# Patient Record
Sex: Female | Born: 1975
Health system: Southern US, Community
[De-identification: ages and names within clinical notes are randomized; demographics above are authoritative.]

## PROBLEM LIST (undated history)

## (undated) DIAGNOSIS — D649 Anemia, unspecified: Secondary | ICD-10-CM

## (undated) DIAGNOSIS — K219 Gastro-esophageal reflux disease without esophagitis: Secondary | ICD-10-CM

## (undated) DIAGNOSIS — O24419 Gestational diabetes mellitus in pregnancy, unspecified control: Secondary | ICD-10-CM

## (undated) DIAGNOSIS — J45909 Unspecified asthma, uncomplicated: Secondary | ICD-10-CM

## (undated) DIAGNOSIS — Z9109 Other allergy status, other than to drugs and biological substances: Secondary | ICD-10-CM

## (undated) DIAGNOSIS — E119 Type 2 diabetes mellitus without complications: Secondary | ICD-10-CM

## (undated) HISTORY — PX: THUMB FUSION: SUR636

## (undated) HISTORY — DX: Unspecified asthma, uncomplicated: J45.909

---

## 2001-08-13 HISTORY — PX: BREAST SURGERY: SHX581

## 2005-12-18 ENCOUNTER — Ambulatory Visit (HOSPITAL_COMMUNITY): Admission: RE | Admit: 2005-12-18 | Discharge: 2005-12-18 | Payer: Self-pay | Admitting: Obstetrics

## 2006-05-24 ENCOUNTER — Inpatient Hospital Stay (HOSPITAL_COMMUNITY): Admission: AD | Admit: 2006-05-24 | Discharge: 2006-05-27 | Payer: Self-pay | Admitting: Obstetrics

## 2006-05-24 ENCOUNTER — Inpatient Hospital Stay (HOSPITAL_COMMUNITY): Admission: AD | Admit: 2006-05-24 | Discharge: 2006-05-24 | Payer: Self-pay | Admitting: Obstetrics

## 2007-10-06 ENCOUNTER — Ambulatory Visit (HOSPITAL_COMMUNITY): Admission: RE | Admit: 2007-10-06 | Discharge: 2007-10-06 | Payer: Self-pay | Admitting: Obstetrics

## 2008-01-23 ENCOUNTER — Inpatient Hospital Stay (HOSPITAL_COMMUNITY): Admission: AD | Admit: 2008-01-23 | Discharge: 2008-01-25 | Payer: Self-pay | Admitting: Obstetrics

## 2011-05-10 LAB — CBC
HCT: 31.8 — ABNORMAL LOW
HCT: 37.9
Hemoglobin: 10.6 — ABNORMAL LOW
Hemoglobin: 12.7
MCHC: 33.2
MCV: 69.6 — ABNORMAL LOW
Platelets: 176
WBC: 6.1

## 2012-11-08 ENCOUNTER — Emergency Department (HOSPITAL_BASED_OUTPATIENT_CLINIC_OR_DEPARTMENT_OTHER): Payer: Worker's Compensation

## 2012-11-08 ENCOUNTER — Encounter (HOSPITAL_BASED_OUTPATIENT_CLINIC_OR_DEPARTMENT_OTHER): Payer: Self-pay | Admitting: *Deleted

## 2012-11-08 ENCOUNTER — Emergency Department (HOSPITAL_BASED_OUTPATIENT_CLINIC_OR_DEPARTMENT_OTHER)
Admission: EM | Admit: 2012-11-08 | Discharge: 2012-11-08 | Disposition: A | Discharge status: Home / Self Care | Payer: Worker's Compensation | Attending: Emergency Medicine | Admitting: Emergency Medicine

## 2012-11-08 DIAGNOSIS — S0093XA Contusion of unspecified part of head, initial encounter: Secondary | ICD-10-CM

## 2012-11-08 DIAGNOSIS — Y9389 Activity, other specified: Secondary | ICD-10-CM | POA: Insufficient documentation

## 2012-11-08 DIAGNOSIS — S5000XA Contusion of unspecified elbow, initial encounter: Secondary | ICD-10-CM | POA: Insufficient documentation

## 2012-11-08 DIAGNOSIS — S5002XA Contusion of left elbow, initial encounter: Secondary | ICD-10-CM

## 2012-11-08 DIAGNOSIS — Y9289 Other specified places as the place of occurrence of the external cause: Secondary | ICD-10-CM | POA: Insufficient documentation

## 2012-11-08 DIAGNOSIS — Y990 Civilian activity done for income or pay: Secondary | ICD-10-CM | POA: Insufficient documentation

## 2012-11-08 DIAGNOSIS — S0003XA Contusion of scalp, initial encounter: Secondary | ICD-10-CM | POA: Insufficient documentation

## 2012-11-08 DIAGNOSIS — W08XXXA Fall from other furniture, initial encounter: Secondary | ICD-10-CM | POA: Insufficient documentation

## 2012-11-08 MED ORDER — IBUPROFEN 800 MG PO TABS
ORAL_TABLET | ORAL | Status: AC
  Administered 2012-11-08: 800 mg
  Filled 2012-11-08: qty 1

## 2012-11-08 MED ORDER — IBUPROFEN 800 MG PO TABS: 800.0000 mg | ORAL_TABLET | Freq: Three times a day (TID) | ORAL | Status: DC

## 2012-11-08 NOTE — ED Provider Notes (Signed)
History     CSN: [id number]  Arrival date & time 11/08/12  1752   First MD Initiated Contact with Patient 11/08/12 1827      Chief Complaint  Patient presents with  . Fall    (Consider location/radiation/quality/duration/timing/severity/associated sxs/prior treatment) Patient is a 37 y.o. female presenting with fall. The history is provided by the patient. No language interpreter was used.  Fall The accident occurred less than 1 hour ago. The fall occurred from a stool. She fell from a height of 1 to 2 ft. She landed on a hard floor. There was no blood loss. The point of impact was the head and left elbow. The pain is present in the head and left elbow. The pain is at a severity of 4/10. The pain is mild. She was not ambulatory at the scene. There was no entrapment after the fall. Pertinent negatives include no visual change, no numbness, no nausea, no headaches, no hearing loss and no loss of consciousness. The symptoms are aggravated by activity. She has tried nothing for the symptoms.  Pt reports she fell off of a stool and hit her head.  No loc.    History reviewed. No pertinent past medical history.  History reviewed. No pertinent past surgical history.  History reviewed. No pertinent family history.  History  Substance Use Topics  . Smoking status: Never Smoker   . Smokeless tobacco: Not on file  . Alcohol Use: No    OB History   Grav Para Term Preterm Abortions TAB SAB Ect Mult Living                  Review of Systems  Gastrointestinal: Negative for nausea.  Neurological: Negative for loss of consciousness, numbness and headaches.  All other systems reviewed and are negative.    Allergies  Review of patient's allergies indicates not on file.  Home Medications  No current outpatient prescriptions on file.  BP 129/68  Pulse 60  Temp(Src) 98.3 F (36.8 C) (Oral)  Resp 20  Ht 5\' 7"  (1.702 m)  Wt 261 lb (118.389 kg)  BMI 40.87 kg/m2  SpO2 100%  LMP  10/18/2012  Physical Exam  Nursing note and vitals reviewed. Constitutional: She appears well-developed and well-nourished.  HENT:  Head: Normocephalic.  Right Ear: External ear normal.  Left Ear: External ear normal.  Eyes: Conjunctivae and EOM are normal. Pupils are equal, round, and reactive to light.  Neck: Normal range of motion. Neck supple.  Cardiovascular: Normal rate, regular rhythm and normal heart sounds.   Pulmonary/Chest: Effort normal and breath sounds normal.  Abdominal: Soft.  Musculoskeletal: She exhibits tenderness.  Tender right elbow.   From,  nv and ns intact  Neurological: She is alert.  Skin: Skin is warm.    ED Course  Procedures (including critical care time)  Labs Reviewed - No data to display Dg Elbow Complete Left  11/08/2012  *RADIOLOGY REPORT*  Clinical Data: Left elbow injury post fall, posterior pain  LEFT ELBOW - COMPLETE 3+ VIEW  Comparison: None.  Findings: Osseous mineralization normal. Joint spaces preserved. No acute fracture, dislocation or bone destruction. No elbow joint effusion. Regional soft tissues unremarkable.  IMPRESSION: No acute osseous abnormalities.   Original Report Authenticated By: Ulyses Southward, M.D.      No diagnosis found.    MDM  Pt advised of xray results.   Pt advised to see Dr. Pearletha Forge for recheck in 1 week.   Ice to areas of pain.  Pt given rx for ibuprofen         Elson Areas, [place] 11/08/12 2113

## 2012-11-08 NOTE — ED Notes (Signed)
Pt states she fell off a stool earlier today while at work and hit her head and back as well as her left elbow and right shoulder. No LOC. PERL

## 2012-11-09 NOTE — ED Provider Notes (Signed)
Medical screening examination/treatment/procedure(s) were performed by non-physician practitioner and as supervising physician I was immediately available for consultation/collaboration.   Carleene Cooper III, MD 11/09/12 [phone]

## 2015-11-15 ENCOUNTER — Encounter: Payer: Self-pay | Admitting: Obstetrics

## 2015-11-15 ENCOUNTER — Ambulatory Visit (HOSPITAL_COMMUNITY)
Admission: RE | Admit: 2015-11-15 | Discharge: 2015-11-15 | Disposition: A | Discharge status: Home / Self Care | Payer: 59 | Source: Ambulatory Visit | Attending: Obstetrics | Admitting: Obstetrics

## 2015-11-15 ENCOUNTER — Ambulatory Visit (INDEPENDENT_AMBULATORY_CARE_PROVIDER_SITE_OTHER): Payer: 59 | Admitting: Obstetrics

## 2015-11-15 VITALS — BP 111/80 | HR 84 | Temp 98.5°F | Wt 295.0 lb

## 2015-11-15 DIAGNOSIS — Z36 Encounter for antenatal screening of mother: Secondary | ICD-10-CM | POA: Diagnosis not present

## 2015-11-15 DIAGNOSIS — O3680X1 Pregnancy with inconclusive fetal viability, fetus 1: Secondary | ICD-10-CM

## 2015-11-15 DIAGNOSIS — Z3A01 Less than 8 weeks gestation of pregnancy: Secondary | ICD-10-CM | POA: Diagnosis not present

## 2015-11-15 DIAGNOSIS — O208 Other hemorrhage in early pregnancy: Secondary | ICD-10-CM | POA: Insufficient documentation

## 2015-11-15 NOTE — Progress Notes (Signed)
Patient is unsure of her LMP

## 2015-11-16 ENCOUNTER — Telehealth: Payer: Self-pay | Admitting: *Deleted

## 2015-11-16 LAB — BETA HCG QUANT (REF LAB): hCG Quant: 18875 m[IU]/mL

## 2015-11-16 LAB — SPECIMEN STATUS REPORT

## 2015-11-16 NOTE — Telephone Encounter (Signed)
Patient called and is requesting FMLA paperwork filled out for when she feels sick due to her pregnancy. She is complaining of dizziness associated with the pregnancy. LM on VM- will discuss with her provider- but should be able to cover some pregnancy illness during the month.

## 2015-11-17 ENCOUNTER — Other Ambulatory Visit: Payer: 59

## 2015-11-17 DIAGNOSIS — O3680X1 Pregnancy with inconclusive fetal viability, fetus 1: Secondary | ICD-10-CM

## 2015-11-17 LAB — PRENATAL PROFILE I(LABCORP)
ANTIBODY SCREEN: NEGATIVE
BASOS: 0 %
Basophils Absolute: 0 10*3/uL (ref 0.0–0.2)
EOS (ABSOLUTE): 0.1 10*3/uL (ref 0.0–0.4)
Eos: 1 %
HEMATOCRIT: 43.8 % (ref 34.0–46.6)
HEMOGLOBIN: 14.2 g/dL (ref 11.1–15.9)
Hepatitis B Surface Ag: NEGATIVE
Immature Grans (Abs): 0 10*3/uL (ref 0.0–0.1)
Immature Granulocytes: 0 %
Lymphocytes Absolute: 2.1 10*3/uL (ref 0.7–3.1)
Lymphs: 29 %
MCH: 22.6 pg — AB (ref 26.6–33.0)
MCHC: 32.4 g/dL (ref 31.5–35.7)
MCV: 70 fL — AB (ref 79–97)
MONOS ABS: 0.5 10*3/uL (ref 0.1–0.9)
Monocytes: 7 %
NEUTROS ABS: 4.4 10*3/uL (ref 1.4–7.0)
Neutrophils: 63 %
Platelets: 322 10*3/uL (ref 150–379)
RBC: 6.28 x10E6/uL — ABNORMAL HIGH (ref 3.77–5.28)
RDW: 14.7 % (ref 12.3–15.4)
RPR: NONREACTIVE
RUBELLA: 6.65 {index} (ref >0.99)
Rh Factor: POSITIVE
WBC: 7.1 10*3/uL (ref 3.4–10.8)

## 2015-11-17 LAB — HIV ANTIBODY (ROUTINE TESTING W REFLEX): HIV SCREEN 4TH GENERATION: NONREACTIVE

## 2015-11-17 LAB — VITAMIN D 25 HYDROXY (VIT D DEFICIENCY, FRACTURES): Vit D, 25-Hydroxy: 19.6 ng/mL — ABNORMAL LOW (ref 30.0–100.0)

## 2015-11-17 LAB — HEMOGLOBINOPATHY EVALUATION
HEMOGLOBIN A2 QUANTITATION: 4.6 % — AB (ref 0.7–3.1)
HGB A: 68.2 % — AB (ref 94.0–98.0)
HGB C: 0 %
HGB S: 27.2 % — ABNORMAL HIGH
Hemoglobin F Quantitation: 0 % (ref 0.0–2.0)

## 2015-11-17 LAB — VARICELLA ZOSTER ANTIBODY, IGG: VARICELLA: 847 {index} (ref >165)

## 2015-11-17 LAB — CULTURE, OB URINE

## 2015-11-17 LAB — URINE CULTURE, OB REFLEX

## 2015-11-18 ENCOUNTER — Other Ambulatory Visit: Payer: Self-pay | Admitting: Obstetrics

## 2015-11-18 LAB — BETA HCG QUANT (REF LAB): hCG Quant: 14680 m[IU]/mL

## 2015-11-21 ENCOUNTER — Encounter: Payer: Self-pay | Admitting: Obstetrics

## 2015-11-21 NOTE — Progress Notes (Signed)
Subjective:    Cheryl Acosta is being seen today for her first obstetrical visit.  This is a planned pregnancy. She is at [redacted]w[redacted]d gestation. Her obstetrical history is significant for advanced maternal age. Relationship with FOB: significant other, not living together. Patient does intend to breast feed. Pregnancy history fully reviewed.  The information documented in the HPI was reviewed and verified.  Menstrual History: OB History    Gravida Para Term Preterm AB TAB SAB Ectopic Multiple Living   3 2 2       2       Patient's last menstrual period was 08/23/2015.    History reviewed. No pertinent past medical history.  History reviewed. No pertinent past surgical history.   (Not in a hospital admission) No Known Allergies  Social History  Substance Use Topics  . Smoking status: Never Smoker   . Smokeless tobacco: Not on file  . Alcohol Use: No    History reviewed. No pertinent family history.   Review of Systems Constitutional: negative for weight loss Gastrointestinal: negative for vomiting Genitourinary:negative for genital lesions and vaginal discharge and dysuria Musculoskeletal:negative for back pain Behavioral/Psych: negative for abusive relationship, depression, illegal drug usage and tobacco use    Objective:    BP 111/80 mmHg  Pulse 84  Temp(Src) 98.5 F (36.9 C)  Wt 295 lb (133.811 kg)  LMP 08/23/2015 General Appearance:    Alert, cooperative, no distress, appears stated age  Head:    Normocephalic, without obvious abnormality, atraumatic  Eyes:    PERRL, conjunctiva/corneas clear, EOM's intact, fundi    benign, both eyes  Ears:    Normal TM's and external ear canals, both ears  Nose:   Nares normal, septum midline, mucosa normal, no drainage    or sinus tenderness  Throat:   Lips, mucosa, and tongue normal; teeth and gums normal  Neck:   Supple, symmetrical, trachea midline, no adenopathy;    thyroid:  no enlargement/tenderness/nodules; no carotid   bruit  or JVD  Back:     Symmetric, no curvature, ROM normal, no CVA tenderness  Lungs:     Clear to auscultation bilaterally, respirations unlabored  Chest Wall:    No tenderness or deformity   Heart:    Regular rate and rhythm, S1 and S2 normal, no murmur, rub   or gallop  Breast Exam:    No tenderness, masses, or nipple abnormality  Abdomen:     Soft, non-tender, bowel sounds active all four quadrants,    no masses, no organomegaly  Genitalia:    Normal female without lesion, discharge or tenderness  Extremities:   Extremities normal, atraumatic, no cyanosis or edema  Pulses:   2+ and symmetric all extremities  Skin:   Skin color, texture, turgor normal, no rashes or lesions  Lymph nodes:   Cervical, supraclavicular, and axillary nodes normal  Neurologic:   CNII-XII intact, normal strength, sensation and reflexes    throughout      Lab Review Urine pregnancy test Labs reviewed yes Radiologic studies reviewed yes Assessment:    Pregnancy at [redacted]w[redacted]d weeks    Plan:      Prenatal vitamins.  Counseling provided regarding continued use of seat belts, cessation of alcohol consumption, smoking or use of illicit drugs; infection precautions i.e., influenza/TDAP immunizations, toxoplasmosis,CMV, parvovirus, listeria and varicella; workplace safety, exercise during pregnancy; routine dental care, safe medications, sexual activity, hot tubs, saunas, pools, travel, caffeine use, fish and methlymercury, potential toxins, hair treatments, varicose veins Weight gain recommendations  per IOM guidelines reviewed: underweight/BMI< 18.5--> gain 28 - 40 lbs; normal weight/BMI 18.5 - 24.9--> gain 25 - 35 lbs; overweight/BMI 25 - 29.9--> gain 15 - 25 lbs; obese/BMI >30->gain  11 - 20 lbs Problem list reviewed and updated. FIRST/CF mutation testing/NIPT/QUAD SCREEN/fragile X/Ashkenazi Jewish population testing/Spinal muscular atrophy discussed: requested. Role of ultrasound in pregnancy discussed; fetal survey:  requested. Amniocentesis discussed: declined. VBAC calculator score: VBAC consent form provided No orders of the defined types were placed in this encounter.   Orders Placed This Encounter  Procedures  . Culture, OB Urine  . Result  . US OB Comp Less 14 Wks    Standing Status: Future     Number of Occurrences: 1     Standing Expiration Date: 01/14/2017    Order Specific Question:  Reason for Exam (SYMPTOM  OR DIAGNOSIS REQUIRED)    Answer:  Viability    Order Specific Question:  Preferred imaging location?    Answer:  [facility]  . US OB Transvaginal    Standing Status: Future     Number of Occurrences: 1     Standing Expiration Date: 01/14/2017    Order Specific Question:  Reason for Exam (SYMPTOM  OR DIAGNOSIS REQUIRED)    Answer:  viability    Order Specific Question:  Preferred imaging location?    Answer:  [facility] evaluation  . HIV antibody  . Varicella zoster antibody, IgG  . VITAMIN D 25 Hydroxy (Vit-D Deficiency, Fractures)  . Prenatal Profile I  . B-HCG Quant  . B-HCG Quant    Standing Status: Future     Number of Occurrences: 1     Standing Expiration Date: 11/14/2016  . Beta HCG, Quant  . Specimen status report    Follow up in 4 weeks.

## 2015-11-29 ENCOUNTER — Encounter: Payer: 59 | Admitting: Obstetrics

## 2015-11-30 ENCOUNTER — Ambulatory Visit (INDEPENDENT_AMBULATORY_CARE_PROVIDER_SITE_OTHER): Payer: 59 | Admitting: Obstetrics

## 2015-11-30 ENCOUNTER — Encounter: Payer: Self-pay | Admitting: Obstetrics

## 2015-11-30 VITALS — BP 113/80 | HR 70 | Temp 98.5°F | Wt 296.0 lb

## 2015-11-30 DIAGNOSIS — Z331 Pregnant state, incidental: Secondary | ICD-10-CM | POA: Diagnosis not present

## 2015-11-30 DIAGNOSIS — Z3481 Encounter for supervision of other normal pregnancy, first trimester: Secondary | ICD-10-CM | POA: Diagnosis not present

## 2015-11-30 DIAGNOSIS — O3680X1 Pregnancy with inconclusive fetal viability, fetus 1: Secondary | ICD-10-CM

## 2015-11-30 DIAGNOSIS — Z1389 Encounter for screening for other disorder: Secondary | ICD-10-CM

## 2015-11-30 LAB — POCT URINALYSIS DIPSTICK
BILIRUBIN UA: NEGATIVE
Glucose, UA: NEGATIVE
KETONES UA: NEGATIVE
Nitrite, UA: NEGATIVE
PROTEIN UA: NEGATIVE
SPEC GRAV UA: 1.02
Urobilinogen, UA: NEGATIVE
pH, UA: 5

## 2015-11-30 NOTE — Progress Notes (Signed)
Pt states that her LMP of 09-23-15, not 08-23-15 as previously noted.

## 2015-11-30 NOTE — Progress Notes (Signed)
  Subjective:    Cheryl Acosta is a 40 y.o. female being seen today for her obstetrical visit. She is at [redacted]w[redacted]d gestation. Patient reports: no complaints.  Problem List Items Addressed This Visit    None    Visit Diagnoses    Encounter for supervision of other normal pregnancy in first trimester    -  Primary    Relevant Orders    POCT urinalysis dipstick (Completed)    Encounter to determine fetal viability of pregnancy, fetus 1        Relevant Orders    US OB Transvaginal    US OB Follow Up      There are no active problems to display for this patient.   Objective:     BP 113/80 mmHg  Pulse 70  Temp(Src) 98.5 F (36.9 C)  Wt 296 lb (134.265 kg)  LMP 09/23/2015 Uterine Size: Below umbilicus     Assessment:    Pregnancy @ [redacted]w[redacted]d  weeks   Plan:    Problem list reviewed and updated. Labs reviewed. Ultrasound ordered  Follow up in 1 weeks.  Role of ultrasound in pregnancy discussed; fetal survey: Requested

## 2015-12-01 ENCOUNTER — Ambulatory Visit (HOSPITAL_COMMUNITY)
Admission: RE | Admit: 2015-12-01 | Discharge: 2015-12-01 | Disposition: A | Discharge status: Home / Self Care | Payer: 59 | Source: Ambulatory Visit | Attending: Obstetrics | Admitting: Obstetrics

## 2015-12-01 ENCOUNTER — Ambulatory Visit (INDEPENDENT_AMBULATORY_CARE_PROVIDER_SITE_OTHER): Payer: 59 | Admitting: Obstetrics

## 2015-12-01 VITALS — Wt 296.0 lb

## 2015-12-01 DIAGNOSIS — Z36 Encounter for antenatal screening of mother: Secondary | ICD-10-CM | POA: Diagnosis present

## 2015-12-01 DIAGNOSIS — Z3169 Encounter for other general counseling and advice on procreation: Secondary | ICD-10-CM

## 2015-12-01 DIAGNOSIS — O021 Missed abortion: Secondary | ICD-10-CM | POA: Diagnosis not present

## 2015-12-01 DIAGNOSIS — O3680X1 Pregnancy with inconclusive fetal viability, fetus 1: Secondary | ICD-10-CM

## 2015-12-01 DIAGNOSIS — Z3A01 Less than 8 weeks gestation of pregnancy: Secondary | ICD-10-CM | POA: Diagnosis not present

## 2015-12-01 MED ORDER — VITAFOL-NANO 18-0.6-0.4 MG PO TABS: 1.0000 | ORAL_TABLET | Freq: Every day | ORAL | Status: DC

## 2015-12-01 NOTE — Progress Notes (Signed)
Patient had viability U/S. Here to review with Dr. Jodi Mourning

## 2015-12-02 ENCOUNTER — Encounter: Payer: Self-pay | Admitting: Obstetrics

## 2015-12-02 NOTE — Progress Notes (Signed)
S:  Patient returns for results of ultrasound O:  Ultrasound reveals ~ 6 week IUP with absent cardiac activity and growth 15 days apart  A:  Failed IUP. P:  Discussed options, including D&C, Cytotec and expectant management and patient desires expectant management.      F/U in 2 weeks  Charles A. Jodi Mourning MD

## 2015-12-05 ENCOUNTER — Other Ambulatory Visit: Payer: Self-pay | Admitting: *Deleted

## 2015-12-05 ENCOUNTER — Telehealth: Payer: Self-pay

## 2015-12-05 ENCOUNTER — Encounter (HOSPITAL_COMMUNITY): Payer: Self-pay | Admitting: *Deleted

## 2015-12-05 NOTE — Telephone Encounter (Signed)
LET PATIENT KNOW THAT HER FMLA PAPERWORK IS READY AND THERE IS A 15.00 CHARGE - SHE CAN PAY OVER PHONE OR PAY WHEN SHE PICKS UP

## 2015-12-07 ENCOUNTER — Telehealth: Payer: Self-pay

## 2015-12-07 DIAGNOSIS — Z029 Encounter for administrative examinations, unspecified: Secondary | ICD-10-CM

## 2015-12-07 NOTE — Telephone Encounter (Signed)
PATIENT PAID FOR FMLA PAPERWORK OVER PHONE - FAXED TO HER JOB  TODAY

## 2015-12-12 ENCOUNTER — Encounter (HOSPITAL_COMMUNITY): Payer: Self-pay | Admitting: *Deleted

## 2015-12-12 ENCOUNTER — Ambulatory Visit (HOSPITAL_COMMUNITY): Payer: 59 | Admitting: Anesthesiology

## 2015-12-12 ENCOUNTER — Encounter (HOSPITAL_COMMUNITY)
Admission: AD | Disposition: A | Discharge status: Home / Self Care | Payer: Self-pay | Source: Ambulatory Visit | Attending: Obstetrics

## 2015-12-12 ENCOUNTER — Ambulatory Visit (HOSPITAL_COMMUNITY)
Admission: AD | Admit: 2015-12-12 | Discharge: 2015-12-12 | Disposition: A | Discharge status: Home / Self Care | Payer: 59 | Source: Ambulatory Visit | Attending: Obstetrics | Admitting: Obstetrics

## 2015-12-12 DIAGNOSIS — Z6841 Body Mass Index (BMI) 40.0 and over, adult: Secondary | ICD-10-CM | POA: Diagnosis not present

## 2015-12-12 DIAGNOSIS — O021 Missed abortion: Secondary | ICD-10-CM | POA: Insufficient documentation

## 2015-12-12 HISTORY — PX: DILATION AND EVACUATION: SHX1459

## 2015-12-12 SURGERY — DILATION AND EVACUATION, UTERUS
Anesthesia: Monitor Anesthesia Care | Site: Vagina

## 2015-12-12 MED ORDER — DEXAMETHASONE SODIUM PHOSPHATE 10 MG/ML IJ SOLN
INTRAMUSCULAR | Status: AC
  Filled 2015-12-12: qty 1

## 2015-12-12 MED ORDER — MIDAZOLAM HCL 2 MG/2ML IJ SOLN
INTRAMUSCULAR | Status: DC | PRN
  Administered 2015-12-12: 2 mg via INTRAVENOUS

## 2015-12-12 MED ORDER — FENTANYL CITRATE (PF) 100 MCG/2ML IJ SOLN
INTRAMUSCULAR | Status: AC
  Filled 2015-12-12: qty 2

## 2015-12-12 MED ORDER — ONDANSETRON HCL 4 MG/2ML IJ SOLN
INTRAMUSCULAR | Status: DC | PRN
  Administered 2015-12-12: 4 mg via INTRAVENOUS

## 2015-12-12 MED ORDER — GLYCOPYRROLATE 0.2 MG/ML IJ SOLN
INTRAMUSCULAR | Status: AC
  Filled 2015-12-12: qty 1

## 2015-12-12 MED ORDER — PROPOFOL 10 MG/ML IV BOLUS
INTRAVENOUS | Status: AC
  Filled 2015-12-12: qty 20

## 2015-12-12 MED ORDER — KETOROLAC TROMETHAMINE 30 MG/ML IJ SOLN
INTRAMUSCULAR | Status: DC | PRN
  Administered 2015-12-12: 30 mg via INTRAVENOUS

## 2015-12-12 MED ORDER — IBUPROFEN 800 MG PO TABS: 800.0000 mg | ORAL_TABLET | Freq: Three times a day (TID) | ORAL | Status: DC | PRN

## 2015-12-12 MED ORDER — PROPOFOL 500 MG/50ML IV EMUL: INTRAVENOUS | Status: DC | PRN

## 2015-12-12 MED ORDER — LIDOCAINE HCL (CARDIAC) 20 MG/ML IV SOLN
INTRAVENOUS | Status: AC
  Filled 2015-12-12: qty 5

## 2015-12-12 MED ORDER — OXYTOCIN 40 UNITS IN LACTATED RINGERS INFUSION - SIMPLE MED
INTRAVENOUS | Status: DC | PRN
  Administered 2015-12-12: 40 mL via INTRAVENOUS

## 2015-12-12 MED ORDER — FENTANYL CITRATE (PF) 100 MCG/2ML IJ SOLN
INTRAMUSCULAR | Status: DC | PRN
  Administered 2015-12-12: 100 ug via INTRAVENOUS

## 2015-12-12 MED ORDER — ONDANSETRON HCL 4 MG/2ML IJ SOLN: 4.0000 mg | Freq: Once | INTRAMUSCULAR | Status: DC

## 2015-12-12 MED ORDER — PROPOFOL 500 MG/50ML IV EMUL
INTRAVENOUS | Status: DC | PRN
  Administered 2015-12-12: 100 mg via INTRAVENOUS

## 2015-12-12 MED ORDER — FENTANYL CITRATE (PF) 100 MCG/2ML IJ SOLN
25.0000 ug | INTRAMUSCULAR | Status: DC | PRN
  Administered 2015-12-12 (×2): 25 ug via INTRAVENOUS
  Administered 2015-12-12: 50 ug via INTRAVENOUS

## 2015-12-12 MED ORDER — GLYCOPYRROLATE 0.2 MG/ML IJ SOLN
INTRAMUSCULAR | Status: DC | PRN
  Administered 2015-12-12: 0.2 mg via INTRAVENOUS

## 2015-12-12 MED ORDER — OXYTOCIN 10 UNIT/ML IJ SOLN
INTRAMUSCULAR | Status: AC
  Filled 2015-12-12: qty 4

## 2015-12-12 MED ORDER — LIDOCAINE HCL (CARDIAC) 20 MG/ML IV SOLN
INTRAVENOUS | Status: DC | PRN
  Administered 2015-12-12: 50 mg via INTRAVENOUS

## 2015-12-12 MED ORDER — SCOPOLAMINE 1 MG/3DAYS TD PT72: 1.0000 | MEDICATED_PATCH | Freq: Once | TRANSDERMAL | Status: DC

## 2015-12-12 MED ORDER — MIDAZOLAM HCL 2 MG/2ML IJ SOLN
INTRAMUSCULAR | Status: AC
  Filled 2015-12-12: qty 2

## 2015-12-12 MED ORDER — OXYTOCIN 10 UNIT/ML IJ SOLN: INTRAMUSCULAR | Status: DC | PRN

## 2015-12-12 MED ORDER — LACTATED RINGERS IV SOLN
INTRAVENOUS | Status: DC
  Administered 2015-12-12 (×2): via INTRAVENOUS

## 2015-12-12 MED ORDER — SILVER NITRATE-POT NITRATE 75-25 % EX MISC
CUTANEOUS | Status: DC | PRN
  Administered 2015-12-12: 2

## 2015-12-12 MED ORDER — PROPOFOL 500 MG/50ML IV EMUL
INTRAVENOUS | Status: DC | PRN
  Administered 2015-12-12: 100 ug/kg/min via INTRAVENOUS

## 2015-12-12 MED ORDER — LIDOCAINE HCL 1 % IJ SOLN
INTRAMUSCULAR | Status: AC
  Filled 2015-12-12: qty 20

## 2015-12-12 MED ORDER — ONDANSETRON HCL 4 MG/2ML IJ SOLN
INTRAMUSCULAR | Status: AC
  Filled 2015-12-12: qty 2

## 2015-12-12 MED ORDER — LIDOCAINE HCL 1 % IJ SOLN
INTRAMUSCULAR | Status: DC | PRN
  Administered 2015-12-12: 20 mL

## 2015-12-12 SURGICAL SUPPLY — 20 items
CATH ROBINSON RED A/P 16FR (CATHETERS) ×3 IMPLANT
CLOTH BEACON ORANGE TIMEOUT ST (SAFETY) ×3 IMPLANT
DECANTER SPIKE VIAL GLASS SM (MISCELLANEOUS) ×3 IMPLANT
GLOVE BIO SURGEON STRL SZ8 (GLOVE) ×6 IMPLANT
GLOVE BIOGEL PI IND STRL 7.0 (GLOVE) ×1 IMPLANT
GLOVE BIOGEL PI INDICATOR 7.0 (GLOVE) ×2
GOWN STRL REUS W/TWL LRG LVL3 (GOWN DISPOSABLE) ×6 IMPLANT
KIT BERKELEY 1ST TRIMESTER 3/8 (MISCELLANEOUS) ×3 IMPLANT
NEEDLE HYPO 22GX1.5 SAFETY (NEEDLE) ×3 IMPLANT
NS IRRIG 1000ML POUR BTL (IV SOLUTION) ×3 IMPLANT
PACK VAGINAL MINOR WOMEN LF (CUSTOM PROCEDURE TRAY) ×3 IMPLANT
PAD OB MATERNITY 4.3X12.25 (PERSONAL CARE ITEMS) ×3 IMPLANT
PAD PREP 24X48 CUFFED NSTRL (MISCELLANEOUS) ×3 IMPLANT
SET BERKELEY SUCTION TUBING (SUCTIONS) ×3 IMPLANT
TOWEL OR 17X24 6PK STRL BLUE (TOWEL DISPOSABLE) ×6 IMPLANT
VACURETTE 10 RIGID CVD (CANNULA) IMPLANT
VACURETTE 12 RIGID CVD (CANNULA) ×3 IMPLANT
VACURETTE 7MM CVD STRL WRAP (CANNULA) IMPLANT
VACURETTE 8 RIGID CVD (CANNULA) ×2 IMPLANT
VACURETTE 9 RIGID CVD (CANNULA) IMPLANT

## 2015-12-12 NOTE — Op Note (Signed)
Preoperative diagnosis:Missed Abortion, 1st Trimester  Postoperative diagnosis: Same  Procedure: Suction dilatation and curretage Surgeon: Lindzie Boxx A  Anesthesia:  MAC with Paracervical Block of 1% Xylocaine   Estimated blood loss: 174ml  Urine output: 46ml ( clear )  IV Fluids: 123XX123  Complications: None  Specimen: PATHOLOGY - POC  Operative Findings: POC  Suction D&C  Description of procedure:   The patient was taken to the operating room and placed on the operating table in the semi-lithotomy position in [place].  Examination under anesthesia was performed.  The patient was prepped and draped in the usual manner.  After a time-out had been completed, a speculum was placed in the vagina.  The anterior lip of the cervix was grasped with a single-toothed tenaculum.  A paracervical block was performed using 10 ml of 1% lidocaine.  The block was performed at 4 and 8 o'clock at the cervical vaginal junction. The cervix was dilated to 69mm with [facility] dilators.  A 8 -mm suction curet was inserted in the uterine cavity.  The device was activated and the curet rotated to evacuate the products of conception.   All the instruments were removed from the vagina.  Final instrument counts were correct.  The patient was taken to the PACU in stable condition.

## 2015-12-12 NOTE — Anesthesia Preprocedure Evaluation (Signed)
Anesthesia Evaluation  Patient identified by MRN, date of birth, ID band Patient awake    Reviewed: Allergy & Precautions, NPO status , Patient's Chart, lab work & pertinent test results  Airway Mallampati: III  TM Distance: >3 FB Neck ROM: Full    Dental no notable dental hx. (+) Teeth Intact   Pulmonary  Reactive airways   Pulmonary exam normal breath sounds clear to auscultation       Cardiovascular negative cardio ROS Normal cardiovascular exam Rhythm:Regular Rate:Normal     Neuro/Psych negative neurological ROS  negative psych ROS   GI/Hepatic negative GI ROS, Neg liver ROS,   Endo/Other  Morbid obesity  Renal/GU negative Renal ROS  negative genitourinary   Musculoskeletal negative musculoskeletal ROS (+)   Abdominal (+) + obese,   Peds  Hematology   Anesthesia Other Findings   Reproductive/Obstetrics (+) Pregnancy Missed Ab                             Anesthesia Physical Anesthesia Plan  ASA: III  Anesthesia Plan: MAC   Post-op Pain Management:    Induction:   Airway Management Planned: Natural Airway and Nasal Cannula  Additional Equipment:   Intra-op Plan:   Post-operative Plan:   Informed Consent: I have reviewed the patients History and Physical, chart, labs and discussed the procedure including the risks, benefits and alternatives for the proposed anesthesia with the patient or authorized representative who has indicated his/her understanding and acceptance.   Dental advisory given  Plan Discussed with: CRNA, Anesthesiologist and Surgeon  Anesthesia Plan Comments:         Anesthesia Quick Evaluation

## 2015-12-12 NOTE — H&P (Signed)
Cheryl Acosta is an 40 y.o. female. Ultrasound findings c/w Missed Abortion.  Patient presents for D&E.  Pertinent Gynecological History: Menses: flow is moderate Bleeding: normal Contraception: none DES exposure: denies Blood transfusions: none Sexually transmitted diseases: no past history Previous GYN Procedures: none  Last mammogram: none Date: none Last pap: normal Date: 2016 OB History: G3, P2   Menstrual History: Menarche age: 84  Patient's last menstrual period was 09/23/2015.    History reviewed. No pertinent past medical history.  Past Surgical History  Procedure Laterality Date  . Thumb fusion    . Breast surgery  2003    BILATERAL LUMPECTOMY     History reviewed. No pertinent family history.  Social History:  reports that she has never smoked. She does not have any smokeless tobacco history on file. She reports that she does not drink alcohol or use illicit drugs.  Allergies: No Known Allergies  Prescriptions prior to admission  Medication Sig Dispense Refill Last Dose  . acetaminophen (TYLENOL) 500 MG tablet Take 1,000 mg by mouth every 6 (six) hours as needed for mild pain or headache.     . albuterol (PROVENTIL HFA;VENTOLIN HFA) 108 (90 Base) MCG/ACT inhaler Inhale 2 puffs into the lungs every 6 (six) hours as needed for wheezing or shortness of breath.   rescue  . Chlorpheniramine Maleate (ALLERGY PO) Take 1 tablet by mouth daily as needed (allergies).       Review of Systems  All other systems reviewed and are negative.   Blood pressure 139/79, pulse 78, temperature 98.1 F (36.7 C), temperature source Oral, resp. rate 20, height 5\' 8"  (1.727 m), weight 296 lb (134.265 kg), last menstrual period 09/23/2015, SpO2 100 %. Physical Exam  Nursing note and vitals reviewed. Constitutional: She is oriented to person, place, and time. She appears well-developed and well-nourished.  HENT:  Head: Normocephalic and atraumatic.  Eyes: Conjunctivae are normal.  Pupils are equal, round, and reactive to light.  Neck: Normal range of motion. Neck supple.  Cardiovascular: Normal rate and regular rhythm.   Respiratory: Effort normal and breath sounds normal.  GI: Soft.  Genitourinary: Vagina normal and uterus normal.  Musculoskeletal: Normal range of motion.  Neurological: She is alert and oriented to person, place, and time.  Skin: Skin is warm and dry.  Psychiatric: She has a normal mood and affect. Her behavior is normal. Judgment and thought content normal.    No results found for this or any previous visit (from the past 24 hour(s)).  No results found.  Assessment/Plan: Missed Abortion.  Patient offered options of Cytotec, D&C or expectant management.  Patient elects to have D&C.  HARPER,CHARLES A 12/12/2015, 7:47 AM

## 2015-12-12 NOTE — Transfer of Care (Signed)
Immediate Anesthesia Transfer of Care Note  Patient: Cheryl Acosta  Procedure(s) Performed: Procedure(s): DILATATION AND EVACUATION (N/A)  Patient Location: PACU  Anesthesia Type:MAC  Level of Consciousness: awake, alert  and oriented  Airway & Oxygen Therapy: Patient Spontanous Breathing and Patient connected to nasal cannula oxygen  Post-op Assessment: Report given to RN, Post -op Vital signs reviewed and stable and Patient moving all extremities X 4  Post vital signs: Reviewed and stable  Last Vitals:  Filed Vitals:   12/12/15 0724 12/12/15 0940  BP: 139/79   Pulse:    Temp:  36.7 C  Resp:      Last Pain: There were no vitals filed for this visit.    Patients Stated Pain Goal: 3 ([id number])  Complications: No apparent anesthesia complications

## 2015-12-12 NOTE — Discharge Instructions (Signed)
D&C or Vacuum Curettage Care After Read the instructions below. Refer to this sheet in the next few weeks. These instructions provide you with general information on caring for yourself after you leave the hospital. Your caregiver may also give you specific instructions.  D&C or vacuum curettage is a minor operation. A D&C involves the stretching (dilatation) of the cervix and scraping (curettage) of the inside lining of the uterus. A vacuum curettage gently sucks out the lining and tissue in the uterus with a tube. You may have light cramping and bleeding for a couple of days to two weeks after the procedure. This procedure may be done in a hospital, outpatient clinic, or doctor's office. You may be given a drug to make you sleep (general anesthetic) or a drug that numbs the area (local anesthetic) in and around the cervix. HOME CARE INSTRUCTIONS  Do not drive for 24 hours.   Wait one week before returning to strenuous activities.   Take your temperature two times a day for 4 days and write it down. Provide these temperatures to your caregiver if they are abnormal (above 98.6 F or 37.0 C).   Avoid long periods of standing, and do no heavy lifting (more than 10 pounds), pushing or pulling.   Limit stair climbing to once or twice a day.   Take rest periods often.   You may resume your usual diet.   Drink plenty of fluids (6-8 glasses a day).   You should return to your usual bowel function. If constipation should occur, you may:   Take a mild laxative with permission from your caregiver.   Add fruit and bran to your diet.   Drink more fluids. This helps with constipation.   Take showers instead of baths until your caregiver gives you permission to take baths.   Do not go swimming or use a hot tub until your caregiver gives you permission.   Try to have someone with you or available for you the first 24 to 48 hours, especially if you had a general anesthetic.   Do not douche, use  tampons, or have intercourse until after your follow-up appointment, or when your caregiver approves.   Only take over-the-counter or prescription medicines for pain, discomfort, or fever as directed by your caregiver. Do not take aspirin. It can cause bleeding.   If a prescription was given, follow your caregiver's directions. You may be given a medicine that kills germs (antibiotic) to prevent an infection.   Keep all your follow-up appointments recommended by your caregiver.  SEEK MEDICAL CARE IF:  You have increasing cramps or pain not relieved with medication.   You develop belly (abdominal) pain which does not seem to be related to the same area of earlier cramping and pain.   You feel dizzy or feel like fainting.   You have bad smelling vaginal discharge.   You develop a rash.   You develop a reaction or allergy to your medication.  SEEK IMMEDIATE MEDICAL CARE IF:  Bleeding is heavier than a normal menstrual period.   You have an oral temperature above 100.6, not controlled by medicine.   You develop chest pain.   You develop shortness of breath.   You pass out.   You develop pain in your shoulder strap area.   You develop heavy vaginal bleeding with or without blood clots.  MAKE SURE YOU:   Understand these instructions.   Will watch your condition.   Will get help right away if  you are not doing well or get worse.  UPDATED HEALTH PRACTICES  A PAP smear is done to screen for cervical cancer.   The first PAP smear should be done at age 11.   Between ages 39 and 57, PAP smears are repeated every 2 years.   Beginning at age 20, you are advised to have a PAP smear every 3 years as long as your past 3 PAP smears have been normal.   Some women have medical problems that increase the chance of getting cervical cancer. Talk to your caregiver about these problems. It is especially important to talk to your caregiver if a new problem develops soon after your last PAP  smear. In these cases, your caregiver may recommend more frequent screening and Pap smears.   The above recommendations are the same for women who have or have not gotten the vaccine for HPV (Human Papillomavirus).   If you had a hysterectomy for a problem that was not a cancer or a condition that could lead to cancer, then you no longer need Pap smears.   If you are between ages 29 and 25, and you have had normal Pap smears going back 10 years, you no longer need Pap smears.   If you have had past treatment for cervical cancer or a condition that could lead to cancer, you need Pap smears and screening for cancer for at least 20 years after your treatment.   Continue monthly self-breast examinations. Your caregiver can provide information and instructions for self-breast examination.  Document Released: 07/27/2000 Document Re-Released: 01/17/2010 [facility] Patient Information 2011 [place].  May take ibuprofen starting 3:30pm 12/12/15.  Recovery unit [phone]

## 2015-12-13 ENCOUNTER — Encounter (HOSPITAL_COMMUNITY): Payer: Self-pay | Admitting: Obstetrics

## 2015-12-13 NOTE — Anesthesia Postprocedure Evaluation (Signed)
Anesthesia Post Note  Patient: Cheryl Acosta  Procedure(s) Performed: Procedure(s) (LRB): DILATATION AND EVACUATION (N/A)  Patient location during evaluation: PACU Anesthesia Type: General Level of consciousness: awake and alert and oriented Pain management: pain level controlled Vital Signs Assessment: post-procedure vital signs reviewed and stable Respiratory status: spontaneous breathing, nonlabored ventilation and respiratory function stable Cardiovascular status: blood pressure returned to baseline and stable Postop Assessment: no signs of nausea or vomiting Anesthetic complications: no    Last Vitals:  Filed Vitals:   12/12/15 1100 12/12/15 1145  BP: 125/76 114/72  Pulse: 69 70  Temp: 36.6 C 36.7 C  Resp: 18 20    Last Pain:  Filed Vitals:   12/12/15 1206  PainSc: 0-No pain                 Kailee Essman A.

## 2015-12-14 ENCOUNTER — Encounter: Payer: Self-pay | Admitting: *Deleted

## 2015-12-15 ENCOUNTER — Encounter: Payer: 59 | Admitting: Obstetrics

## 2015-12-26 ENCOUNTER — Encounter: Payer: Self-pay | Admitting: Obstetrics

## 2015-12-26 ENCOUNTER — Ambulatory Visit (INDEPENDENT_AMBULATORY_CARE_PROVIDER_SITE_OTHER): Payer: 59 | Admitting: Obstetrics

## 2015-12-26 ENCOUNTER — Encounter: Payer: Self-pay | Admitting: *Deleted

## 2015-12-26 VITALS — BP 146/89 | HR 73 | Temp 98.7°F | Wt 301.0 lb

## 2015-12-26 DIAGNOSIS — M545 Low back pain, unspecified: Secondary | ICD-10-CM

## 2015-12-26 DIAGNOSIS — O021 Missed abortion: Secondary | ICD-10-CM

## 2015-12-26 DIAGNOSIS — R300 Dysuria: Secondary | ICD-10-CM | POA: Diagnosis not present

## 2015-12-26 DIAGNOSIS — Z3169 Encounter for other general counseling and advice on procreation: Secondary | ICD-10-CM

## 2015-12-26 LAB — POCT URINALYSIS DIPSTICK
Bilirubin, UA: NEGATIVE
Blood, UA: NEGATIVE
Glucose, UA: NEGATIVE
NITRITE UA: NEGATIVE
Spec Grav, UA: <=1.005
UROBILINOGEN UA: NEGATIVE
pH, UA: 8

## 2015-12-26 MED ORDER — IBUPROFEN 800 MG PO TABS: 800.0000 mg | ORAL_TABLET | Freq: Three times a day (TID) | ORAL | Status: DC | PRN

## 2015-12-26 MED ORDER — PRENATAL PLUS IRON 29-1 MG PO TABS: 1.0000 | ORAL_TABLET | Freq: Every day | ORAL | Status: DC

## 2015-12-26 MED ORDER — CYCLOBENZAPRINE HCL 10 MG PO TABS: 10.0000 mg | ORAL_TABLET | Freq: Three times a day (TID) | ORAL | Status: DC | PRN

## 2015-12-26 NOTE — Progress Notes (Signed)
Patient ID: Cheryl Acosta, female   DOB: 25-Dec-1975, 40 y.o.   MRN: [id number]  Chief Complaint  Patient presents with  . Follow-up    Missed AB   . Back Pain    Dull Aching lower back pain -   . Dizziness    W/ back pain     HPI Cheryl Acosta is a 40 y.o. female.  C/O lower back pain.  HPI  No past medical history on file.  Past Surgical History  Procedure Laterality Date  . Thumb fusion    . Breast surgery  2003    BILATERAL LUMPECTOMY   . Dilation and evacuation N/A 12/12/2015    Procedure: DILATATION AND EVACUATION;  Surgeon: Shelly Bombard, MD;  Location: [facility];  Service: Gynecology;  Laterality: N/A;    No family history on file.  Social History Social History  Substance Use Topics  . Smoking status: Never Smoker   . Smokeless tobacco: None  . Alcohol Use: No    No Known Allergies  Current Outpatient Prescriptions  Medication Sig Dispense Refill  . acetaminophen (TYLENOL) 500 MG tablet Take 1,000 mg by mouth every 6 (six) hours as needed for mild pain or headache. Reported on 12/26/2015    . albuterol (PROVENTIL HFA;VENTOLIN HFA) 108 (90 Base) MCG/ACT inhaler Inhale 2 puffs into the lungs every 6 (six) hours as needed for wheezing or shortness of breath.    . Chlorpheniramine Maleate (ALLERGY PO) Take 1 tablet by mouth daily as needed (allergies).    [name] ibuprofen (ADVIL,MOTRIN) 800 MG tablet Take 1 tablet (800 mg total) by mouth every 8 (eight) hours as needed. 30 tablet 5  . Prenatal Vit-Iron Carbonyl-FA (PRENATAL PLUS IRON) 29-1 MG TABS Take 1 tablet by mouth daily before breakfast. 30 tablet 11   No current facility-administered medications for this visit.    Review of Systems Review of Systems Constitutional: negative for fatigue and weight loss Respiratory: negative for cough and wheezing Cardiovascular: negative for chest pain, fatigue and palpitations Gastrointestinal: negative for abdominal pain and change in bowel  habits Genitourinary:negative Integument/breast: negative for nipple discharge Musculoskeletal:positive for myalgias Neurological: negative for gait problems and tremors Behavioral/Psych: negative for abusive relationship, depression Endocrine: negative for temperature intolerance     Blood pressure 146/89, pulse 73, temperature 98.7 F (37.1 C), temperature source Oral, weight 301 lb (136.533 kg), last menstrual period 09/23/2015, unknown if currently breastfeeding.  Physical Exam Physical Exam            General:  Alert and no distress Abdomen:  normal findings: no organomegaly, soft, non-tender and no hernia  Pelvis:  External genitalia: normal general appearance Urinary system: urethral meatus normal and bladder without fullness, nontender Vaginal: normal without tenderness, induration or masses Cervix: normal appearance Adnexa: normal bimanual exam Uterus: anteverted and non-tender, normal size      Data Reviewed Pathology  Assessment     Missed Abortion.  S/P D&E.  Doing well.  Preconception counseling and advice. Backache    Plan    PNV's Rx  Flexeril Rx F/U 6 months.   Orders Placed This Encounter  Procedures  . POCT Urinalysis Dipstick   Meds ordered this encounter  Medications  . Prenatal Vit-Iron Carbonyl-FA (PRENATAL PLUS IRON) 29-1 MG TABS    Sig: Take 1 tablet by mouth daily before breakfast.    Dispense:  30 tablet    Refill:  11

## 2015-12-27 ENCOUNTER — Encounter: Payer: Self-pay | Admitting: Obstetrics

## 2015-12-28 LAB — URINE CULTURE

## 2016-01-02 ENCOUNTER — Ambulatory Visit: Payer: 59 | Admitting: Obstetrics

## 2016-01-03 ENCOUNTER — Institutional Professional Consult (permissible substitution): Payer: 59

## 2016-06-25 ENCOUNTER — Ambulatory Visit: Payer: Self-pay | Admitting: Obstetrics

## 2016-08-13 NOTE — L&D Delivery Note (Signed)
Patient is 41 y.o. [id number] [redacted]w[redacted]d admitted IOL GDMA1 and LGA ([redacted]w[redacted]d: 3757g, EFW>90% AC>97%, HC:AC 0.8). S/p IOL with foley bulb, cytotec, followed by Pitocin. SROM @2240  then AROM forebag @0013 .  Prenatal course also complicated by single elevated BP intrapartum.  Delivery Note At 12:41 AM a viable female was delivered via Vaginal, Spontaneous Delivery (Presentation: LOA with compound presentation of posterior arm).  APGAR: 7, 9; weight pending.   Placenta status: intact with 3 vessels.  Cord: tight nuchal x1 - delivered baby through cord.  Cord pH: N/A  Anesthesia:  Epidural Episiotomy: None Lacerations: 2nd degree;Perineal Suture Repair: 2.0 vicryl rapide Est. Blood Loss (mL): 100  Mom to postpartum.  Baby to Couplet care / Skin to Skin.  Upon arrival patient was complete and pushing. She pushed with good maternal effort to deliver a viable female infant in cephalic, LOA position with compound presentation of posterior arm. Tight nuchal cord x1 present, though baby delivered with easy through cord over shoulder. Anterior shoulder delivered with ease. Baby was noted to have good tone and placed on maternal abdomen for oral suctioning, drying and stimulation. Delayed cord clamping performed. Placenta delivered spontaneously with gentle cord traction. Fundus firm with massage and Pitocin. Perineum inspected and found to have 2nd degree perineal laceration, which was repaired with 2-0 Vicryl Rapide with good hemostasis achieved. Counts of sharps, instruments, and lap pads were all correct.  Julianne Handler, CNM, supervised delivery and repair.   Donnita Falls, MD Family Medicine Resident, PGY-3 04/30/2017 1:18 AM  Midwife attestation: I was gloved and present for delivery in its entirety and I agree with the above resident's note.  Julianne Handler, CNM 6:04 AM

## 2016-09-04 ENCOUNTER — Telehealth: Payer: Self-pay | Admitting: *Deleted

## 2016-09-04 NOTE — Telephone Encounter (Signed)
Pt called to office wanting to know if she needs early appt for NOB as she thinks she is high risk.  Return call to pt. Pt made aware that at this time she is only at increase risk due to age.  Pt made aware that she may still be scheduled at 10 weeks for NOB as she has negative medical history.  Pt advised that she may have some mild cramping. Pt advised if cramping becomes severe or she has bleeding she may be seen at [facility] or call office if needed.  Pt concerned since she had SAB in 2017. Pt advised of symptoms that would require urgent attention.  Pt advised to call office if any other questions or concerns. Pt states understanding. Pt has been scheduled for NOB appt.

## 2016-10-08 ENCOUNTER — Ambulatory Visit (INDEPENDENT_AMBULATORY_CARE_PROVIDER_SITE_OTHER): Payer: 59 | Admitting: Obstetrics

## 2016-10-08 ENCOUNTER — Encounter: Payer: Self-pay | Admitting: Obstetrics

## 2016-10-08 VITALS — BP 119/68 | HR 65 | Wt 301.2 lb

## 2016-10-08 DIAGNOSIS — O09899 Supervision of other high risk pregnancies, unspecified trimester: Secondary | ICD-10-CM

## 2016-10-08 DIAGNOSIS — O09521 Supervision of elderly multigravida, first trimester: Secondary | ICD-10-CM | POA: Diagnosis not present

## 2016-10-08 DIAGNOSIS — Z1151 Encounter for screening for human papillomavirus (HPV): Secondary | ICD-10-CM

## 2016-10-08 DIAGNOSIS — Z3A10 10 weeks gestation of pregnancy: Secondary | ICD-10-CM

## 2016-10-08 DIAGNOSIS — O09529 Supervision of elderly multigravida, unspecified trimester: Secondary | ICD-10-CM

## 2016-10-08 DIAGNOSIS — Z124 Encounter for screening for malignant neoplasm of cervix: Secondary | ICD-10-CM

## 2016-10-08 DIAGNOSIS — Z113 Encounter for screening for infections with a predominantly sexual mode of transmission: Secondary | ICD-10-CM

## 2016-10-08 NOTE — Progress Notes (Signed)
Subjective:  Cheryl Acosta is a 41 y.o. [id number] at [redacted]w[redacted]d being seen today for ongoing prenatal care.  She is currently monitored for the following issues for this high-risk pregnancy:  AMA.   Patient reports no bleeding and no cramping.  Contractions: Not present. Vag. Bleeding: None.   . Denies leaking of fluid.   The following portions of the patient's history were reviewed and updated as appropriate: allergies, current medications, past family history, past medical history, past social history, past surgical history and problem list. Problem list updated.  Objective:   Vitals:   10/08/16 1605  BP: 119/68  Pulse: 65  Weight: (!) 301 lb 3.2 oz (136.6 kg)    Fetal Status: Fetal Heart Rate (bpm): 160         General:  Alert, oriented and cooperative. Patient is in no acute distress.  Skin: Skin is warm and dry. No rash noted.   Cardiovascular: Normal heart rate noted  Respiratory: Normal respiratory effort, no problems with respiration noted  Abdomen: Soft, gravid, appropriate for gestational age. Pain/Pressure: Absent     Pelvic:   Cervix long / closed  Extremities: Normal range of motion.  Edema: None  Mental Status: Normal mood and affect. Normal behavior. Normal judgment and thought content.   Urinalysis:      Assessment and Plan:  Pregnancy: G4P2012 at [redacted]w[redacted]d  1. [redacted] weeks gestation of pregnancy Rx: - Cytology - PAP - Cervicovaginal ancillary only - HIV antibody - Hemoglobinopathy evaluation - Varicella zoster antibody, IgG - VITAMIN D 25 Hydroxy (Vit-D Deficiency, Fractures) - Culture, OB Urine - Prenatal Profile I - AMB MFM GENETICS REFERRAL - [place] MFM Fetal Nuchal Translucency; Future  2. AMA (advanced maternal age) multigravida 27+, first trimester Referred to MFM and Genetic Counselor  3. Supervision of other high risk pregnancy, antepartum   Preterm labor symptoms and general obstetric precautions including but not limited to vaginal bleeding, contractions,  leaking of fluid and fetal movement were reviewed in detail with the patient. Please refer to After Visit Summary for other counseling recommendations.  F/U 4 weeks   Shelly Bombard, MDPatient ID: Cheryl Acosta, female   DOB: 03/25/1976, 41 y.o.   MRN: [id number]

## 2016-10-08 NOTE — Progress Notes (Signed)
Patient is concerned about her pregnancy.

## 2016-10-09 LAB — CERVICOVAGINAL ANCILLARY ONLY
BACTERIAL VAGINITIS: POSITIVE — AB
CHLAMYDIA, DNA PROBE: NEGATIVE
Candida vaginitis: NEGATIVE
Neisseria Gonorrhea: NEGATIVE
Trichomonas: NEGATIVE

## 2016-10-10 ENCOUNTER — Other Ambulatory Visit: Payer: Self-pay | Admitting: Obstetrics

## 2016-10-10 DIAGNOSIS — B9689 Other specified bacterial agents as the cause of diseases classified elsewhere: Secondary | ICD-10-CM

## 2016-10-10 DIAGNOSIS — N760 Acute vaginitis: Principal | ICD-10-CM

## 2016-10-10 LAB — PRENATAL PROFILE I(LABCORP)
Antibody Screen: NEGATIVE
BASOS ABS: 0 10*3/uL (ref 0.0–0.2)
Basos: 0 %
EOS (ABSOLUTE): 0.1 10*3/uL (ref 0.0–0.4)
EOS: 1 %
Hematocrit: 44.9 % (ref 34.0–46.6)
Hemoglobin: 14.2 g/dL (ref 11.1–15.9)
Hepatitis B Surface Ag: NEGATIVE
Immature Grans (Abs): 0 10*3/uL (ref 0.0–0.1)
Immature Granulocytes: 0 %
LYMPHS ABS: 2.1 10*3/uL (ref 0.7–3.1)
Lymphs: 30 %
MCH: 22.3 pg — ABNORMAL LOW (ref 26.6–33.0)
MCHC: 31.6 g/dL (ref 31.5–35.7)
MCV: 70 fL — AB (ref 79–97)
Monocytes Absolute: 0.5 10*3/uL (ref 0.1–0.9)
Monocytes: 7 %
NEUTROS ABS: 4.3 10*3/uL (ref 1.4–7.0)
Neutrophils: 62 %
PLATELETS: 263 10*3/uL (ref 150–379)
RBC: 6.38 x10E6/uL — AB (ref 3.77–5.28)
RDW: 14.5 % (ref 12.3–15.4)
RPR Ser Ql: NONREACTIVE
Rh Factor: POSITIVE
Rubella Antibodies, IGG: 9.65 index (ref >0.99)
WBC: 6.9 10*3/uL (ref 3.4–10.8)

## 2016-10-10 LAB — CYTOLOGY - PAP
Diagnosis: NEGATIVE
HPV: NOT DETECTED

## 2016-10-10 LAB — VARICELLA ZOSTER ANTIBODY, IGG: Varicella zoster IgG: 845 index (ref >165)

## 2016-10-10 LAB — HEMOGLOBINOPATHY EVALUATION
HEMOGLOBIN F QUANTITATION: 0 % (ref 0.0–2.0)
HGB A: 68.1 % — AB (ref 96.4–98.8)
HGB C: 0 %
HGB S: 27.3 % — AB
HGB VARIANT: 0 %
Hemoglobin A2 Quantitation: 4.6 % — ABNORMAL HIGH (ref 1.8–3.2)

## 2016-10-10 LAB — VITAMIN D 25 HYDROXY (VIT D DEFICIENCY, FRACTURES): Vit D, 25-Hydroxy: 22 ng/mL — ABNORMAL LOW (ref 30.0–100.0)

## 2016-10-10 LAB — HIV ANTIBODY (ROUTINE TESTING W REFLEX): HIV Screen 4th Generation wRfx: NONREACTIVE

## 2016-10-10 MED ORDER — CLINDAMYCIN HCL 300 MG PO CAPS: 300.0000 mg | ORAL_CAPSULE | Freq: Three times a day (TID) | ORAL | 0 refills | Status: DC

## 2016-10-11 ENCOUNTER — Other Ambulatory Visit: Payer: Self-pay | Admitting: Obstetrics

## 2016-10-11 DIAGNOSIS — O09899 Supervision of other high risk pregnancies, unspecified trimester: Secondary | ICD-10-CM

## 2016-10-11 DIAGNOSIS — Z87440 Personal history of urinary (tract) infections: Secondary | ICD-10-CM

## 2016-10-11 DIAGNOSIS — A491 Streptococcal infection, unspecified site: Secondary | ICD-10-CM

## 2016-10-11 LAB — URINE CULTURE, OB REFLEX

## 2016-10-11 LAB — CULTURE, OB URINE

## 2016-10-11 MED ORDER — AMOXICILLIN-POT CLAVULANATE 875-125 MG PO TABS: 1.0000 | ORAL_TABLET | Freq: Two times a day (BID) | ORAL | 0 refills | Status: DC

## 2016-10-12 ENCOUNTER — Telehealth: Payer: Self-pay

## 2016-10-12 NOTE — Telephone Encounter (Signed)
Returned call, patient had question about medications prescribed.

## 2016-10-19 ENCOUNTER — Encounter (HOSPITAL_COMMUNITY): Payer: Self-pay | Admitting: Obstetrics

## 2016-10-25 ENCOUNTER — Ambulatory Visit (HOSPITAL_COMMUNITY)
Admission: RE | Admit: 2016-10-25 | Discharge: 2016-10-25 | Disposition: A | Discharge status: Home / Self Care | Payer: 59 | Source: Ambulatory Visit | Attending: Obstetrics | Admitting: Obstetrics

## 2016-10-25 ENCOUNTER — Encounter (HOSPITAL_COMMUNITY): Payer: Self-pay

## 2016-10-25 ENCOUNTER — Other Ambulatory Visit: Payer: Self-pay

## 2016-10-25 DIAGNOSIS — O283 Abnormal ultrasonic finding on antenatal screening of mother: Secondary | ICD-10-CM

## 2016-10-25 DIAGNOSIS — Z3A12 12 weeks gestation of pregnancy: Secondary | ICD-10-CM | POA: Diagnosis not present

## 2016-10-25 DIAGNOSIS — Z3682 Encounter for antenatal screening for nuchal translucency: Secondary | ICD-10-CM | POA: Diagnosis not present

## 2016-10-25 DIAGNOSIS — O09521 Supervision of elderly multigravida, first trimester: Secondary | ICD-10-CM | POA: Insufficient documentation

## 2016-10-25 DIAGNOSIS — Z3A10 10 weeks gestation of pregnancy: Secondary | ICD-10-CM

## 2016-10-25 HISTORY — DX: Anemia, unspecified: D64.9

## 2016-10-26 ENCOUNTER — Other Ambulatory Visit (HOSPITAL_COMMUNITY): Payer: Self-pay | Admitting: *Deleted

## 2016-10-26 DIAGNOSIS — O283 Abnormal ultrasonic finding on antenatal screening of mother: Secondary | ICD-10-CM

## 2016-10-29 DIAGNOSIS — O283 Abnormal ultrasonic finding on antenatal screening of mother: Secondary | ICD-10-CM | POA: Insufficient documentation

## 2016-10-29 DIAGNOSIS — Z3A13 13 weeks gestation of pregnancy: Secondary | ICD-10-CM | POA: Insufficient documentation

## 2016-10-29 NOTE — Progress Notes (Signed)
Genetic Counseling  High-Risk Gestation Note  Appointment Date:  10/25/2016 Referred By: Shelly Bombard, MD Date of Birth:  Jan 07, 1976 Partner:  Tillie Fantasia   Pregnancy History: [id number] Estimated Date of Delivery: 05/06/17 Estimated Gestational Age: [redacted]w[redacted]d Attending: Benjaman Lobe, MD   Ms. Cheryl Acosta was seen for genetic counseling because of a maternal age of 41 y.o. and increased nuchal translucency space on today's ultrasound.     In summary:  Discussed AMA and associated risk for fetal aneuploidy  Ultrasound performed today- NT increased (3.5 mm)  Discussed associations with normal variation, chromosome conditions, single gene conditions, structural defects  Discussed options for screening  First screen- declined maternal serum screen  Quad screen-declined  NIPS- elected to pursue today (Panorama)  Ultrasound- detailed scheduled 12/04/16  Fetal Echocardiogram  Discussed diagnostic testing options  [facility]- declined  Amniocentesis- declined  Reviewed family history concerns- Patient has sickle cell trait  Discussed autosomal recessive inheritance of sickle cell disease  Father of pregnancy not yet screened to patient's knowledge; discussed general population carrier frequency of approximately 1 in 6  Patient not interested in screening for father of pregnancy at this time  Reviewed prenatal diagnosis available as well as newborn screening for hemoglobinopathies  Discussed carrier screening options  CF-declined  SMA-declined  She was counseled regarding maternal age and the association with risk for chromosome conditions due to nondisjunction with aging of the ova.   We reviewed chromosomes, nondisjunction, and the associated 1 in 63 risk for fetal aneuploidy related to a maternal age of 41 y.o. at [redacted]w[redacted]d gestation.  She was counseled that the risk for aneuploidy decreases as gestational age increases, accounting for those pregnancies which spontaneously  abort.  We specifically discussed Down syndrome (trisomy 65), trisomies 35 and 51, and sex chromosome aneuploidies (47,XXX and 47,XXY) including the common features and prognoses of each.   The patient had a nuchal translucency (NT) ultrasound today, which revealed an NT measurement of 3.5 mm. We discussed that the fetal NT refers to a fluid filled space between the skin and soft tissues behind the cervical spine. This space is traditionally measurable between 11 and 13.[redacted] weeks gestation and is considered enlarged when the measurement is equal to or greater than the 95th percentile for the gestational age. Ms. Cheryl Acosta was counseled regarding the various common etiologies for an enlarged NT including: aneuploidy, single gene conditions, cardiac or great vessel abnormalities, lymphatic system failure, decreased fetal movement, and fetal anemia.   We reviewed chromosomes, nondisjunction, and the common features and prognoses of Down syndrome and other aneuploidies. Considering CherylAcosta's age, an NT of 3.5 mm and a CRL of 73 mm, the risk for fetal aneuploidy is estimated to be approximately 14%.Cheryl Acosta She was then counseled regarding the availability of aneuploidy screening options. We reviewed available screening options including First Screen, Quad screen, noninvasive prenatal screening (NIPS)/cell free DNA (cfDNA) screening, and detailed ultrasound.  She was counseled that screening tests are used to modify a patient's a priori risk for aneuploidy, typically based on age. This estimate provides a pregnancy specific risk assessment. We reviewed the benefits and limitations of each option. Specifically, we discussed the conditions for which each test screens, the detection rates, and false positive rates of each.   She was also counseled regarding diagnostic testing via [facility] and amniocentesis. We reviewed the approximate 1 in 106-269 risk for complications from amniocentesis, including spontaneous pregnancy loss.  We discussed the possible results that the tests might provide including: positive, negative,  unanticipated, and no result. Finally, they were counseled regarding the cost of each option and potential out of pocket expenses. After consideration of all the options, she elected to proceed with NIPS (Panorama through [facility] laboratory). Those results will be available in approximately 7-10 days. She declined maternal serum screening and declined diagnostic, invasive testing (amniocentesis). Detailed anatomy ultrasound is scheduled for 12/04/16. She indicated that she is not overly concerned about the chance for an underlying condition to be in the pregnancy.   In addition, we discussed that an NT of 3.5 mm is associated with an approximate 3% risk for a fetal cardiac anomaly. We discussed the options of a fetal echocardiogram and detailed anatomy ultrasound as methods to evaluate the fetal heart. A fetal echocardiogram was ordered today.   We also discussed single gene conditions. They were counseled that an increased NT is associated with an increased chance for specific single gene conditions including Noonan spectrum disorders, skeletal dysplasias, SLOS, CdLS, and many others. We discussed that these conditions are not routinely tested for prenatally unless ultrasound findings or family history significantly increase the suspicion of a specific single gene disorder; however, there is new noninvasive prenatal screening (NIPS) technology which assesses for specific alterations in 30 genes, including the genes associated with Noonan syndrome, some skeletal dysplasias, and CdLS. We discussed that this testing, marketed as [employer]res, is available. We reviewed the benefits, limitations, and cost of this technology. Cheryl Acosta declined single gene NIPS at this time. She understands that many of the features of these conditions can be detected by detailed ultrasound during the second trimester; however, ultrasound cannot  definitively diagnose or rule out these conditions. We briefly reviewed common inheritance patterns (dominant, recessive, and X-linked) as well as the associated risks of recurrence.   We then discussed that an increased NT value can be a normal variant, which can resolve during the pregnancy. She was counseled that the fetal prognosis depends on the underlying etiology of the enlarged NT and further anticipatory guidance can be provided if a diagnosis is discovered. She understands that screening tests cannot rule out all birth defects or genetic syndromes. The patient was advised of this limitation.   We also reviewed Ms. Bolger's sickle cell anemia screening results. 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 The father of the pregnancy has no known family history of sickle cell anemia or sickle cell trait, and Ms. Goettl reported that he has not had screening to her knowledge. He has [place] African ancestry, and consanguinity was denied for the couple.   We discussed that sickle cell anemia (SCA) affects the shape and function of the red blood cell by producing abnormal hemoglobin. She was counseled that hemoglobin is a protein in the RBCs that carries oxygen to the body's organs. Individuals who have SCA have a changes within the genes that codes for hemoglobin. This couple was counseled that SCA is inherited in an autosomal recessive manner, and occurs when both copies of the hemoglobin gene are changed and produce an abnormal hemoglobin S. Typically, one abnormal gene for the production of hemoglobin S is inherited from each parent. A carrier of SCA has one altered copy of the gene for hemoglobin and one typical working copy. Carriers of recessive conditions typically do not have symptoms related to the condition because they still have one functioning copy of the gene, and thus some  production  of the typical protein coded for by that gene.  Given the recessive inheritance, we discussed the importance of understanding the carrier status of the father of the pregnancy in order to accurately predict the risk of a hemoglobinopathy in the fetus.  We discussed that other hemoglobin variants (hemoglobin C), when combined with hemoglobin S, can cause a medically significant hemoglobinopathy. The father of the pregnancy would an approximate 1 in 6 chance to have sickle cell trait, given the reported family history and using the general population frequency. Thus, risk for sickle cell anemia in the pregnancy is approximately 1 in 24, prior to carrier screening for the father of the pregnancy. Screening is available to him via hemoglobin electrophoresis to determine whether he has any hemoglobin variant, including SCT. Prenatal diagnosis would be available via amniocentesis, if desired, if both parents were identified to be carriers. Ms. Otoole stated that she is not interested in screening for him at this time and would not pursue prenatal diagnosis for sickle cell anemia.. They understand that an accurate risk assessment cannot be performed without further information. We also reviewed the availability of newborn screening in [place] for hemoglobinopathies.   Ms. Sojourner Behringer was provided with written information regarding cystic fibrosis (CF), spinal muscular atrophy (SMA) and hemoglobinopathies including the carrier frequency, availability of carrier screening and prenatal diagnosis if indicated.  In addition, we discussed that CF and hemoglobinopathies are routinely screened for as part of the [place] newborn screening panel.  After further discussion, she declined screening for CF and SMA.  Both family histories were reviewed and found to be otherwise noncontributory for birth defects, intellectual disability, and known genetic conditions. Without further information regarding the provided  family history, an accurate genetic risk cannot be calculated. Further genetic counseling is warranted if more information is obtained.  Ms. Armelia Penton denied exposure to environmental toxins or chemical agents. She denied the use of alcohol, tobacco or street drugs. She denied significant viral illnesses during the course of her pregnancy. Her medical and surgical histories were noncontributory.   I counseled Ms. Toniya Kimmons regarding the above risks and available options.  The approximate face-to-face time with the genetic counselor was 45 minutes.  Chipper Oman, MS,  Certified Genetic Counselor 10/29/2016

## 2016-11-05 ENCOUNTER — Telehealth (HOSPITAL_COMMUNITY): Payer: Self-pay | Admitting: MS"

## 2016-11-05 ENCOUNTER — Ambulatory Visit (INDEPENDENT_AMBULATORY_CARE_PROVIDER_SITE_OTHER): Payer: 59 | Admitting: Obstetrics and Gynecology

## 2016-11-05 VITALS — BP 143/81 | HR 67 | Wt 305.0 lb

## 2016-11-05 DIAGNOSIS — O09529 Supervision of elderly multigravida, unspecified trimester: Secondary | ICD-10-CM

## 2016-11-05 DIAGNOSIS — O09899 Supervision of other high risk pregnancies, unspecified trimester: Secondary | ICD-10-CM

## 2016-11-05 DIAGNOSIS — O09522 Supervision of elderly multigravida, second trimester: Secondary | ICD-10-CM

## 2016-11-05 NOTE — Telephone Encounter (Signed)
Called Cheryl Acosta to discuss her prenatal cell free DNA test results.  Mrs. [name] had Panorama testing through [facility].  Testing was offered because of maternal age and increased nuchal translucency on ultrasound.   The patient was identified by name and DOB.  We reviewed that these are within normal limits, showing a less than 1 in 10,000 risk for trisomies 21, 18 and 13, and monosomy X (Turner syndrome).  In addition, the risk for triploidy and sex chromosome trisomies (47,XXX and 47,XXY) was also low risk.  We reviewed that this testing identifies > 99% of pregnancies with trisomy 77, trisomy 73, sex chromosome trisomies (47,XXX and 47,XXY), and triploidy. The detection rate for trisomy 18 is 96%.  The detection rate for monosomy X is ~92%.  The false positive rate is <0.1% for all conditions. Testing was also consistent with female fetal sex.  The patient did wish to know fetal sex.  She understands that this testing does not identify all genetic conditions.  All questions were answered to her satisfaction, she was encouraged to call with additional questions or concerns.  Chipper Oman, MS Certified Genetic Counselor 11/05/2016 10:20 AM

## 2016-11-05 NOTE — Progress Notes (Signed)
   PRENATAL VISIT NOTE  Subjective:  Cheryl Acosta is a 41 y.o. [id number] at [redacted]w[redacted]d being seen today for ongoing prenatal care.  She is currently monitored for the following issues for this high-risk pregnancy and has AMA (advanced maternal age) multigravida 81+, unspecified trimester; Increased nuchal translucency space on fetal ultrasound; [redacted] weeks gestation of pregnancy; and Supervision of other high risk pregnancy, antepartum on her problem list.  Patient reports no complaints.  Contractions: Not present. Vag. Bleeding: None.   . Denies leaking of fluid.   The following portions of the patient's history were reviewed and updated as appropriate: allergies, current medications, past family history, past medical history, past social history, past surgical history and problem list. Problem list updated.  Objective:   Vitals:   11/05/16 1546  BP: (!) 143/81  Pulse: 67  Weight: (!) 305 lb (138.3 kg)    Fetal Status: Fetal Heart Rate (bpm): + on sono         General:  Alert, oriented and cooperative. Patient is in no acute distress.  Skin: Skin is warm and dry. No rash noted.   Cardiovascular: Normal heart rate noted  Respiratory: Normal respiratory effort, no problems with respiration noted  Abdomen: Soft, gravid, appropriate for gestational age. Pain/Pressure: Present     Pelvic:  Cervical exam deferred        Extremities: Normal range of motion.     Mental Status: Normal mood and affect. Normal behavior. Normal judgment and thought content.   Assessment and Plan:  Pregnancy: [id number] at [redacted]w[redacted]d  1. AMA (advanced maternal age) multigravida 52+, unspecified trimester Follow up NIPS results  2. Supervision of other high risk pregnancy, antepartum Patient is doing well without complaints Follow up anatomy ultrasound on 4/24 Patient denies a history of HTN. Elevated BP today. Will monitor closely  General obstetric precautions including but not limited to vaginal bleeding, contractions,  leaking of fluid and fetal movement were reviewed in detail with the patient. Please refer to After Visit Summary for other counseling recommendations.  Return in about 4 weeks (around 12/03/2016) for ROB.   Mora Bellman, MD

## 2016-12-04 ENCOUNTER — Ambulatory Visit (HOSPITAL_COMMUNITY)
Admission: RE | Admit: 2016-12-04 | Discharge: 2016-12-04 | Disposition: A | Discharge status: Home / Self Care | Payer: 59 | Source: Ambulatory Visit | Attending: Obstetrics | Admitting: Obstetrics

## 2016-12-04 ENCOUNTER — Other Ambulatory Visit (HOSPITAL_COMMUNITY): Payer: Self-pay | Admitting: Maternal and Fetal Medicine

## 2016-12-04 ENCOUNTER — Encounter (HOSPITAL_COMMUNITY): Payer: Self-pay

## 2016-12-04 ENCOUNTER — Ambulatory Visit (INDEPENDENT_AMBULATORY_CARE_PROVIDER_SITE_OTHER): Payer: 59 | Admitting: Obstetrics and Gynecology

## 2016-12-04 VITALS — BP 110/66 | HR 92 | Wt 309.0 lb

## 2016-12-04 DIAGNOSIS — O09899 Supervision of other high risk pregnancies, unspecified trimester: Secondary | ICD-10-CM

## 2016-12-04 DIAGNOSIS — J45909 Unspecified asthma, uncomplicated: Secondary | ICD-10-CM | POA: Insufficient documentation

## 2016-12-04 DIAGNOSIS — O358XX0 Maternal care for other (suspected) fetal abnormality and damage, not applicable or unspecified: Secondary | ICD-10-CM | POA: Diagnosis not present

## 2016-12-04 DIAGNOSIS — O09522 Supervision of elderly multigravida, second trimester: Secondary | ICD-10-CM | POA: Diagnosis not present

## 2016-12-04 DIAGNOSIS — O283 Abnormal ultrasonic finding on antenatal screening of mother: Secondary | ICD-10-CM

## 2016-12-04 DIAGNOSIS — O99212 Obesity complicating pregnancy, second trimester: Secondary | ICD-10-CM | POA: Insufficient documentation

## 2016-12-04 DIAGNOSIS — Z3A18 18 weeks gestation of pregnancy: Secondary | ICD-10-CM | POA: Insufficient documentation

## 2016-12-04 DIAGNOSIS — O09529 Supervision of elderly multigravida, unspecified trimester: Secondary | ICD-10-CM

## 2016-12-04 DIAGNOSIS — O99512 Diseases of the respiratory system complicating pregnancy, second trimester: Secondary | ICD-10-CM | POA: Diagnosis not present

## 2016-12-04 DIAGNOSIS — Z3689 Encounter for other specified antenatal screening: Secondary | ICD-10-CM | POA: Insufficient documentation

## 2016-12-04 NOTE — Progress Notes (Signed)
   PRENATAL VISIT NOTE  Subjective:  Cheryl Acosta is a 41 y.o. [id number] at [redacted]w[redacted]d being seen today for ongoing prenatal care.  She is currently monitored for the following issues for this high-risk pregnancy and has AMA (advanced maternal age) multigravida 50+, unspecified trimester; Increased nuchal translucency space on fetal ultrasound; and Supervision of other high risk pregnancy, antepartum on her problem list.  Patient reports no complaints.  Contractions: Not present.  .  Movement: Present. Denies leaking of fluid.   The following portions of the patient's history were reviewed and updated as appropriate: allergies, current medications, past family history, past medical history, past social history, past surgical history and problem list. Problem list updated.  Objective:   Vitals:   12/04/16 1438  BP: 110/66  Pulse: 92  Weight: (!) 309 lb (140.2 kg)    Fetal Status: Fetal Heart Rate (bpm): 132   Movement: Present     General:  Alert, oriented and cooperative. Patient is in no acute distress.  Skin: Skin is warm and dry. No rash noted.   Cardiovascular: Normal heart rate noted  Respiratory: Normal respiratory effort, no problems with respiration noted  Abdomen: Soft, gravid, appropriate for gestational age. Pain/Pressure: Absent     Pelvic:  Cervical exam deferred        Extremities: Normal range of motion.  Edema: Trace  Mental Status: Normal mood and affect. Normal behavior. Normal judgment and thought content.   Assessment and Plan:  Pregnancy: [id number] at [redacted]w[redacted]d  1. AMA (advanced maternal age) multigravida 35+, unspecified trimester Low risk NIPS  2. Supervision of other high risk pregnancy, antepartum Patient is doing well without complaints Anatomy ultrasound at 3:30 today Fetal echo scheduled on 5/18 by MFM  General obstetric precautions including but not limited to vaginal bleeding, contractions, leaking of fluid and fetal movement were reviewed in detail with the  patient. Please refer to After Visit Summary for other counseling recommendations.  Return in about 4 weeks (around 01/01/2017) for ROB.   Mora Bellman, MD

## 2016-12-05 ENCOUNTER — Other Ambulatory Visit (HOSPITAL_COMMUNITY): Payer: Self-pay | Admitting: *Deleted

## 2016-12-05 DIAGNOSIS — O35EXX0 Maternal care for other (suspected) fetal abnormality and damage, fetal genitourinary anomalies, not applicable or unspecified: Secondary | ICD-10-CM

## 2016-12-05 DIAGNOSIS — O358XX0 Maternal care for other (suspected) fetal abnormality and damage, not applicable or unspecified: Secondary | ICD-10-CM

## 2016-12-27 ENCOUNTER — Telehealth: Payer: Self-pay | Admitting: *Deleted

## 2016-12-27 NOTE — Telephone Encounter (Signed)
Pt called back to office. Pt was made aware of OTC meds that are safe in pregnancy. Pt advised she may want to take Claritin daily for allergies, Robitussin for cough/congestion.  Pt made aware she may also try Tylenol Allergy and Mucinex. Pt advised to increase PO fluid intake. Pt advised to call office early next week if symptoms are no better or worsen.  Pt states understanding.

## 2016-12-27 NOTE — Telephone Encounter (Signed)
Pt called to office stating she is having cough and congestion from allergies. Pt would like to know what to take.   Attempt to return call. LM on VM to call office.

## 2017-01-02 ENCOUNTER — Telehealth: Payer: Self-pay

## 2017-01-02 NOTE — Telephone Encounter (Signed)
Pt called stating that she has been diagnosed with bacterial bronchitis, and was prescribed albuterol and azithromycin. Pt wants to know if this is safe for her to take during pregnancy. Left detailed message informing pt on vm that these medications are safe to take during pregnancy per drugs in pregnancy and lactation and normal prescribing practice

## 2017-01-04 ENCOUNTER — Encounter: Payer: Self-pay | Admitting: Obstetrics

## 2017-01-04 ENCOUNTER — Ambulatory Visit (INDEPENDENT_AMBULATORY_CARE_PROVIDER_SITE_OTHER): Payer: 59 | Admitting: Obstetrics

## 2017-01-04 VITALS — BP 127/86 | HR 83 | Wt 309.8 lb

## 2017-01-04 DIAGNOSIS — O09522 Supervision of elderly multigravida, second trimester: Secondary | ICD-10-CM

## 2017-01-04 DIAGNOSIS — O09529 Supervision of elderly multigravida, unspecified trimester: Secondary | ICD-10-CM

## 2017-01-04 NOTE — Progress Notes (Signed)
Pt presents for ROB at 22 wks 4 days. Pt is currently taking antibiotics for bronchitis, and would like to discuss taking medications.

## 2017-01-04 NOTE — Progress Notes (Signed)
Subjective:  Cheryl Acosta is a 41 y.o. [id number] at [redacted]w[redacted]d being seen today for ongoing prenatal care.  She is currently monitored for the following issues for this high risk pregnancy and has AMA (advanced maternal age) multigravida 35+, unspecified trimester; Increased nuchal translucency space on fetal ultrasound; and Supervision of other high risk pregnancy, antepartum on her problem list.  Patient reports URI.  Contractions: Not present. Vag. Bleeding: None.  Movement: Present. Denies leaking of fluid.   The following portions of the patient's history were reviewed and updated as appropriate: allergies, current medications, past family history, past medical history, past social history, past surgical history and problem list. Problem list updated.  Objective:   Vitals:   01/04/17 0831  BP: 127/86  Pulse: 83  Weight: (!) 309 lb 12.8 oz (140.5 kg)    Fetal Status: Fetal Heart Rate (bpm): 140   Movement: Present     General:  Alert, oriented and cooperative. Patient is in no acute distress.  Skin: Skin is warm and dry. No rash noted.   Cardiovascular: Normal heart rate noted  Respiratory: Normal respiratory effort, no problems with respiration noted  Abdomen: Soft, gravid, appropriate for gestational age. Pain/Pressure: Absent     Pelvic:  Cervical exam deferred        Extremities: Normal range of motion.  Edema: None  Mental Status: Normal mood and affect. Normal behavior. Normal judgment and thought content.   Urinalysis:      Assessment and Plan:  Pregnancy: [id number] at [redacted]w[redacted]d  There are no diagnoses linked to this encounter. Preterm labor symptoms and general obstetric precautions including but not limited to vaginal bleeding, contractions, leaking of fluid and fetal movement were reviewed in detail with the patient. Please refer to After Visit Summary for other counseling recommendations.  Return in 4 weeks (on 02/01/2017) for ROB.   Shelly Bombard, MDPatient ID: Cheryl Acosta, female   DOB: [birth year], 41 y.o.   MRN: [id number]

## 2017-01-08 ENCOUNTER — Other Ambulatory Visit: Payer: Self-pay | Admitting: Obstetrics

## 2017-01-08 ENCOUNTER — Ambulatory Visit (HOSPITAL_COMMUNITY)
Admission: RE | Admit: 2017-01-08 | Discharge: 2017-01-08 | Disposition: A | Discharge status: Home / Self Care | Payer: 59 | Source: Ambulatory Visit | Attending: Obstetrics | Admitting: Obstetrics

## 2017-01-08 ENCOUNTER — Encounter (HOSPITAL_COMMUNITY): Payer: Self-pay

## 2017-01-08 ENCOUNTER — Other Ambulatory Visit (HOSPITAL_COMMUNITY): Payer: Self-pay | Admitting: *Deleted

## 2017-01-08 DIAGNOSIS — Z3A23 23 weeks gestation of pregnancy: Secondary | ICD-10-CM | POA: Diagnosis not present

## 2017-01-08 DIAGNOSIS — O09522 Supervision of elderly multigravida, second trimester: Secondary | ICD-10-CM

## 2017-01-08 DIAGNOSIS — J45909 Unspecified asthma, uncomplicated: Secondary | ICD-10-CM | POA: Diagnosis not present

## 2017-01-08 DIAGNOSIS — O99512 Diseases of the respiratory system complicating pregnancy, second trimester: Secondary | ICD-10-CM | POA: Diagnosis not present

## 2017-01-08 DIAGNOSIS — O358XX0 Maternal care for other (suspected) fetal abnormality and damage, not applicable or unspecified: Secondary | ICD-10-CM | POA: Insufficient documentation

## 2017-01-08 DIAGNOSIS — Z3169 Encounter for other general counseling and advice on procreation: Secondary | ICD-10-CM

## 2017-01-08 DIAGNOSIS — O09899 Supervision of other high risk pregnancies, unspecified trimester: Secondary | ICD-10-CM

## 2017-01-08 DIAGNOSIS — O35EXX0 Maternal care for other (suspected) fetal abnormality and damage, fetal genitourinary anomalies, not applicable or unspecified: Secondary | ICD-10-CM

## 2017-01-08 DIAGNOSIS — O283 Abnormal ultrasonic finding on antenatal screening of mother: Secondary | ICD-10-CM

## 2017-01-15 ENCOUNTER — Ambulatory Visit (HOSPITAL_COMMUNITY): Payer: 59

## 2017-01-31 ENCOUNTER — Ambulatory Visit (INDEPENDENT_AMBULATORY_CARE_PROVIDER_SITE_OTHER): Payer: 59 | Admitting: Obstetrics

## 2017-01-31 ENCOUNTER — Encounter: Payer: Self-pay | Admitting: Obstetrics

## 2017-01-31 VITALS — BP 127/82 | HR 68 | Wt 315.7 lb

## 2017-01-31 DIAGNOSIS — O09522 Supervision of elderly multigravida, second trimester: Secondary | ICD-10-CM

## 2017-01-31 DIAGNOSIS — O09892 Supervision of other high risk pregnancies, second trimester: Secondary | ICD-10-CM

## 2017-01-31 DIAGNOSIS — O09529 Supervision of elderly multigravida, unspecified trimester: Secondary | ICD-10-CM

## 2017-01-31 DIAGNOSIS — O09899 Supervision of other high risk pregnancies, unspecified trimester: Secondary | ICD-10-CM

## 2017-01-31 NOTE — Progress Notes (Signed)
Subjective:  Cheryl Acosta is a 41 y.o. [id number] at [redacted]w[redacted]d being seen today for ongoing prenatal care.  She is currently monitored for the following issues for this high-risk pregnancy and has AMA (advanced maternal age) multigravida 81+, unspecified trimester; Increased nuchal translucency space on fetal ultrasound; and Supervision of other high risk pregnancy, antepartum on her problem list.  Patient reports no complaints.  Contractions: Not present. Vag. Bleeding: None.  Movement: Present. Denies leaking of fluid.   The following portions of the patient's history were reviewed and updated as appropriate: allergies, current medications, past family history, past medical history, past social history, past surgical history and problem list. Problem list updated.  Objective:   Vitals:   01/31/17 1614  BP: 127/82  Pulse: 68  Weight: (!) 315 lb 11.2 oz (143.2 kg)    Fetal Status: Fetal Heart Rate (bpm): 140   Movement: Present     General:  Alert, oriented and cooperative. Patient is in no acute distress.  Skin: Skin is warm and dry. No rash noted.   Cardiovascular: Normal heart rate noted  Respiratory: Normal respiratory effort, no problems with respiration noted  Abdomen: Soft, gravid, appropriate for gestational age. Pain/Pressure: Present     Pelvic:  Cervical exam deferred        Extremities: Normal range of motion.  Edema: None  Mental Status: Normal mood and affect. Normal behavior. Normal judgment and thought content.   Urinalysis:      Assessment and Plan:  Pregnancy: [id number] at [redacted]w[redacted]d  1. Supervision of other high risk pregnancy, antepartum - Doing well  2. Elderly multigravida with antepartum condition or complication   Preterm labor symptoms and general obstetric precautions including but not limited to vaginal bleeding, contractions, leaking of fluid and fetal movement were reviewed in detail with the patient. Please refer to After Visit Summary for other counseling  recommendations.  Return in about 2 weeks (around 02/14/2017) for ROB.   Shelly Bombard, MD

## 2017-01-31 NOTE — Progress Notes (Signed)
Patient reports no problems today.  

## 2017-02-08 ENCOUNTER — Emergency Department (HOSPITAL_BASED_OUTPATIENT_CLINIC_OR_DEPARTMENT_OTHER)
Admission: EM | Admit: 2017-02-08 | Discharge: 2017-02-08 | Disposition: A | Discharge status: Home / Self Care | Payer: 59 | Attending: Emergency Medicine | Admitting: Emergency Medicine

## 2017-02-08 ENCOUNTER — Emergency Department (HOSPITAL_BASED_OUTPATIENT_CLINIC_OR_DEPARTMENT_OTHER): Payer: 59

## 2017-02-08 ENCOUNTER — Encounter (HOSPITAL_BASED_OUTPATIENT_CLINIC_OR_DEPARTMENT_OTHER): Payer: Self-pay | Admitting: *Deleted

## 2017-02-08 ENCOUNTER — Ambulatory Visit (HOSPITAL_COMMUNITY)
Admission: RE | Admit: 2017-02-08 | Discharge: 2017-02-08 | Disposition: A | Discharge status: Home / Self Care | Payer: 59 | Source: Ambulatory Visit | Attending: Obstetrics | Admitting: Obstetrics

## 2017-02-08 DIAGNOSIS — Z3A28 28 weeks gestation of pregnancy: Secondary | ICD-10-CM | POA: Insufficient documentation

## 2017-02-08 DIAGNOSIS — O99512 Diseases of the respiratory system complicating pregnancy, second trimester: Secondary | ICD-10-CM | POA: Diagnosis present

## 2017-02-08 DIAGNOSIS — J45901 Unspecified asthma with (acute) exacerbation: Secondary | ICD-10-CM | POA: Diagnosis not present

## 2017-02-08 DIAGNOSIS — Z79899 Other long term (current) drug therapy: Secondary | ICD-10-CM | POA: Insufficient documentation

## 2017-02-08 DIAGNOSIS — J209 Acute bronchitis, unspecified: Secondary | ICD-10-CM | POA: Diagnosis not present

## 2017-02-08 DIAGNOSIS — J40 Bronchitis, not specified as acute or chronic: Secondary | ICD-10-CM

## 2017-02-08 MED ORDER — IPRATROPIUM-ALBUTEROL 0.5-2.5 (3) MG/3ML IN SOLN
RESPIRATORY_TRACT | Status: AC
  Administered 2017-02-08: 3 mL via RESPIRATORY_TRACT
  Filled 2017-02-08: qty 3

## 2017-02-08 MED ORDER — IPRATROPIUM-ALBUTEROL 0.5-2.5 (3) MG/3ML IN SOLN
3.0000 mL | Freq: Once | RESPIRATORY_TRACT | Status: AC
  Administered 2017-02-08: 3 mL via RESPIRATORY_TRACT

## 2017-02-08 MED ORDER — PREDNISONE 20 MG PO TABS
40.0000 mg | ORAL_TABLET | Freq: Every day | ORAL | Status: DC
  Administered 2017-02-08: 40 mg via ORAL
  Filled 2017-02-08: qty 2

## 2017-02-08 MED ORDER — ALBUTEROL SULFATE (2.5 MG/3ML) 0.083% IN NEBU
2.5000 mg | INHALATION_SOLUTION | Freq: Once | RESPIRATORY_TRACT | Status: AC
  Administered 2017-02-08: 2.5 mg via RESPIRATORY_TRACT

## 2017-02-08 MED ORDER — PREDNISONE 20 MG PO TABS
60.0000 mg | ORAL_TABLET | Freq: Every day | ORAL | 0 refills | Status: DC
Start: 2017-02-08 — End: 2017-02-11

## 2017-02-08 MED ORDER — ALBUTEROL SULFATE (2.5 MG/3ML) 0.083% IN NEBU
INHALATION_SOLUTION | RESPIRATORY_TRACT | Status: AC
  Administered 2017-02-08: 5 mg
  Filled 2017-02-08: qty 6

## 2017-02-08 MED ORDER — IPRATROPIUM BROMIDE 0.02 % IN SOLN
0.5000 mg | Freq: Once | RESPIRATORY_TRACT | Status: AC
  Administered 2017-02-08: 0.5 mg via RESPIRATORY_TRACT
  Filled 2017-02-08: qty 2.5

## 2017-02-08 MED ORDER — IPRATROPIUM BROMIDE 0.02 % IN SOLN: 0.5000 mg | Freq: Once | RESPIRATORY_TRACT | Status: DC

## 2017-02-08 MED ORDER — ALBUTEROL SULFATE (2.5 MG/3ML) 0.083% IN NEBU
INHALATION_SOLUTION | RESPIRATORY_TRACT | Status: AC
  Administered 2017-02-08: 2.5 mg via RESPIRATORY_TRACT
  Filled 2017-02-08: qty 3

## 2017-02-08 MED ORDER — ALBUTEROL (5 MG/ML) CONTINUOUS INHALATION SOLN: 5.0000 mg/h | INHALATION_SOLUTION | Freq: Once | RESPIRATORY_TRACT | Status: DC

## 2017-02-08 NOTE — ED Triage Notes (Signed)
Difficulty breathing since yesterday. She saw her MD yesterday and was diagnosed with bronchitis and bronchospasms. She was started on Zpak and Prednisone.

## 2017-02-08 NOTE — Discharge Instructions (Signed)
As we discussed, you should have someone stay with you tonight.  Take your albuterol inhaler every 2 hours tonight. You can go back to taking it every 4 hours tomorrow.   Take the prednisone as directed.   Follow-up with your primary care doctor in 24-48 hours for further evaluation.  Return to the Emergency Department for any worsening shortness of breath, cough, chest pain, vomiting, fever or any other concerns.

## 2017-02-08 NOTE — ED Provider Notes (Signed)
Care assumed at shift change from [facility], PA-C, pending albuterol treatment.  Plan is to discharge after end of treatment. Pt ambulated in ED maintaining O2 sat above 95%.  On eval, pt not in distress, well-appearing. Pt w improvement in SOB. Will discharge as planned with PCP follow up in 2 days. Patient prescribed prednisone, and is to continue using albuterol inhaler at an increased frequency.   Patient discussed with and seen by Dr. Ellender Hose, who agrees with care plan.  Discussed results, findings, treatment and follow up. Patient advised of return precautions. Patient verbalized understanding and agreed with plan.     Russo, [place] N, PA-C 02/08/17 Felicita Gage    Duffy Bruce, MD 02/09/17 [phone]

## 2017-02-08 NOTE — ED Notes (Signed)
Chest tightness started last night.

## 2017-02-08 NOTE — ED Provider Notes (Signed)
[facility] Provider Note   CSN: [id number] Arrival date & time: 02/08/17  1136     History   Chief Complaint No chief complaint on file.   HPI Cheryl Acosta is a 41 y.o. female who is currently [redacted] weeks pregnant with PMH/o asthma who presents with 3 days of worsening wheezing, cough, and SOB. Patient reports that she was seen by her PCP yesterday for evaluation of symptoms and diagnosed with bronchitis and asthma exacerbation. Patient was prescribed 20 mg penicillin daily and instructed to take her albuterol every 4 hours. She was also given a Z-Pak for symptoms. Patient reports that she took one dose of prednisone this morning. She has been using her computer all with minimal relief. Patient states that she came into the emergency department today because she felt like cough and wheezing were getting worse. Patient states that when she walks she starts having a coughing fit which causes her to get secondary shortness of breath. Patient also states that this makes her very nervous which makes the symptoms worse. Patient states she is having some generalized chest tightness that is consistent with her past episodes of asthma exacerbation. Patient denies any chest pain, fever, abdominal pain, nausea/vomiting, leg swelling. Patient denies any recent immobilization, surgery, history of DVTs or PEs.  The history is provided by the patient.    Past Medical History:  Diagnosis Date  . Anemia   . Asthma     Patient Active Problem List   Diagnosis Date Noted  . Supervision of other high risk pregnancy, antepartum 11/05/2016  . Increased nuchal translucency space on fetal ultrasound 10/29/2016  . AMA (advanced maternal age) multigravida 46+, unspecified trimester 10/08/2016    Past Surgical History:  Procedure Laterality Date  . BREAST SURGERY  2003   BILATERAL LUMPECTOMY   . DILATION AND EVACUATION N/A 12/12/2015   Procedure: DILATATION AND EVACUATION;  Surgeon: Shelly Bombard, MD;  Location: [facility];  Service: Gynecology;  Laterality: N/A;  . THUMB FUSION      OB History    Gravida Para Term Preterm AB Living   4 2 2   1 2    SAB TAB Ectopic Multiple Live Births     1     2       Home Medications    Prior to Admission medications   Medication Sig Start Date End Date Taking? Authorizing Provider  acetaminophen (TYLENOL) 500 MG tablet Take 1,000 mg by mouth every 6 (six) hours as needed for mild pain or headache. Reported on 12/26/2015    [provider]  albuterol (PROVENTIL HFA;VENTOLIN HFA) 108 (90 Base) MCG/ACT inhaler Inhale 2 puffs into the lungs every 6 (six) hours as needed for wheezing or shortness of breath.    [provider]  azithromycin (ZITHROMAX) 250 MG tablet Take 250 mg by mouth once. 01/02/17   [provider]  guaiFENesin (ROBITUSSIN) 100 MG/5ML liquid Take 200 mg by mouth 3 (three) times daily as needed for cough.    [provider]  loratadine (CLARITIN) 10 MG tablet Take 10 mg by mouth daily.    [provider]  Prenatal Vit-Iron Carbonyl-FA (VOL-TAB RX) 29-1 MG TABS TAKE ONE TABLET BY MOUTH ONCE DAILY 01/08/17   Shelly Bombard, MD    Family History Family History  Problem Relation Age of Onset  . Diabetes Mother     Social History Social History  Substance Use Topics  . Smoking status: Never Smoker  .  Smokeless tobacco: Never Used  . Alcohol use No     Allergies   Patient has no known allergies.   Review of Systems Review of Systems   Physical Exam Updated Vital Signs BP (!) 120/53 (BP Location: Right Arm)   Pulse 94   Temp 98.5 F (36.9 C) (Oral)   Resp 18   Ht 5\' 8"  (1.727 m)   Wt (!) 142.9 kg (315 lb)   SpO2 99%   BMI 47.90 kg/m   Physical Exam  Constitutional: She is oriented to person, place, and time. She appears well-developed and well-nourished.  Appears uncomfortable but no acute distress  HENT:  Head: Normocephalic and atraumatic.    Mouth/Throat: Oropharynx is clear and moist and mucous membranes are normal.  Eyes: Conjunctivae, EOM and lids are normal. Pupils are equal, round, and reactive to light.  Neck: Full passive range of motion without pain.  Cardiovascular: Normal rate, regular rhythm, normal heart sounds and normal pulses.  Exam reveals no gallop and no friction rub.   No murmur heard. Pulmonary/Chest: Effort normal. No accessory muscle usage. No respiratory distress. She has wheezes.  No evidence of respiratory distress. Able to speak in full sentences without difficulty. Diffuse expiratory wheezes throughout.  Abdominal: Soft. Normal appearance. There is no tenderness. There is no rigidity and no guarding.  Gravid abdomen  Musculoskeletal: Normal range of motion.  Tenderness palpation. No evidence of bilateral lower extremity erythema or edema.  Neurological: She is alert and oriented to person, place, and time.  Skin: Skin is warm and dry. Capillary refill takes less than 2 seconds.  Psychiatric: She has a normal mood and affect. Her speech is normal.  Nursing note and vitals reviewed.    ED Treatments / Results  Labs (all labs ordered are listed, but only abnormal results are displayed) Labs Reviewed - No data to display  EKG  EKG Interpretation None       Radiology Dg Chest 2 View  Result Date: 02/08/2017 CLINICAL DATA:  Cough and congestion for 1 week. EXAM: CHEST  2 VIEW COMPARISON:  04/19/2015 FINDINGS: The cardiac silhouette, mediastinal and hilar contours are within normal limits and stable. There is peribronchial thickening and increased interstitial markings suggesting bronchitis or interstitial pneumonitis. No focal airspace consolidation or pleural effusion. The bony thorax is intact. IMPRESSION: Findings suggest bronchitis or interstitial pneumonitis. No focal infiltrate or effusion. Electronically Signed   By: Marijo Sanes M.D.   On: 02/08/2017 13:43    Procedures Procedures  (including critical care time)  Medications Ordered in ED Medications  ipratropium (ATROVENT) nebulizer solution 0.5 mg (0.5 mg Nebulization Not Given 02/08/17 1347)  albuterol (PROVENTIL,VENTOLIN) solution continuous neb (not administered)  predniSONE (DELTASONE) tablet 40 mg (not administered)  ipratropium-albuterol (DUONEB) 0.5-2.5 (3) MG/3ML nebulizer solution 3 mL (3 mLs Nebulization Given 02/08/17 1152)  albuterol (PROVENTIL) (2.5 MG/3ML) 0.083% nebulizer solution 2.5 mg (2.5 mg Nebulization Given 02/08/17 1152)  ipratropium-albuterol (DUONEB) 0.5-2.5 (3) MG/3ML nebulizer solution 3 mL (3 mLs Nebulization Given 02/08/17 1347)  ipratropium (ATROVENT) nebulizer solution 0.5 mg (0.5 mg Nebulization Given 02/08/17 1652)     Initial Impression / Assessment and Plan / ED Course  I have reviewed the triage vital signs and the nursing notes.  Pertinent labs & imaging results that were available during my care of the patient were reviewed by me and considered in my medical decision making (see chart for details).     41 year old female with past medical history of asthma who presents  with 3 days of cough, wheezing, shortness of breath. Associated with some chest tightness. Recently diagnosed with bronchitis. Started on albuterol every 4 hours prednisone 20 mg daily and Z-Pak. Patient is afebrile, non-toxic appearing, sitting comfortably on examination table. Vital signs reviewed. Initial respiratory rate is 26. Patient's O2 sats are <95% on room air. Lungs show diffuse expiratory wheezing throughout. Consider asthma exacerbation versus bronchitis. Patient received 1 times nebulized treatment initially to arrival. Patient repeat to help with improved wheezing. A chest x-ray for evaluation of any acute infectious etiology.  X-ray reviewed. Shows some evidence of bronchitis. Discussed results with patient. Re-evaluation after nebulizer treatment. Patient has some mild improvement in wheezing. Will plan  to repeat nebulizer treatment.  Reevaluation after nebulizer treatment. Patient's wheezing has improved though still present. She has good air  Movement. Patient is intermittently coughing. Patient states that when she gets up and moves she starts having coughing which causes her to start her shortness of breath. Will plan to ambulate patient in department.  Patient was ambulated in department with O2 sats that remained greater than 95% on room air. Reevaluation. Patient still having some mild wheezing. Will plan to give additional neb treatment. Will plan to give 40 mg of prednisone while here in the department. Discussed with patient regarding treatment. Will have patient increase her albuterol usage every 2 hours until tomorrow to help with symptoms. We'll plan to increase patient's prednisone dosage.  Patient signed out to [place] Russo, [place] with nebulizer  treatment pending.   Final Clinical Impressions(s) / ED Diagnoses   Final diagnoses:  Bronchitis  Exacerbation of asthma, unspecified asthma severity, unspecified whether persistent    New Prescriptions New Prescriptions   No medications on file     Volanda Napoleon, PA-C 02/10/17 [id number]    Volanda Napoleon, PA-C 02/10/17 8119    Dorie Rank, MD 02/11/17 1325

## 2017-02-08 NOTE — ED Notes (Signed)
Patient transported to X-ray 

## 2017-02-09 ENCOUNTER — Encounter (HOSPITAL_BASED_OUTPATIENT_CLINIC_OR_DEPARTMENT_OTHER): Payer: Self-pay | Admitting: Emergency Medicine

## 2017-02-09 ENCOUNTER — Observation Stay (HOSPITAL_BASED_OUTPATIENT_CLINIC_OR_DEPARTMENT_OTHER)
Admission: EM | Admit: 2017-02-09 | Discharge: 2017-02-11 | Disposition: A | Discharge status: Home / Self Care | Payer: 59 | Attending: Internal Medicine | Admitting: Internal Medicine

## 2017-02-09 DIAGNOSIS — R739 Hyperglycemia, unspecified: Secondary | ICD-10-CM | POA: Diagnosis not present

## 2017-02-09 DIAGNOSIS — O09529 Supervision of elderly multigravida, unspecified trimester: Secondary | ICD-10-CM

## 2017-02-09 DIAGNOSIS — O9989 Other specified diseases and conditions complicating pregnancy, childbirth and the puerperium: Secondary | ICD-10-CM | POA: Insufficient documentation

## 2017-02-09 DIAGNOSIS — J45901 Unspecified asthma with (acute) exacerbation: Secondary | ICD-10-CM | POA: Diagnosis not present

## 2017-02-09 DIAGNOSIS — Z79899 Other long term (current) drug therapy: Secondary | ICD-10-CM | POA: Insufficient documentation

## 2017-02-09 DIAGNOSIS — Z3A28 28 weeks gestation of pregnancy: Secondary | ICD-10-CM | POA: Diagnosis not present

## 2017-02-09 DIAGNOSIS — O09523 Supervision of elderly multigravida, third trimester: Secondary | ICD-10-CM | POA: Insufficient documentation

## 2017-02-09 DIAGNOSIS — O99513 Diseases of the respiratory system complicating pregnancy, third trimester: Principal | ICD-10-CM | POA: Insufficient documentation

## 2017-02-09 DIAGNOSIS — J4521 Mild intermittent asthma with (acute) exacerbation: Secondary | ICD-10-CM | POA: Diagnosis not present

## 2017-02-09 LAB — CBC WITH DIFFERENTIAL/PLATELET
Basophils Absolute: 0 10*3/uL (ref 0.0–0.1)
Basophils Relative: 0 %
Eosinophils Absolute: 0 10*3/uL (ref 0.0–0.7)
Eosinophils Relative: 0 %
HCT: 37.4 % (ref 36.0–46.0)
HEMOGLOBIN: 12.8 g/dL (ref 12.0–15.0)
Lymphocytes Relative: 7 %
Lymphs Abs: 0.9 10*3/uL (ref 0.7–4.0)
MCH: 22.7 pg — ABNORMAL LOW (ref 26.0–34.0)
MCHC: 34.2 g/dL (ref 30.0–36.0)
MCV: 66.4 fL — ABNORMAL LOW (ref 78.0–100.0)
Monocytes Absolute: 0.5 10*3/uL (ref 0.1–1.0)
Monocytes Relative: 4 %
NEUTROS ABS: 11.4 10*3/uL — AB (ref 1.7–7.7)
NEUTROS PCT: 88 %
Platelets: 227 10*3/uL (ref 150–400)
RBC: 5.63 MIL/uL — AB (ref 3.87–5.11)
RDW: 15.3 % (ref 11.5–15.5)
WBC: 12.9 10*3/uL — AB (ref 4.0–10.5)

## 2017-02-09 LAB — BASIC METABOLIC PANEL
Anion gap: 9 (ref 5–15)
BUN: 10 mg/dL (ref 6–20)
CALCIUM: 9.3 mg/dL (ref 8.9–10.3)
CHLORIDE: 105 mmol/L (ref 101–111)
CO2: 21 mmol/L — AB (ref 22–32)
Creatinine, Ser: 0.53 mg/dL (ref 0.44–1.00)
GFR calc non Af Amer: >60 mL/min (ref >60)
Glucose, Bld: 163 mg/dL — ABNORMAL HIGH (ref 65–99)
Potassium: 4.4 mmol/L (ref 3.5–5.1)
SODIUM: 135 mmol/L (ref 135–145)

## 2017-02-09 LAB — CBC
HCT: 35.8 % — ABNORMAL LOW (ref 36.0–46.0)
HEMOGLOBIN: 12.3 g/dL (ref 12.0–15.0)
MCH: 22.8 pg — AB (ref 26.0–34.0)
MCHC: 34.4 g/dL (ref 30.0–36.0)
MCV: 66.4 fL — ABNORMAL LOW (ref 78.0–100.0)
PLATELETS: 204 10*3/uL (ref 150–400)
RBC: 5.39 MIL/uL — ABNORMAL HIGH (ref 3.87–5.11)
RDW: 15 % (ref 11.5–15.5)
WBC: 14 10*3/uL — ABNORMAL HIGH (ref 4.0–10.5)

## 2017-02-09 LAB — GLUCOSE, CAPILLARY
GLUCOSE-CAPILLARY: 137 mg/dL — AB (ref 65–99)
GLUCOSE-CAPILLARY: 138 mg/dL — AB (ref 65–99)

## 2017-02-09 LAB — MRSA PCR SCREENING: MRSA by PCR: NEGATIVE

## 2017-02-09 LAB — PHOSPHORUS: PHOSPHORUS: 2.3 mg/dL — AB (ref 2.5–4.6)

## 2017-02-09 LAB — MAGNESIUM: Magnesium: 2 mg/dL (ref 1.7–2.4)

## 2017-02-09 MED ORDER — ALBUTEROL (5 MG/ML) CONTINUOUS INHALATION SOLN
15.0000 mg/h | INHALATION_SOLUTION | RESPIRATORY_TRACT | Status: AC
  Administered 2017-02-09: 15 mg/h via RESPIRATORY_TRACT
  Filled 2017-02-09: qty 20

## 2017-02-09 MED ORDER — PRENATAL MULTIVITAMIN CH
1.0000 | ORAL_TABLET | Freq: Every day | ORAL | Status: DC
  Administered 2017-02-09 – 2017-02-11 (×3): 1 via ORAL
  Filled 2017-02-09 (×3): qty 1

## 2017-02-09 MED ORDER — AZITHROMYCIN 250 MG PO TABS
250.0000 mg | ORAL_TABLET | Freq: Every day | ORAL | Status: DC
  Administered 2017-02-09 – 2017-02-11 (×3): 250 mg via ORAL
  Filled 2017-02-09 (×3): qty 1

## 2017-02-09 MED ORDER — METHYLPREDNISOLONE SODIUM SUCC 125 MG IJ SOLR
125.0000 mg | Freq: Once | INTRAMUSCULAR | Status: AC
  Administered 2017-02-09: 125 mg via INTRAVENOUS
  Filled 2017-02-09: qty 2

## 2017-02-09 MED ORDER — SODIUM CHLORIDE 0.9 % IV BOLUS (SEPSIS)
1000.0000 mL | Freq: Once | INTRAVENOUS | Status: AC
  Administered 2017-02-09: 1000 mL via INTRAVENOUS

## 2017-02-09 MED ORDER — PREDNISONE 20 MG PO TABS
60.0000 mg | ORAL_TABLET | Freq: Every day | ORAL | Status: DC
  Administered 2017-02-09 – 2017-02-11 (×3): 60 mg via ORAL
  Filled 2017-02-09 (×3): qty 3

## 2017-02-09 MED ORDER — SODIUM CHLORIDE 0.9 % IV SOLN
INTRAVENOUS | Status: DC
  Administered 2017-02-09: 15:00:00 via INTRAVENOUS

## 2017-02-09 MED ORDER — ENOXAPARIN SODIUM 40 MG/0.4ML ~~LOC~~ SOLN
40.0000 mg | SUBCUTANEOUS | Status: DC
  Administered 2017-02-09 – 2017-02-10 (×2): 40 mg via SUBCUTANEOUS
  Filled 2017-02-09 (×2): qty 0.4

## 2017-02-09 MED ORDER — ALBUTEROL SULFATE (2.5 MG/3ML) 0.083% IN NEBU: 2.5000 mg | INHALATION_SOLUTION | Freq: Four times a day (QID) | RESPIRATORY_TRACT | Status: DC | PRN

## 2017-02-09 MED ORDER — AZITHROMYCIN 250 MG PO TABS: 250.0000 mg | ORAL_TABLET | Freq: Once | ORAL | Status: DC

## 2017-02-09 MED ORDER — IPRATROPIUM BROMIDE 0.02 % IN SOLN
1.0000 mg | Freq: Once | RESPIRATORY_TRACT | Status: AC
  Administered 2017-02-09: 1 mg via RESPIRATORY_TRACT
  Filled 2017-02-09: qty 5

## 2017-02-09 MED ORDER — IPRATROPIUM-ALBUTEROL 0.5-2.5 (3) MG/3ML IN SOLN
3.0000 mL | RESPIRATORY_TRACT | Status: DC | PRN
  Administered 2017-02-09 – 2017-02-10 (×3): 3 mL via RESPIRATORY_TRACT
  Filled 2017-02-09 (×3): qty 3

## 2017-02-09 MED ORDER — INSULIN ASPART 100 UNIT/ML ~~LOC~~ SOLN
0.0000 [IU] | Freq: Three times a day (TID) | SUBCUTANEOUS | Status: DC
  Administered 2017-02-09 – 2017-02-10 (×2): 1 [IU] via SUBCUTANEOUS

## 2017-02-09 MED ORDER — IPRATROPIUM-ALBUTEROL 0.5-2.5 (3) MG/3ML IN SOLN
3.0000 mL | Freq: Once | RESPIRATORY_TRACT | Status: AC
  Administered 2017-02-09: 3 mL via RESPIRATORY_TRACT
  Filled 2017-02-09: qty 3

## 2017-02-09 MED ORDER — INSULIN ASPART 100 UNIT/ML ~~LOC~~ SOLN: 0.0000 [IU] | Freq: Every day | SUBCUTANEOUS | Status: DC

## 2017-02-09 NOTE — ED Notes (Signed)
Attempted to call report to [facility] -- nurse unavailable at this time.

## 2017-02-09 NOTE — ED Notes (Signed)
Pt able to stand and pivot unassisted to [facility]. In NAD.

## 2017-02-09 NOTE — H&P (Signed)
History and Physical    Cheryl Acosta ZHY:[id number] DOB: 16-Dec-1975 DOA: 02/09/2017  Referring MD/NP/PA:  PCP: Kristopher Glee., MD  Outpatient Specialists: Gynecology, Dr Jodi Mourning Patient coming from: Home  Chief Complaint: shortness of breadth  HPI: Cheryl Acosta is a 41 y.o. female, currently [redacted] weeks pregnant, Gravida 4, Para 2, with medical history significant for but not limited to Bronchial Ashma, never intubated, not steroid dependent, presenting with 2 day history of shortness with wheezing, cough productive of yellowish sputum, and low-grade fever and chills.  She was seen by her PCP and treated with Z-Pak and prednisone for presumptive bronchitis without relief for which reason she came to the ED at [facility].    ED Course: At the ED patient was tachycardic and tachypneic with borderline hypoxia.CXR negative for acute infiltrate.  Twelve-lead EKG was negative for acute ST-T wave changes.  She was treated with bronchodilator nebulizations with IV fluids and  Antibiotics as well steroids with improvement overall but she continued to be tachycardic and tachypneic and was admitted for further observation.  Review of Systems: As per HPI otherwise 10 point review of systems negative.    Past Medical History:  Diagnosis Date  . Anemia   . Asthma     Past Surgical History:  Procedure Laterality Date  . BREAST SURGERY  2003   BILATERAL LUMPECTOMY   . DILATION AND EVACUATION N/A 12/12/2015   Procedure: DILATATION AND EVACUATION;  Surgeon: Shelly Bombard, MD;  Location: [facility];  Service: Gynecology;  Laterality: N/A;  . THUMB FUSION       reports that she has never smoked. She has never used smokeless tobacco. She reports that she does not drink alcohol or use drugs.  No Known Allergies  Family History  Problem Relation Age of Onset  . Diabetes Mother      Prior to Admission medications   Medication Sig Start Date End Date Taking? Authorizing Provider  albuterol  (PROVENTIL HFA;VENTOLIN HFA) 108 (90 Base) MCG/ACT inhaler Inhale 2 puffs into the lungs every 6 (six) hours as needed for wheezing or shortness of breath.   Yes [provider]  azithromycin (ZITHROMAX) 250 MG tablet Take 250 mg by mouth once. 01/02/17  Yes [provider]  predniSONE (DELTASONE) 20 MG tablet Take 3 tablets (60 mg total) by mouth daily. 02/08/17  Yes Volanda Napoleon, PA-C  Prenatal Vit-Iron Carbonyl-FA (VOL-TAB RX) 29-1 MG TABS TAKE ONE TABLET BY MOUTH ONCE DAILY 01/08/17  Yes Shelly Bombard, MD    Physical Exam: Vitals:   02/09/17 1051 02/09/17 1056 02/09/17 1056 02/09/17 1157  BP:  (!) 111/46 (!) 111/46 (!) 105/31  Pulse:  (!) 114 (!) 114   Resp:  (!) 26 20 (!) 24  Temp:    98.1 F (36.7 C)  TempSrc:    Tympanic  SpO2: 100% 99% 99% 96%  Weight:    (!) 140.3 kg (309 lb 4.9 oz)  Height:    5\' 8"  (1.727 m)      Constitutional: NAD, calm, comfortable Vitals:   02/09/17 1051 02/09/17 1056 02/09/17 1056 02/09/17 1157  BP:  (!) 111/46 (!) 111/46 (!) 105/31  Pulse:  (!) 114 (!) 114   Resp:  (!) 26 20 (!) 24  Temp:    98.1 F (36.7 C)  TempSrc:    Tympanic  SpO2: 100% 99% 99% 96%  Weight:    (!) 140.3 kg (309 lb 4.9 oz)  Height:    5\' 8"  (  1.727 m)   Eyes: PERRL, lids and conjunctivae normal ENMT: Mucous membranes are moist. Posterior pharynx clear of any exudate or lesions.Normal dentition.  Neck: normal, supple, no masses, no thyromegaly Respiratory: few occasional bilateral wheezing, otherwise clear to auscultation bilaterally, no wheezing, no crackles. Normal respiratory effort. No accessory muscle use.  Cardiovascular:Tachycardic, regular rhythm, no murmurs / rubs / gallops. No extremity edema. 2+ pedal pulses. No carotid bruits.  Abdomen: Soft, no tenderness, Gravid uterus. No hepatosplenomegaly. Bowel sounds positive.  Musculoskeletal: no clubbing / cyanosis. No joint deformity upper and lower extremities. Good ROM, no contractures. Normal  muscle tone.  Skin: no rashes, lesions, ulcers. No induration Neurologic: CN 2-12 grossly intact. Sensation intact, DTR normal. Strength 5/5 in all 4.  Psychiatric: Normal judgment and insight. Alert and oriented x 3. Normal mood.   Labs on Admission: I have personally reviewed following labs and imaging studies  CBC:  Recent Labs Lab 02/09/17 0305  WBC 12.9*  NEUTROABS 11.4*  HGB 12.8  HCT 37.4  MCV 66.4*  PLT 355   Basic Metabolic Panel:  Recent Labs Lab 02/09/17 0305  NA 135  K 4.4  CL 105  CO2 21*  GLUCOSE 163*  BUN 10  CREATININE 0.53  CALCIUM 9.3   GFR: Estimated Creatinine Clearance: 140.8 mL/min (by C-G formula based on SCr of 0.53 mg/dL). Liver Function Tests: No results for input(s): AST, ALT, ALKPHOS, BILITOT, PROT, ALBUMIN in the last 168 hours. No results for input(s): LIPASE, AMYLASE in the last 168 hours. No results for input(s): AMMONIA in the last 168 hours. Coagulation Profile: No results for input(s): INR, PROTIME in the last 168 hours. Cardiac Enzymes: No results for input(s): CKTOTAL, CKMB, CKMBINDEX, TROPONINI in the last 168 hours. BNP (last 3 results) No results for input(s): PROBNP in the last 8760 hours. HbA1C: No results for input(s): HGBA1C in the last 72 hours. CBG: No results for input(s): GLUCAP in the last 168 hours. Lipid Profile: No results for input(s): CHOL, HDL, LDLCALC, TRIG, CHOLHDL, LDLDIRECT in the last 72 hours. Thyroid Function Tests: No results for input(s): TSH, T4TOTAL, FREET4, T3FREE, THYROIDAB in the last 72 hours. Anemia Panel: No results for input(s): VITAMINB12, FOLATE, FERRITIN, TIBC, IRON, RETICCTPCT in the last 72 hours. Urine analysis:    Component Value Date/Time   BILIRUBINUR neg 12/26/2015 1554   PROTEINUR trace 12/26/2015 1554   UROBILINOGEN negative 12/26/2015 1554   NITRITE neg 12/26/2015 1554   LEUKOCYTESUR large (3+) (A) 12/26/2015 1554   Sepsis  Labs: @LABRCNTIP (procalcitonin:4,lacticidven:4) )No results found for this or any previous visit (from the past 240 hour(s)).   Radiological Exams on Admission: Dg Chest 2 View  Result Date: 02/08/2017 CLINICAL DATA:  Cough and congestion for 1 week. EXAM: CHEST  2 VIEW COMPARISON:  04/19/2015 FINDINGS: The cardiac silhouette, mediastinal and hilar contours are within normal limits and stable. There is peribronchial thickening and increased interstitial markings suggesting bronchitis or interstitial pneumonitis. No focal airspace consolidation or pleural effusion. The bony thorax is intact. IMPRESSION: Findings suggest bronchitis or interstitial pneumonitis. No focal infiltrate or effusion. Electronically Signed   By: Marijo Sanes M.D.   On: 02/08/2017 13:43    EKG: Independently reviewed.   Assessment/Plan Active Problems:   Asthma exacerbation  #1 Acute asthma exacerbation secondary to bronchitis: Antibiotics Steroids Pulmonary toilet measures Supportive care Patient is [redacted] weeks pregnant  #2 Hyperglycemia: No previous hry of diabetes mellitus Check A1c Sliding scale insulin as needed  #3 Cyesis: 28 weeks G4P2 High  risk Pregnancy Routine antenatal care for Obstetrician.   DVT prophylaxis: lovenox  Code Status: (Full) Family Communication: Husband at bedside   Disposition Plan:Home Consults called:  Admission status: ( obs / SDU)   OSEI-BONSU,Janifer Gieselman MD [facility] Pager [phone]  If 7PM-7AM, please contact night-coverage [email or web address] Password [email or web address]  02/09/2017, 12:50 PM

## 2017-02-09 NOTE — Progress Notes (Signed)
Tct: Dr. Kennon Rounds, provider reviewed strip. Will monitor for additional 30 minutes,

## 2017-02-09 NOTE — ED Notes (Signed)
Pt back on cardiac monitor and auto VS. Given ice water per RN aproval

## 2017-02-09 NOTE — Progress Notes (Signed)
Pt was awake and alert when I arrived. Her brothers were bedside. She had expressed interest in AD upon admission. I explained the difference between living will and advance directives and gave her the information. She wanted to read and discuss before making a decision. Pt was very pleasant and appreciative of visit and prayer.  Please page if additional assistance is needed. [place], [place]   02/09/17 1900  Clinical Encounter Type  Visited With Patient and family together

## 2017-02-09 NOTE — ED Provider Notes (Signed)
TIME SEEN: 2:44 AM  CHIEF COMPLAINT: Shortness of breath  HPI: Pt is a 41 y.o. F who is a  [id number] who is currently 27 weeks and 5 days pregnant with history of asthma who presents to the emergency department with complaints of shortness of breath, wheezing that started a week ago. Was seen by her primary care physician 2 days ago and started on prednisone 20 mg daily, azithromycin and albuterol. She came to the emergency department yesterday and had a chest x-ray which showed no infiltrate and had her prednisone increased. She received nebulizer treatments in the emergency department. States overnight her symptoms got worse and she had to return. She states she feels like her chest is tight and she is having a dry cough. No fevers or chills. No history of PE or DVT. No lower extremity swelling or pain. No tobacco use. She denies any abdominal pain, vaginal bleeding or discharge, leaking fluid, dysuria or hematuria. She states she is feeling her baby move today without any problems.  PCP - [facility] - Dr. Jodi Mourning  ROS: See HPI Constitutional: no fever  Eyes: no drainage  ENT: no runny nose   Cardiovascular:   chest pain  Resp:  SOB  GI: no vomiting GU: no dysuria Integumentary: no rash  Allergy: no hives  Musculoskeletal: no leg swelling  Neurological: no slurred speech ROS otherwise negative  PAST MEDICAL HISTORY/PAST SURGICAL HISTORY:  Past Medical History:  Diagnosis Date  . Anemia   . Asthma     MEDICATIONS:  Prior to Admission medications   Medication Sig Start Date End Date Taking? Authorizing Provider  acetaminophen (TYLENOL) 500 MG tablet Take 1,000 mg by mouth every 6 (six) hours as needed for mild pain or headache. Reported on 12/26/2015    [provider]  albuterol (PROVENTIL HFA;VENTOLIN HFA) 108 (90 Base) MCG/ACT inhaler Inhale 2 puffs into the lungs every 6 (six) hours as needed for wheezing or shortness of breath.    [provider]   azithromycin (ZITHROMAX) 250 MG tablet Take 250 mg by mouth once. 01/02/17   [provider]  guaiFENesin (ROBITUSSIN) 100 MG/5ML liquid Take 200 mg by mouth 3 (three) times daily as needed for cough.    [provider]  loratadine (CLARITIN) 10 MG tablet Take 10 mg by mouth daily.    [provider]  predniSONE (DELTASONE) 20 MG tablet Take 3 tablets (60 mg total) by mouth daily. 02/08/17   Volanda Napoleon, PA-C  Prenatal Vit-Iron Carbonyl-FA (VOL-TAB RX) 29-1 MG TABS TAKE ONE TABLET BY MOUTH ONCE DAILY 01/08/17   Shelly Bombard, MD    ALLERGIES:  No Known Allergies  SOCIAL HISTORY:  Social History  Substance Use Topics  . Smoking status: Never Smoker  . Smokeless tobacco: Never Used  . Alcohol use No    FAMILY HISTORY: Family History  Problem Relation Age of Onset  . Diabetes Mother     EXAM: BP 117/72 (BP Location: Right Arm)   Pulse 93   Temp 97.4 F (36.3 C) (Oral)   Ht 5\' 8"  (1.727 m)   Wt (!) 142.9 kg (315 lb)   SpO2 95%   BMI 47.90 kg/m  CONSTITUTIONAL: Alert and oriented and responds appropriately to questions. Well-appearing; well-nourished, afebrile, In mild respiratory distress HEAD: Normocephalic EYES: Conjunctivae clear, pupils appear equal, EOMI ENT: normal nose; moist mucous membranes; No pharyngeal erythema or petechiae, no tonsillar hypertrophy or exudate, no uvular deviation, no unilateral swelling, no trismus or  drooling, no muffled voice, normal phonation, no stridor, no dental caries present, no drainable dental abscess noted, no Ludwig's angina, tongue sits flat in the bottom of the mouth, no angioedema, no facial erythema or warmth, no facial swelling; no pain with movement of the neck. NECK: Supple, no meningismus, no nuchal rigidity, no LAD  CARD: RRR; S1 and S2 appreciated; no murmurs, no clicks, no rubs, no gallops RESP: Normal chest excursion without splinting, patient is tachypneic, diminished aeration at her bases  bilaterally, diffuse expiratory wheezes, speaking short sentences and does appear short of breath with talking, no hypoxia on room air, oxygen 98% on room air at rest, no cyanosis or apnea, no stridor ABD/GI: Normal bowel sounds; gravid uterus; soft, non-tender, no rebound, no guarding, no peritoneal signs, no hepatosplenomegaly BACK:  The back appears normal and is non-tender to palpation, there is no CVA tenderness EXT: Normal ROM in all joints; non-tender to palpation; no edema; normal capillary refill; no cyanosis, no calf tenderness or swelling    SKIN: Normal color for age and race; warm; no rash NEURO: Moves all extremities equally PSYCH: The patient's mood and manner are appropriate. Grooming and personal hygiene are appropriate.  MEDICAL DECISION MAKING: Patient here with asthma exacerbation. She was here less than 24 hours ago and had a normal chest x-ray. Symptoms progressively worsening. She states she's never had an asthma exacerbation this bad. She seems very anxious. She is tachycardic, tachypneic and has diminished aeration but no hypoxia. Speaking short sentences. We'll give continuous albuterol, Atrovent treatment and dose of IV Solu-Medrol. She has early had oral prednisone in the past 24 hours. Will obtain labs. Patient may need admission. This does not look like a CHF exacerbation. I do not think this is a pulmonary embolus. Doubt ACS.  Fetal heart tones are 122.  ED PROGRESS: 5:00 AM  Patient's labs unremarkable other than mild leukocytosis but she is on steroids. On reevaluation, her tachypnea has improved but she still has diffuse expiratory wheezing. She is now able to speak full sentences but still looks very short of breath when she is talking. I have not got her up to walk her. She has better aeration. She states she is feeling better but I do not feel she is ready for discharge. She still has tachypnea, wheezing. I have not witnessed any hypoxia here. I feel she will need  admission and likely to a stepdown bed for closer observation where she can receive continuous treatments. She is comfortable with this plan. Her primary care physician is at [facility]. We'll discuss with hospitalist for admission.  Patient has not received any IV magnesium. Clinically she is moving air I do not feel she needs this medication at this time but also I do not for comfortable giving it to her at this time because she is pregnant. She has received IV steroids.  5:39 AM Discussed patient's case with hospitalist, Dr. Eulas Post.  I have recommended admission and patient (and family if present) agree with this plan. Admitting physician will place admission orders.   I reviewed all nursing notes, vitals, pertinent previous records, EKGs, lab and urine results, imaging (as available).     EKG Interpretation  Date/Time:  Saturday February 09 2017 03:11:32 EDT Ventricular Rate:  84 PR Interval:    QRS Duration: 88 QT Interval:  359 QTC Calculation: 425 R Axis:   54 Text Interpretation:  Sinus rhythm Abnormal R-wave progression, early transition Confirmed by Ward, Cyril Mourning [phone]) on 02/09/2017 3:20:08 AM  CRITICAL CARE Performed by: Nyra Jabs   Total critical care time: 65 minutes  Critical care time was exclusive of separately billable procedures and treating other patients.  Critical care was necessary to treat or prevent imminent or life-threatening deterioration.  Critical care was time spent personally by me on the following activities: development of treatment plan with patient and/or surrogate as well as nursing, discussions with consultants, evaluation of patient's response to treatment, examination of patient, obtaining history from patient or surrogate, ordering and performing treatments and interventions, ordering and review of laboratory studies, ordering and review of radiographic studies, pulse oximetry and re-evaluation of patient's condition.    Ward,  Delice Bison, DO 02/09/17 [phone]

## 2017-02-09 NOTE — ED Notes (Signed)
ED Provider at bedside. 

## 2017-02-09 NOTE — Progress Notes (Signed)
41yo woman who is [redacted] weeks pregnant.  Asthma exacerbation, improved after continuous nebulizer treatment and IV solumedrol in the ED.  She is not hypoxic but remains tachypneic and tachycardic.  Air movement improved but still significant wheezing.  ED attending recommending stepdown unit.  Accepted to stepdown unit, observation status at [facility].

## 2017-02-09 NOTE — Progress Notes (Signed)
Tcf: Dr. Kennon Rounds, reviewed strip. EFM removed.

## 2017-02-09 NOTE — ED Notes (Signed)
Fetal heart rate is 122 strong with no decels

## 2017-02-09 NOTE — ED Triage Notes (Signed)
Seen earlier with bronchitis  States no better  Sob with wheezing and cough

## 2017-02-09 NOTE — Progress Notes (Signed)
Monitors applied for daily NST

## 2017-02-10 DIAGNOSIS — J4551 Severe persistent asthma with (acute) exacerbation: Secondary | ICD-10-CM | POA: Diagnosis not present

## 2017-02-10 LAB — CREATININE, SERUM
Creatinine, Ser: 0.61 mg/dL (ref 0.44–1.00)
GFR calc Af Amer: >60 mL/min (ref >60)
GFR calc non Af Amer: >60 mL/min (ref >60)

## 2017-02-10 LAB — COMPREHENSIVE METABOLIC PANEL
ALBUMIN: 2.9 g/dL — AB (ref 3.5–5.0)
ALK PHOS: 45 U/L (ref 38–126)
ALT: 18 U/L (ref 14–54)
AST: 20 U/L (ref 15–41)
Anion gap: 8 (ref 5–15)
BILIRUBIN TOTAL: 0.5 mg/dL (ref 0.3–1.2)
BUN: 8 mg/dL (ref 6–20)
CALCIUM: 8.6 mg/dL — AB (ref 8.9–10.3)
CO2: 22 mmol/L (ref 22–32)
Chloride: 107 mmol/L (ref 101–111)
Creatinine, Ser: 0.57 mg/dL (ref 0.44–1.00)
GFR calc Af Amer: >60 mL/min (ref >60)
GFR calc non Af Amer: >60 mL/min (ref >60)
GLUCOSE: 105 mg/dL — AB (ref 65–99)
Potassium: 3.8 mmol/L (ref 3.5–5.1)
Sodium: 137 mmol/L (ref 135–145)
TOTAL PROTEIN: 6.2 g/dL — AB (ref 6.5–8.1)

## 2017-02-10 LAB — CBC
HCT: 34 % — ABNORMAL LOW (ref 36.0–46.0)
HEMOGLOBIN: 11.3 g/dL — AB (ref 12.0–15.0)
MCH: 22.3 pg — ABNORMAL LOW (ref 26.0–34.0)
MCHC: 33.2 g/dL (ref 30.0–36.0)
MCV: 67.2 fL — ABNORMAL LOW (ref 78.0–100.0)
Platelets: 186 10*3/uL (ref 150–400)
RBC: 5.06 MIL/uL (ref 3.87–5.11)
RDW: 15 % (ref 11.5–15.5)
WBC: 12.7 10*3/uL — AB (ref 4.0–10.5)

## 2017-02-10 LAB — GLUCOSE, CAPILLARY
GLUCOSE-CAPILLARY: 124 mg/dL — AB (ref 65–99)
GLUCOSE-CAPILLARY: 99 mg/dL (ref 65–99)
Glucose-Capillary: 107 mg/dL — ABNORMAL HIGH (ref 65–99)
Glucose-Capillary: 108 mg/dL — ABNORMAL HIGH (ref 65–99)

## 2017-02-10 LAB — HEMOGLOBIN A1C
Hgb A1c MFr Bld: 5.9 % — ABNORMAL HIGH (ref 4.8–5.6)
Mean Plasma Glucose: 123 mg/dL

## 2017-02-10 MED ORDER — ALBUTEROL SULFATE (2.5 MG/3ML) 0.083% IN NEBU: 2.5000 mg | INHALATION_SOLUTION | RESPIRATORY_TRACT | Status: DC | PRN

## 2017-02-10 NOTE — Progress Notes (Signed)
Received from ICU to 1614. Chriss Redel, [employer]

## 2017-02-10 NOTE — Progress Notes (Signed)
Spoke with Dr. Ihor Dow. Pt is a G3P2 at 54 1/[redacted] weeks gestation here with asthma exerbation. FHR baseline 145, 150 BPM. Moderate variability, 10x10 accels, 15x15 accels, no decels. No vaginal bleeding or leaking of fluid. Pt will be transferring to the 6th floor today.

## 2017-02-10 NOTE — Progress Notes (Signed)
PROGRESS NOTE    Cheryl Acosta  SEG:[id number] DOB: 12-27-1975 DOA: 02/09/2017 PCP: Kristopher Glee., MD     Brief Narrative:  Cheryl Acosta is a 42 y.o. female, currently [redacted] weeks pregnant, Gravida 4, Para 2, with medical history significant for but not limited to bronchial asthma, never intubated, not steroid dependent, presenting with 2 day history of shortness with wheezing, cough productive of yellowish sputum, and low-grade fever and chills. She was seen by her PCP and treated with Z-Pak and prednisone for presumptive bronchitis without relief for which reason she came to the ED at [facility]. In the ED patient was tachycardic and tachypneic with borderline hypoxia. CXR negative for acute infiltrate. She was admitted under observation status for further evaluation and treatment of acute asthma exacerbation and bronchitis.  Assessment & Plan:   Principal Problem:   Asthma exacerbation Active Problems:   AMA (advanced maternal age) multigravida 53+, unspecified trimester   Hyperglycemia   Acute asthma exacerbation, bronchitis -Continues to have expiratory wheezing on examination today, high risk of readmission due to asthma exacerbation and failed outpatient treatment. Continue antibiotics, steroids, nebs. Hopefully we'll be able to discharge home tomorrow with improvement  Hyperglycemia -No previous history of diabetes, A1c pending, as is likely secondary to steroids -Sliding-scale insulin as needed  Pregnancy -G4P2, 28 week pregnancy -External fetal monitoring completed by OB/GYN RN at time of admission   DVT prophylaxis: lovenox  Code Status: Full Family Communication: No family at bedside Disposition Plan: Pending improvement, discharge home when stable   Consultants:   None  Procedures:   None  Antimicrobials:  Anti-infectives    Start     Dose/Rate Route Frequency Ordered Stop   02/09/17 1400  azithromycin (ZITHROMAX) tablet 250 mg     250 mg Oral  Daily 02/09/17 1306 02/13/17 0959   02/09/17 1315  azithromycin (ZITHROMAX) tablet 250 mg  Status:  Discontinued     250 mg Oral  Once 02/09/17 1303 02/09/17 1306        Subjective: She states that her breathing has improved since admission. Previously, she had significant exertional dyspnea as well as diffuse wheezing. She states that she felt like she couldn't get any air in at one point. Currently, she is feeling better. No dyspnea at rest. Continues to have cough and wheezing.  Objective: Vitals:   02/10/17 0700 02/10/17 0800 02/10/17 0900 02/10/17 0919  BP:   (!) 108/51   Pulse: 73 78 79 84  Resp: (!) 21 (!) 24 (!) 24 20  Temp:  97.9 F (36.6 C)    TempSrc:  Oral    SpO2: 98% 99% 99% 100%  Weight:      Height:        Intake/Output Summary (Last 24 hours) at 02/10/17 1049 Last data filed at 02/09/17 1800  Gross per 24 hour  Intake           802.67 ml  Output                1 ml  Net           801.67 ml   Filed Weights   02/09/17 0239 02/09/17 1157  Weight: (!) 142.9 kg (315 lb) (!) 140.3 kg (309 lb 4.9 oz)    Examination:  General exam: Appears calm and comfortable  Respiratory system: +Expiratory wheezes bilaterally Respiratory effort normal. Cardiovascular system: S1 & S2 heard, RRR. No JVD, murmurs, rubs, gallops or clicks. No pedal edema. Gastrointestinal system: Abdomen  is nondistended, soft and nontender. +gravid belly, No organomegaly or masses felt. Normal bowel sounds heard.  Central nervous system: Alert and oriented. No focal neurological deficits. Extremities: Symmetric 5 x 5 power. Skin: No rashes, lesions or ulcers Psychiatry: Judgement and insight appear normal. Mood & affect appropriate.   Data Reviewed: I have personally reviewed following labs and imaging studies  CBC:  Recent Labs Lab 02/09/17 0305 02/09/17 2331 02/10/17 0757  WBC 12.9* 14.0* 12.7*  NEUTROABS 11.4*  --   --   HGB 12.8 12.3 11.3*  HCT 37.4 35.8* 34.0*  MCV 66.4* 66.4*  67.2*  PLT 227 204 315   Basic Metabolic Panel:  Recent Labs Lab 02/09/17 0305 02/09/17 1320 02/09/17 2331 02/10/17 0757  NA 135  --   --  137  K 4.4  --   --  3.8  CL 105  --   --  107  CO2 21*  --   --  22  GLUCOSE 163*  --   --  105*  BUN 10  --   --  8  CREATININE 0.53  --  0.61 0.57  CALCIUM 9.3  --   --  8.6*  MG  --  2.0  --   --   PHOS  --  2.3*  --   --    GFR: Estimated Creatinine Clearance: 140.8 mL/min (by C-G formula based on SCr of 0.57 mg/dL). Liver Function Tests:  Recent Labs Lab 02/10/17 0757  AST 20  ALT 18  ALKPHOS 45  BILITOT 0.5  PROT 6.2*  ALBUMIN 2.9*   No results for input(s): LIPASE, AMYLASE in the last 168 hours. No results for input(s): AMMONIA in the last 168 hours. Coagulation Profile: No results for input(s): INR, PROTIME in the last 168 hours. Cardiac Enzymes: No results for input(s): CKTOTAL, CKMB, CKMBINDEX, TROPONINI in the last 168 hours. BNP (last 3 results) No results for input(s): PROBNP in the last 8760 hours. HbA1C: No results for input(s): HGBA1C in the last 72 hours. CBG:  Recent Labs Lab 02/09/17 1631 02/09/17 2139 02/10/17 0809  GLUCAP 137* 138* 107*   Lipid Profile: No results for input(s): CHOL, HDL, LDLCALC, TRIG, CHOLHDL, LDLDIRECT in the last 72 hours. Thyroid Function Tests: No results for input(s): TSH, T4TOTAL, FREET4, T3FREE, THYROIDAB in the last 72 hours. Anemia Panel: No results for input(s): VITAMINB12, FOLATE, FERRITIN, TIBC, IRON, RETICCTPCT in the last 72 hours. Sepsis Labs: No results for input(s): PROCALCITON, LATICACIDVEN in the last 168 hours.  Recent Results (from the past 240 hour(s))  MRSA PCR Screening     Status: None   Collection Time: 02/09/17 11:49 AM  Result Value Ref Range Status   MRSA by PCR NEGATIVE NEGATIVE Final    Comment:        The GeneXpert MRSA Assay (FDA approved for NASAL specimens only), is one component of a comprehensive MRSA colonization surveillance  program. It is not intended to diagnose MRSA infection nor to guide or monitor treatment for MRSA infections.        Radiology Studies: Dg Chest 2 View  Result Date: 02/08/2017 CLINICAL DATA:  Cough and congestion for 1 week. EXAM: CHEST  2 VIEW COMPARISON:  04/19/2015 FINDINGS: The cardiac silhouette, mediastinal and hilar contours are within normal limits and stable. There is peribronchial thickening and increased interstitial markings suggesting bronchitis or interstitial pneumonitis. No focal airspace consolidation or pleural effusion. The bony thorax is intact. IMPRESSION: Findings suggest bronchitis or interstitial pneumonitis. No focal  infiltrate or effusion. Electronically Signed   By: Marijo Sanes M.D.   On: 02/08/2017 13:43      Scheduled Meds: . azithromycin  250 mg Oral Daily  . enoxaparin (LOVENOX) injection  40 mg Subcutaneous Q24H  . insulin aspart  0-5 Units Subcutaneous QHS  . insulin aspart  0-9 Units Subcutaneous TID WC  . predniSONE  60 mg Oral Daily  . prenatal multivitamin  1 tablet Oral Daily   Continuous Infusions: . sodium chloride 100 mL/hr at 02/09/17 1438     LOS: 0 days    Time spent: 40 minutes   Dessa Phi, DO [facility] [email or web address] Password [email or web address] 02/10/2017, 10:49 AM

## 2017-02-11 DIAGNOSIS — J4551 Severe persistent asthma with (acute) exacerbation: Secondary | ICD-10-CM | POA: Diagnosis not present

## 2017-02-11 LAB — GLUCOSE, CAPILLARY: Glucose-Capillary: 82 mg/dL (ref 65–99)

## 2017-02-11 MED ORDER — PREDNISONE 10 MG PO TABS: ORAL_TABLET | ORAL | 0 refills | Status: DC

## 2017-02-11 MED ORDER — AZITHROMYCIN 250 MG PO TABS: 250.0000 mg | ORAL_TABLET | Freq: Every day | ORAL | 0 refills | Status: AC

## 2017-02-11 NOTE — Discharge Instructions (Signed)
Asthma Attack Prevention, Adult Although you may not be able to control the fact that you have asthma, you can take actions to prevent episodes of asthma (asthma attacks). These actions include:  Creating a written plan for managing and treating your asthma attacks (asthma action plan).  Monitoring your asthma.  Avoiding things that can irritate your airways or make your asthma symptoms worse (asthma triggers).  Taking your medicines as directed.  Acting quickly if you have signs or symptoms of an asthma attack. What are some ways to prevent an asthma attack? Create a plan Work with your health care provider to create an asthma action plan. This plan should include:  A list of your asthma triggers and how to avoid them.  A list of symptoms that you experience during an asthma attack.  Information about when to take medicine and how much medicine to take.  Information to help you understand your peak flow measurements.  Contact information for your health care providers.  Daily actions that you can take to control asthma. Monitor your asthma   To monitor your asthma:  Use your peak flow meter every morning and every evening for 2-3 weeks. Record the results in a journal. A drop in your peak flow numbers on one or more days may mean that you are starting to have an asthma attack, even if you are not having symptoms.  When you have asthma symptoms, write them down in a journal. Avoid asthma triggers   Work with your health care provider to find out what your asthma triggers are. This can be done by:  Being tested for allergies.  Keeping a journal that notes when asthma attacks occur and what may have contributed to them.  Asking your health care provider whether other medical conditions make your asthma worse. Common asthma triggers include:  Dust.  Smoke. This includes campfire smoke and secondhand smoke from tobacco products.  Pet dander.  Trees, grasses or  pollens.  Very cold, dry, or humid air.  Mold.  Foods that contain high amounts of sulfites.  Strong smells.  Engine exhaust and air pollution.  Aerosol sprays and fumes from household cleaners.  Household pests and their droppings, including dust mites and cockroaches.  Certain medicines, including NSAIDs. Once you have determined your asthma triggers, take steps to avoid them. Depending on your triggers, you may be able to reduce the chance of an asthma attack by:  Keeping your home clean. Have someone dust and vacuum your home for you 1 or 2 times a week. If possible, have them use a high-efficiency particulate arrestance (HEPA) vacuum.  Washing your sheets weekly in hot water.  Using allergy-proof mattress covers and casings on your bed.  Keeping pets out of your home.  Taking care of mold and water problems in your home.  Avoiding areas where people smoke.  Avoiding using strong perfumes or odor sprays.  Avoid spending a lot of time outdoors when pollen counts are high and on very windy days.  Talking with your health care provider before stopping or starting any new medicines. Medicines Take over-the-counter and prescription medicines only as told by your health care provider. Many asthma attacks can be prevented by carefully following your medicine schedule. Taking your medicines correctly is especially important when you cannot avoid certain asthma triggers. Even if you are doing well, do not stop taking your medicine and do not take less medicine. Act quickly If an asthma attack happens, acting quickly can decrease how severe   can decrease how severe it is and how long it lasts. Take these actions:  Pay attention to your symptoms. If you are coughing, wheezing, or having difficulty breathing, do not wait to see if your symptoms go away on their own. Follow your asthma action plan.  If you have followed your asthma action plan and your symptoms are not improving, call your health care  provider or seek immediate medical care at the nearest hospital.  It is important to write down how often you need to use your fast-acting rescue inhaler. You can track how often you use an inhaler in your journal. If you are using your rescue inhaler more often, it may mean that your asthma is not under control. Adjusting your asthma treatment plan may help you to prevent future asthma attacks and help you to gain better control of your condition. How can I prevent an asthma attack when I exercise?  Exercise is a common asthma trigger. To prevent asthma attacks during exercise:  Follow advice from your health care provider about whether you should use your fast-acting inhaler before exercising. Many people with asthma experience exercise-induced bronchoconstriction (EIB). This condition often worsens during vigorous exercise in cold, humid, or dry environments. Usually, people with EIB can stay very active by using a fast-acting inhaler before exercising.  Avoid exercising outdoors in very cold or humid weather.  Avoid exercising outdoors when pollen counts are high.  Warm up and cool down when exercising.  Stop exercising right away if asthma symptoms start.  Consider taking part in exercises that are less likely to cause asthma symptoms such as:  Indoor swimming.  Biking.  Walking.  Hiking.  Playing football.  This information is not intended to replace advice given to you by your health care provider. Make sure you discuss any questions you have with your health care provider. Document Released: 07/18/2009 Document Revised: 03/30/2016 Document Reviewed: 01/14/2016 Elsevier Interactive Patient Education  2017 [place].   Asthma, Adult Asthma is a recurring condition in which the airways tighten and narrow. Asthma can make it difficult to breathe. It can cause coughing, wheezing, and shortness of breath. Asthma episodes, also called asthma attacks, range from minor to  life-threatening. Asthma cannot be cured, but medicines and lifestyle changes can help control it. What are the causes? Asthma is believed to be caused by inherited (genetic) and environmental factors, but its exact cause is unknown. Asthma may be triggered by allergens, lung infections, or irritants in the air. Asthma triggers are different for each person. Common triggers include:  Animal dander.  Dust mites.  Cockroaches.  Pollen from trees or grass.  Mold.  Smoke.  Air pollutants such as dust, household cleaners, hair sprays, aerosol sprays, paint fumes, strong chemicals, or strong odors.  Cold air, weather changes, and winds (which increase molds and pollens in the air).  Strong emotional expressions such as crying or laughing hard.  Stress.  Certain medicines (such as aspirin) or types of drugs (such as beta-blockers).  Sulfites in foods and drinks. Foods and drinks that may contain sulfites include dried fruit, potato chips, and sparkling grape juice.  Infections or inflammatory conditions such as the flu, a cold, or an inflammation of the nasal membranes (rhinitis).  Gastroesophageal reflux disease (GERD).  Exercise or strenuous activity.  What are the signs or symptoms? Symptoms may occur immediately after asthma is triggered or many hours later. Symptoms include:  Wheezing.  Excessive nighttime or early morning coughing.  Frequent or severe  coughing with a common cold.  Chest tightness.  Shortness of breath.  How is this diagnosed? The diagnosis of asthma is made by a review of your medical history and a physical exam. Tests may also be performed. These may include:  Lung function studies. These tests show how much air you breathe in and out.  Allergy tests.  Imaging tests such as X-rays.  How is this treated? Asthma cannot be cured, but it can usually be controlled. Treatment involves identifying and avoiding your asthma triggers. It also involves  medicines. There are 2 classes of medicine used for asthma treatment:  Controller medicines. These prevent asthma symptoms from occurring. They are usually taken every day.  Reliever or rescue medicines. These quickly relieve asthma symptoms. They are used as needed and provide short-term relief.  Your health care provider will help you create an asthma action plan. An asthma action plan is a written plan for managing and treating your asthma attacks. It includes a list of your asthma triggers and how they may be avoided. It also includes information on when medicines should be taken and when their dosage should be changed. An action plan may also involve the use of a device called a peak flow meter. A peak flow meter measures how well the lungs are working. It helps you monitor your condition. Follow these instructions at home:  Take medicines only as directed by your health care provider. Speak with your health care provider if you have questions about how or when to take the medicines.  Use a peak flow meter as directed by your health care provider. Record and keep track of readings.  Understand and use the action plan to help minimize or stop an asthma attack without needing to seek medical care.  Control your home environment in the following ways to help prevent asthma attacks: ? Do not smoke. Avoid being exposed to secondhand smoke. ? Change your heating and air conditioning filter regularly. ? Limit your use of fireplaces and wood stoves. ? Get rid of pests (such as roaches and mice) and their droppings. ? Throw away plants if you see mold on them. ? Clean your floors and dust regularly. Use unscented cleaning products. ? Try to have someone else vacuum for you regularly. Stay out of rooms while they are being vacuumed and for a short while afterward. If you vacuum, use a dust mask from a hardware store, a double-layered or microfilter vacuum cleaner bag, or a vacuum cleaner with a HEPA  filter. ? Replace carpet with wood, tile, or vinyl flooring. Carpet can trap dander and dust. ? Use allergy-proof pillows, mattress covers, and box spring covers. ? Wash bed sheets and blankets every week in hot water and dry them in a dryer. ? Use blankets that are made of polyester or cotton. ? Clean bathrooms and kitchens with bleach. If possible, have someone repaint the walls in these rooms with mold-resistant paint. Keep out of the rooms that are being cleaned and painted. ? Wash hands frequently. Contact a health care provider if:  You have wheezing, shortness of breath, or a cough even if taking medicine to prevent attacks.  The colored mucus you cough up (sputum) is thicker than usual.  Your sputum changes from clear or white to yellow, green, gray, or bloody.  You have any problems that may be related to the medicines you are taking (such as a rash, itching, swelling, or trouble breathing).  You are using a reliever medicine more  than 2-3 times per week.  Your peak flow is still at 50-79% of your personal best after following your action plan for 1 hour.  You have a fever. Get help right away if:  You seem to be getting worse and are unresponsive to treatment during an asthma attack.  You are short of breath even at rest.  You get short of breath when doing very little physical activity.  You have difficulty eating, drinking, or talking due to asthma symptoms.  You develop chest pain.  You develop a fast heartbeat.  You have a bluish color to your lips or fingernails.  You are light-headed, dizzy, or faint.  Your peak flow is less than 50% of your personal best. This information is not intended to replace advice given to you by your health care provider. Make sure you discuss any questions you have with your health care provider. Document Released: 07/30/2005 Document Revised: 01/11/2016 Document Reviewed: 02/26/2013 Elsevier Interactive Patient Education  2017  [employer].

## 2017-02-11 NOTE — Discharge Summary (Signed)
Physician Discharge Summary  Ahnya Akre RCV:[id number] DOB: [birth year] DOA: 02/09/2017  PCP: Kristopher Glee., MD  Admit date: 02/09/2017 Discharge date: 02/11/2017  Admitted From: Home Disposition:  Home  Recommendations for Outpatient Follow-up:  1. Follow up with PCP in 1 week 2. Follow up closely on blood sugars and leukocytosis while on steroids 3. Follow-up with your OB as scheduled  Discharge Condition: Stable CODE STATUS: Full  Diet recommendation: Regular   Brief/Interim Summary: Cheryl Acosta a 41 y.o.female, currently [redacted] weeks pregnant, Gravida 4, Para 2, with medical history significant for but not limited to bronchial asthma, never intubated, not steroid dependent, presenting with 2 day history of shortness with wheezing, cough productive of yellowish sputum, and low-grade fever and chills. She was seen by her PCP and treated with Z-Pak and prednisone for presumptive bronchitis without relief for which reason she came to the ED at [facility]. In the ED patient was tachycardic and tachypneic with borderline hypoxia. CXR negative for acute infiltrate. She was admitted under observation status for further evaluation and treatment of acute asthma exacerbation and bronchitis. She was treated with antibiotics, steroids and nebulizers. She continued to improve throughout her hospitalization. She was discharged home with steroid taper as well as to finish course of antibiotics and follow up with her primary care physician  Discharge Diagnoses:  Principal Problem:   Asthma exacerbation Active Problems:   AMA (advanced maternal age) multigravida 8+, unspecified trimester   Hyperglycemia  Acute asthma exacerbation, bronchitis -Improved with nebs, antibiotics, steroids. Continue steroid taper at discharge with oral antibiotics. Continue albuterol prn.   Hyperglycemia -No previous history of diabetes, A1c 5.9 -Sliding-scale insulin as needed for hyperglycemia likely due  to steroids   Pregnancy -G3P2, 28 week pregnancy -External fetal monitoring completed by OB/GYN RN at time of admission   Discharge Instructions  Discharge Instructions    Call MD for:    Complete by:  As directed    Wheezing, worsening shortness of breath   Call MD for:  extreme fatigue    Complete by:  As directed    Call MD for:  persistant dizziness or light-headedness    Complete by:  As directed    Call MD for:  temperature >100.4    Complete by:  As directed    Diet general    Complete by:  As directed    Discharge instructions    Complete by:  As directed    You were cared for by a hospitalist during your hospital stay. If you have any questions about your discharge medications or the care you received while you were in the hospital after you are discharged, you can call the unit and asked to speak with the hospitalist on call if the hospitalist that took care of you is not available. Once you are discharged, your primary care physician will handle any further medical issues. Please note that NO REFILLS for any discharge medications will be authorized once you are discharged, as it is imperative that you return to your primary care physician (or establish a relationship with a primary care physician if you do not have one) for your aftercare needs so that they can reassess your need for medications and monitor your lab values.   Increase activity slowly    Complete by:  As directed      Allergies as of 02/11/2017   No Known Allergies     Medication List    TAKE these medications   albuterol 108 (  90 Base) MCG/ACT inhaler Commonly known as:  PROVENTIL HFA;VENTOLIN HFA Inhale 2 puffs into the lungs every 6 (six) hours as needed for wheezing or shortness of breath.   azithromycin 250 MG tablet Commonly known as:  ZITHROMAX Take 1 tablet (250 mg total) by mouth daily. What changed:  when to take this   predniSONE 10 MG tablet Commonly known as:  DELTASONE Take 4 tabs  for 3 days, then 3 tabs for 3 days, then 2 tabs for 3 days, then 1 tab for 3 days, then 1/2 tab for 4 days. What changed:  medication strength  how much to take  how to take this  when to take this  additional instructions   VOL-TAB RX 29-1 MG Tabs TAKE ONE TABLET BY MOUTH ONCE DAILY      Follow-up Information    Kristopher Glee., MD. Schedule an appointment as soon as possible for a visit in 1 week(s).   Specialty:  Internal Medicine Contact information: [address] [place] [address] [phone]          No Known Allergies  Consultations:  None    Procedures/Studies: Dg Chest 2 View  Result Date: 02/08/2017 CLINICAL DATA:  Cough and congestion for 1 week. EXAM: CHEST  2 VIEW COMPARISON:  04/19/2015 FINDINGS: The cardiac silhouette, mediastinal and hilar contours are within normal limits and stable. There is peribronchial thickening and increased interstitial markings suggesting bronchitis or interstitial pneumonitis. No focal airspace consolidation or pleural effusion. The bony thorax is intact. IMPRESSION: Findings suggest bronchitis or interstitial pneumonitis. No focal infiltrate or effusion. Electronically Signed   By: Marijo Sanes M.D.   On: 02/08/2017 13:43      Discharge Exam: Vitals:   02/10/17 1352 02/11/17 0416  BP: 116/68 (!) 119/54  Pulse: 81 68  Resp: 18 16  Temp: 98.3 F (36.8 C) 98.4 F (36.9 C)   Vitals:   02/10/17 0919 02/10/17 1200 02/10/17 1352 02/11/17 0416  BP:   116/68 (!) 119/54  Pulse: 84  81 68  Resp: 20  18 16   Temp:  97.8 F (36.6 C) 98.3 F (36.8 C) 98.4 F (36.9 C)  TempSrc:  Oral Oral Oral  SpO2: 100%  100% 100%  Weight:      Height:         General: Pt is alert, awake, not in acute distress Cardiovascular: RRR, S1/S2 +, no rubs, no gallops Respiratory: CTA bilaterally, no wheezing, no rhonchi Abdominal: Soft, NT, ND, bowel sounds + Extremities: no edema, no cyanosis    The results of  significant diagnostics from this hospitalization (including imaging, microbiology, ancillary and laboratory) are listed below for reference.     Microbiology: Recent Results (from the past 240 hour(s))  MRSA PCR Screening     Status: None   Collection Time: 02/09/17 11:49 AM  Result Value Ref Range Status   MRSA by PCR NEGATIVE NEGATIVE Final    Comment:        The GeneXpert MRSA Assay (FDA approved for NASAL specimens only), is one component of a comprehensive MRSA colonization surveillance program. It is not intended to diagnose MRSA infection nor to guide or monitor treatment for MRSA infections.      Labs: BNP (last 3 results) No results for input(s): BNP in the last 8760 hours. Basic Metabolic Panel:  Recent Labs Lab 02/09/17 0305 02/09/17 1320 02/09/17 2331 02/10/17 0757  NA 135  --   --  137  K 4.4  --   --  3.8  CL 105  --   --  107  CO2 21*  --   --  22  GLUCOSE 163*  --   --  105*  BUN 10  --   --  8  CREATININE 0.53  --  0.61 0.57  CALCIUM 9.3  --   --  8.6*  MG  --  2.0  --   --   PHOS  --  2.3*  --   --    Liver Function Tests:  Recent Labs Lab 02/10/17 0757  AST 20  ALT 18  ALKPHOS 45  BILITOT 0.5  PROT 6.2*  ALBUMIN 2.9*   No results for input(s): LIPASE, AMYLASE in the last 168 hours. No results for input(s): AMMONIA in the last 168 hours. CBC:  Recent Labs Lab 02/09/17 0305 02/09/17 2331 02/10/17 0757  WBC 12.9* 14.0* 12.7*  NEUTROABS 11.4*  --   --   HGB 12.8 12.3 11.3*  HCT 37.4 35.8* 34.0*  MCV 66.4* 66.4* 67.2*  PLT 227 204 186   Cardiac Enzymes: No results for input(s): CKTOTAL, CKMB, CKMBINDEX, TROPONINI in the last 168 hours. BNP: Invalid input(s): POCBNP CBG:  Recent Labs Lab 02/10/17 0809 02/10/17 1321 02/10/17 1656 02/10/17 2253 02/11/17 0733  GLUCAP 107* 108* 124* 99 82   D-Dimer No results for input(s): DDIMER in the last 72 hours. Hgb A1c  Recent Labs  02/09/17 1320  HGBA1C 5.9*   Lipid  Profile No results for input(s): CHOL, HDL, LDLCALC, TRIG, CHOLHDL, LDLDIRECT in the last 72 hours. Thyroid function studies No results for input(s): TSH, T4TOTAL, T3FREE, THYROIDAB in the last 72 hours.  Invalid input(s): FREET3 Anemia work up No results for input(s): VITAMINB12, FOLATE, FERRITIN, TIBC, IRON, RETICCTPCT in the last 72 hours. Urinalysis    Component Value Date/Time   BILIRUBINUR neg 12/26/2015 1554   PROTEINUR trace 12/26/2015 1554   UROBILINOGEN negative 12/26/2015 1554   NITRITE neg 12/26/2015 1554   LEUKOCYTESUR large (3+) (A) 12/26/2015 1554   Sepsis Labs Invalid input(s): PROCALCITONIN,  WBC,  LACTICIDVEN Microbiology Recent Results (from the past 240 hour(s))  MRSA PCR Screening     Status: None   Collection Time: 02/09/17 11:49 AM  Result Value Ref Range Status   MRSA by PCR NEGATIVE NEGATIVE Final    Comment:        The GeneXpert MRSA Assay (FDA approved for NASAL specimens only), is one component of a comprehensive MRSA colonization surveillance program. It is not intended to diagnose MRSA infection nor to guide or monitor treatment for MRSA infections.      Time coordinating discharge: 40 minutes  SIGNED:  Dessa Phi, DO [facility] Pager [phone]  If 7PM-7AM, please contact night-coverage [email or web address] Password [email or web address] 02/11/2017, 12:12 PM

## 2017-02-18 ENCOUNTER — Other Ambulatory Visit: Payer: 59

## 2017-02-18 ENCOUNTER — Ambulatory Visit (INDEPENDENT_AMBULATORY_CARE_PROVIDER_SITE_OTHER): Payer: 59 | Admitting: Obstetrics

## 2017-02-18 ENCOUNTER — Encounter: Payer: Self-pay | Admitting: Obstetrics

## 2017-02-18 VITALS — BP 125/80 | HR 91 | Wt 309.1 lb

## 2017-02-18 DIAGNOSIS — K219 Gastro-esophageal reflux disease without esophagitis: Secondary | ICD-10-CM

## 2017-02-18 DIAGNOSIS — O09899 Supervision of other high risk pregnancies, unspecified trimester: Secondary | ICD-10-CM

## 2017-02-18 DIAGNOSIS — O99519 Diseases of the respiratory system complicating pregnancy, unspecified trimester: Secondary | ICD-10-CM

## 2017-02-18 DIAGNOSIS — O99210 Obesity complicating pregnancy, unspecified trimester: Secondary | ICD-10-CM

## 2017-02-18 DIAGNOSIS — O99213 Obesity complicating pregnancy, third trimester: Secondary | ICD-10-CM

## 2017-02-18 DIAGNOSIS — O99513 Diseases of the respiratory system complicating pregnancy, third trimester: Secondary | ICD-10-CM

## 2017-02-18 DIAGNOSIS — O09523 Supervision of elderly multigravida, third trimester: Secondary | ICD-10-CM

## 2017-02-18 DIAGNOSIS — O09529 Supervision of elderly multigravida, unspecified trimester: Secondary | ICD-10-CM

## 2017-02-18 DIAGNOSIS — J45909 Unspecified asthma, uncomplicated: Secondary | ICD-10-CM

## 2017-02-18 DIAGNOSIS — O09893 Supervision of other high risk pregnancies, third trimester: Secondary | ICD-10-CM

## 2017-02-18 MED ORDER — OMEPRAZOLE 20 MG PO CPDR: 20.0000 mg | DELAYED_RELEASE_CAPSULE | Freq: Two times a day (BID) | ORAL | 5 refills | Status: DC

## 2017-02-18 NOTE — Progress Notes (Addendum)
Subjective:  Cheryl Acosta is a 41 y.o. [id number] at [redacted]w[redacted]d being seen today for ongoing prenatal care.  She is currently monitored for the following issues for this high-risk pregnancy and has AMA (advanced maternal age) multigravida 81+, unspecified trimester; Increased nuchal translucency space on fetal ultrasound; Supervision of other high risk pregnancy, antepartum; Asthma exacerbation; and Hyperglycemia on her problem list.  Patient reports heartburn and asthma exacerbation over the weekend which required hospital admission, much improved.  Contractions: Not present. Vag. Bleeding: None.  Movement: Present. Denies leaking of fluid.   The following portions of the patient's history were reviewed and updated as appropriate: allergies, current medications, past family history, past medical history, past social history, past surgical history and problem list. Problem list updated.  Objective:   Vitals:   02/18/17 0930  BP: 125/80  Pulse: 91  Weight: (!) 309 lb 1.6 oz (140.2 kg)    Fetal Status: Fetal Heart Rate (bpm): 140   Movement: Present     General:  Alert, oriented and cooperative. Patient is in no acute distress.  Skin: Skin is warm and dry. No rash noted.   Cardiovascular: Normal heart rate noted  Respiratory: Normal respiratory effort, no problems with respiration noted  Abdomen: Soft, gravid, appropriate for gestational age. Pain/Pressure: Present     Pelvic:  Cervical exam deferred        Extremities: Normal range of motion.  Edema: None  Mental Status: Normal mood and affect. Normal behavior. Normal judgment and thought content.   Urinalysis:      Assessment and Plan:  Pregnancy: [id number] at [redacted]w[redacted]d  1. Supervision of other high risk pregnancy, antepartum Rx: - Glucose Tolerance, 2 Hours w/1 Hour - RPR - HIV antibody - CBC  2. GERD without esophagitis Rx: - omeprazole (PRILOSEC) 20 MG capsule; Take 1 capsule (20 mg total) by mouth 2 (two) times daily before a meal.   Dispense: 60 capsule; Refill: 5  3. Elderly multigravida with antepartum condition or complication  4. Asthma exacerbation. - stable after hospital admission  5. Obesity affecting pregnancy, antepartum  Preterm labor symptoms and general obstetric precautions including but not limited to vaginal bleeding, contractions, leaking of fluid and fetal movement were reviewed in detail with the patient. Please refer to After Visit Summary for other counseling recommendations.  Return in about 2 weeks (around 03/04/2017).   Cheryl Acosta, MDPatient ID: Cheryl Acosta, female   DOB: 1976/01/12, 41 y.o.   MRN: [id number]

## 2017-02-18 NOTE — Progress Notes (Signed)
Patient reports good fetal movement, and occasional pressure.

## 2017-02-18 NOTE — Patient Instructions (Addendum)
Gastroesophageal Reflux Disease, Adult Normally, food travels down the esophagus and stays in the stomach to be digested. However, when a person has gastroesophageal reflux disease (GERD), food and stomach acid move back up into the esophagus. When this happens, the esophagus becomes sore and inflamed. Over time, GERD can create small holes (ulcers) in the lining of the esophagus. What are the causes? This condition is caused by a problem with the muscle between the esophagus and the stomach (lower esophageal sphincter, or LES). Normally, the LES muscle closes after food passes through the esophagus to the stomach. When the LES is weakened or abnormal, it does not close properly, and that allows food and stomach acid to go back up into the esophagus. The LES can be weakened by certain dietary substances, medicines, and medical conditions, including:  Tobacco use.  Pregnancy.  Having a hiatal hernia.  Heavy alcohol use.  Certain foods and beverages, such as coffee, chocolate, onions, and peppermint.  What increases the risk? This condition is more likely to develop in:  People who have an increased body weight.  People who have connective tissue disorders.  People who use NSAID medicines.  What are the signs or symptoms? Symptoms of this condition include:  Heartburn.  Difficult or painful swallowing.  The feeling of having a lump in the throat.  Abitter taste in the mouth.  Bad breath.  Having a large amount of saliva.  Having an upset or bloated stomach.  Belching.  Chest pain.  Shortness of breath or wheezing.  Ongoing (chronic) cough or a night-time cough.  Wearing away of tooth enamel.  Weight loss.  Different conditions can cause chest pain. Make sure to see your health care provider if you experience chest pain. How is this diagnosed? Your health care provider will take a medical history and perform a physical exam. To determine if you have mild or severe  GERD, your health care provider may also monitor how you respond to treatment. You may also have other tests, including:  An endoscopy toexamine your stomach and esophagus with a small camera.  A test thatmeasures the acidity level in your esophagus.  A test thatmeasures how much pressure is on your esophagus.  A barium swallow or modified barium swallow to show the shape, size, and functioning of your esophagus.  How is this treated? The goal of treatment is to help relieve your symptoms and to prevent complications. Treatment for this condition may vary depending on how severe your symptoms are. Your health care provider may recommend:  Changes to your diet.  Medicine.  Surgery.  Follow these instructions at home: Diet  Follow a diet as recommended by your health care provider. This may involve avoiding foods and drinks such as: ? Coffee and tea (with or without caffeine). ? Drinks that containalcohol. ? Energy drinks and sports drinks. ? Carbonated drinks or sodas. ? Chocolate and cocoa. ? Peppermint and mint flavorings. ? Garlic and onions. ? Horseradish. ? Spicy and acidic foods, including peppers, chili powder, curry powder, vinegar, hot sauces, and barbecue sauce. ? Citrus fruit juices and citrus fruits, such as oranges, lemons, and limes. ? Tomato-based foods, such as red sauce, chili, salsa, and pizza with red sauce. ? Fried and fatty foods, such as donuts, french fries, potato chips, and high-fat dressings. ? High-fat meats, such as hot dogs and fatty cuts of red and white meats, such as rib eye steak, sausage, ham, and bacon. ? High-fat dairy items, such as whole milk,   butter, and cream cheese.  Eat small, frequent meals instead of large meals.  Avoid drinking large amounts of liquid with your meals.  Avoid eating meals during the 2-3 hours before bedtime.  Avoid lying down right after you eat.  Do not exercise right after you eat. General  instructions  Pay attention to any changes in your symptoms.  Take over-the-counter and prescription medicines only as told by your health care provider. Do not take aspirin, ibuprofen, or other NSAIDs unless your health care provider told you to do so.  Do not use any tobacco products, including cigarettes, chewing tobacco, and e-cigarettes. If you need help quitting, ask your health care provider.  Wear loose-fitting clothing. Do not wear anything tight around your waist that causes pressure on your abdomen.  Raise (elevate) the head of your bed 6 inches (15cm).  Try to reduce your stress, such as with yoga or meditation. If you need help reducing stress, ask your health care provider.  If you are overweight, reduce your weight to an amount that is healthy for you. Ask your health care provider for guidance about a safe weight loss goal.  Keep all follow-up visits as told by your health care provider. This is important. Contact a health care provider if:  You have new symptoms.  You have unexplained weight loss.  You have difficulty swallowing, or it hurts to swallow.  You have wheezing or a persistent cough.  Your symptoms do not improve with treatment.  You have a hoarse voice. Get help right away if:  You have pain in your arms, neck, jaw, teeth, or back.  You feel sweaty, dizzy, or light-headed.  You have chest pain or shortness of breath.  You vomit and your vomit looks like blood or coffee grounds.  You faint.  Your stool is bloody or black.  You cannot swallow, drink, or eat. This information is not intended to replace advice given to you by your health care provider. Make sure you discuss any questions you have with your health care provider. Document Released: 05/09/2005 Document Revised: 12/28/2015 Document Reviewed: 11/24/2014 Elsevier Interactive Patient Education  2017 [place].  Heartburn During Pregnancy Heartburn is a type of pain or discomfort  that can happen in the throat or chest. It is often described as a burning sensation. Heartburn is common during pregnancy because:  A hormone (progesterone) that is released during pregnancy may relax the valve (lower esophageal sphincter, or LES) that separates the esophagus from the stomach. This allows stomach acid to move up into the esophagus, causing heartburn.  The uterus gets larger and pushes up on the stomach, which pushes more acid into the esophagus. This is especially true in the later stages of pregnancy.  Heartburn usually goes away or gets better after giving birth. What are the causes? Heartburn is caused by stomach acid backing up into the esophagus (reflux). Reflux can be triggered by:  Changing hormone levels.  Large meals.  Certain foods and beverages, such as coffee, chocolate, onions, and peppermint.  Exercise.  Increased stomach acid production.  What increases the risk? You are more likely to experience heartburn during pregnancy if you:  Had heartburn prior to becoming pregnant.  Have been pregnant more than once before.  Are overweight or obese.  The likelihood that you will get heartburn also increases as you get farther along in your pregnancy, especially during the last trimester. What are the signs or symptoms? Symptoms of this condition include:  Burning pain  in the chest or lower throat.  Bitter taste in the mouth.  Coughing.  Problems swallowing.  Vomiting.  Hoarse voice.  Asthma.  Symptoms may get worse when you lie down or bend over. Symptoms are often worse at night. How is this diagnosed? This condition is diagnosed based on:  Your medical history.  Your symptoms.  Blood tests to check for a certain type of bacteria associated with heartburn.  Whether taking heartburn medicine relieves your symptoms.  Examination of the stomach and esophagus using a tube with a light and camera on the end (endoscopy).  How is this  treated? Treatment varies depending on how severe your symptoms are. Your health care provider may recommend:  Over-the-counter medicines (antacids or acid reducers) for mild heartburn.  Prescription medicines to decrease stomach acid or to protect your stomach lining.  Certain changes in your diet.  Raising the head of your bed so it is higher than the foot of the bed. This helps prevent stomach acid from backing up into the esophagus when you are lying down.  Follow these instructions at home: Eating and drinking  Do not drink alcohol during your pregnancy.  Identify foods and beverages that make your symptoms worse, and avoid them.  Beverages that you may want to avoid include: ? Coffee and tea (with or without caffeine). ? Energy drinks and sports drinks. ? Carbonated drinks or sodas. ? Citrus fruit juices.  Foods that you may want to avoid include: ? Chocolate and cocoa. ? Peppermint and mint flavorings. ? Garlic, onions, and horseradish. ? Spicy and acidic foods, including peppers, chili powder, curry powder, vinegar, hot sauces, and barbecue sauce. ? Citrus fruits, such as oranges, lemons, and limes. ? Tomato-based foods, such as red sauce, chili, and salsa. ? Fried and fatty foods, such as donuts, french fries, potato chips, and high-fat dressings. ? High-fat meats, such as hot dogs, cold cuts, sausage, ham, and bacon. ? High-fat dairy items, such as whole milk, butter, and cheese.  Eat small, frequent meals instead of large meals.  Avoid drinking large amounts of liquid with your meals.  Avoid eating meals during the 2-3 hours before bedtime.  Avoid lying down right after you eat.  Do not exercise right after you eat. Medicines  Take over-the-counter and prescription medicines only as told by your health care provider.  Do not take aspirin, ibuprofen, or other NSAIDs unless your health care provider tells you to do that.  You may be instructed to avoid  medicines that contain sodium bicarbonate. General instructions  If directed, raise the head of your bed about 6 inches (15 cm) by putting blocks under the legs. Sleeping with more pillows does not effectively relieve heartburn because it only changes the position of your head.  Do not use any products that contain nicotine or tobacco, such as cigarettes and e-cigarettes. If you need help quitting, ask your health care provider.  Wear loose-fitting clothing.  Try to reduce your stress, such as with yoga or meditation. If you need help managing stress, ask your health care provider.  Maintain a healthy weight. If you are overweight, work with your health care provider to safely lose weight.  Keep all follow-up visits as told by your health care provider. This is important. Contact a health care provider if:  You develop new symptoms.  Your symptoms do not improve with treatment.  You have unexplained weight loss.  You have difficulty swallowing.  You make loud sounds when you breathe (  wheeze).  You have a cough that does not go away.  You have frequent heartburn for more than 2 weeks.  You have nausea or vomiting that does not get better with treatment.  You have pain in your abdomen. Get help right away if:  You have severe chest pain that spreads to your arm, neck, or jaw.  You feel sweaty, dizzy, or light-headed.  You have shortness of breath.  You have pain when swallowing.  You vomit, and your vomit looks like blood or coffee grounds.  Your stool is bloody or black. This information is not intended to replace advice given to you by your health care provider. Make sure you discuss any questions you have with your health care provider. Document Released: 07/27/2000 Document Revised: 04/16/2016 Document Reviewed: 04/16/2016 Elsevier Interactive Patient Education  2018 [place] for Gastroesophageal Reflux Disease, Adult When you have  gastroesophageal reflux disease (GERD), the foods you eat and your eating habits are very important. Choosing the right foods can help ease the discomfort of GERD. Consider working with a diet and nutrition specialist (dietitian) to help you make healthy food choices. What general guidelines should I follow? Eating plan  Choose healthy foods low in fat, such as fruits, vegetables, whole grains, low-fat dairy products, and lean meat, fish, and poultry.  Eat frequent, small meals instead of three large meals each day. Eat your meals slowly, in a relaxed setting. Avoid bending over or lying down until 2-3 hours after eating.  Limit high-fat foods such as fatty meats or fried foods.  Limit your intake of oils, butter, and shortening to less than 8 teaspoons each day.  Avoid the following: ? Foods that cause symptoms. These may be different for different people. Keep a food diary to keep track of foods that cause symptoms. ? Alcohol. ? Drinking large amounts of liquid with meals. ? Eating meals during the 2-3 hours before bed.  Cook foods using methods other than frying. This may include baking, grilling, or broiling. Lifestyle   Maintain a healthy weight. Ask your health care provider what weight is healthy for you. If you need to lose weight, work with your health care provider to do so safely.  Exercise for at least 30 minutes on 5 or more days each week, or as told by your health care provider.  Avoid wearing clothes that fit tightly around your waist and chest.  Do not use any products that contain nicotine or tobacco, such as cigarettes and e-cigarettes. If you need help quitting, ask your health care provider.  Sleep with the head of your bed raised. Use a wedge under the mattress or blocks under the bed frame to raise the head of the bed. What foods are not recommended? The items listed may not be a complete list. Talk with your dietitian about what dietary choices are best for  you. Grains Pastries or quick breads with added fat. [place] toast. Vegetables Deep fried vegetables. [place] fries. Any vegetables prepared with added fat. Any vegetables that cause symptoms. For some people this may include tomatoes and tomato products, chili peppers, onions and garlic, and horseradish. Fruits Any fruits prepared with added fat. Any fruits that cause symptoms. For some people this may include citrus fruits, such as oranges, grapefruit, pineapple, and lemons. Meats and other protein foods High-fat meats, such as fatty beef or pork, hot dogs, ribs, ham, sausage, salami and bacon. Fried meat or protein, including fried fish and fried chicken. Nuts  and nut butters. Dairy Whole milk and chocolate milk. Sour cream. Cream. Ice cream. Cream cheese. Milk shakes. Beverages Coffee and tea, with or without caffeine. Carbonated beverages. Sodas. Energy drinks. Fruit juice made with acidic fruits (such as orange or grapefruit). Tomato juice. Alcoholic drinks. Fats and oils Butter. Margarine. Shortening. Ghee. Sweets and desserts Chocolate and cocoa. Donuts. Seasoning and other foods Pepper. Peppermint and spearmint. Any condiments, herbs, or seasonings that cause symptoms. For some people, this may include curry, hot sauce, or vinegar-based salad dressings. Summary  When you have gastroesophageal reflux disease (GERD), food and lifestyle choices are very important to help ease the discomfort of GERD.  Eat frequent, small meals instead of three large meals each day. Eat your meals slowly, in a relaxed setting. Avoid bending over or lying down until 2-3 hours after eating.  Limit high-fat foods such as fatty meat or fried foods. This information is not intended to replace advice given to you by your health care provider. Make sure you discuss any questions you have with your health care provider. Document Released: 07/30/2005 Document Revised: 07/31/2016 Document Reviewed:  07/31/2016 Elsevier Interactive Patient Education  2017 [employer].

## 2017-02-19 ENCOUNTER — Encounter (HOSPITAL_COMMUNITY): Payer: Self-pay

## 2017-02-19 ENCOUNTER — Ambulatory Visit (HOSPITAL_COMMUNITY)
Admission: RE | Admit: 2017-02-19 | Discharge: 2017-02-19 | Disposition: A | Discharge status: Home / Self Care | Payer: 59 | Source: Ambulatory Visit | Attending: Obstetrics | Admitting: Obstetrics

## 2017-02-19 ENCOUNTER — Other Ambulatory Visit (HOSPITAL_COMMUNITY): Payer: Self-pay | Admitting: Maternal and Fetal Medicine

## 2017-02-19 DIAGNOSIS — O09522 Supervision of elderly multigravida, second trimester: Secondary | ICD-10-CM

## 2017-02-19 DIAGNOSIS — O99213 Obesity complicating pregnancy, third trimester: Secondary | ICD-10-CM | POA: Diagnosis not present

## 2017-02-19 DIAGNOSIS — O99212 Obesity complicating pregnancy, second trimester: Secondary | ICD-10-CM

## 2017-02-19 DIAGNOSIS — O99513 Diseases of the respiratory system complicating pregnancy, third trimester: Secondary | ICD-10-CM | POA: Insufficient documentation

## 2017-02-19 DIAGNOSIS — O359XX0 Maternal care for (suspected) fetal abnormality and damage, unspecified, not applicable or unspecified: Secondary | ICD-10-CM

## 2017-02-19 DIAGNOSIS — O283 Abnormal ultrasonic finding on antenatal screening of mother: Secondary | ICD-10-CM

## 2017-02-19 DIAGNOSIS — Z3A29 29 weeks gestation of pregnancy: Secondary | ICD-10-CM | POA: Diagnosis not present

## 2017-02-19 DIAGNOSIS — J4520 Mild intermittent asthma, uncomplicated: Secondary | ICD-10-CM

## 2017-02-19 DIAGNOSIS — O09899 Supervision of other high risk pregnancies, unspecified trimester: Secondary | ICD-10-CM

## 2017-02-19 DIAGNOSIS — J45909 Unspecified asthma, uncomplicated: Secondary | ICD-10-CM | POA: Diagnosis not present

## 2017-02-19 DIAGNOSIS — Z6841 Body Mass Index (BMI) 40.0 and over, adult: Secondary | ICD-10-CM | POA: Diagnosis not present

## 2017-02-19 HISTORY — DX: Other allergy status, other than to drugs and biological substances: Z91.09

## 2017-02-19 LAB — CBC
HEMATOCRIT: 41 % (ref 34.0–46.6)
HEMOGLOBIN: 13.1 g/dL (ref 11.1–15.9)
MCH: 21.7 pg — AB (ref 26.6–33.0)
MCHC: 32 g/dL (ref 31.5–35.7)
MCV: 68 fL — ABNORMAL LOW (ref 79–97)
Platelets: 221 10*3/uL (ref 150–379)
RBC: 6.04 x10E6/uL — AB (ref 3.77–5.28)
RDW: 16.1 % — ABNORMAL HIGH (ref 12.3–15.4)
WBC: 7 10*3/uL (ref 3.4–10.8)

## 2017-02-19 LAB — GLUCOSE TOLERANCE, 2 HOURS W/ 1HR
GLUCOSE, FASTING: 85 mg/dL (ref 65–91)
Glucose, 1 hour: 180 mg/dL — ABNORMAL HIGH (ref 65–179)
Glucose, 2 hour: 130 mg/dL (ref 65–152)

## 2017-02-19 LAB — HIV ANTIBODY (ROUTINE TESTING W REFLEX): HIV Screen 4th Generation wRfx: NONREACTIVE

## 2017-02-19 LAB — RPR: RPR: NONREACTIVE

## 2017-02-20 ENCOUNTER — Other Ambulatory Visit (HOSPITAL_COMMUNITY): Payer: Self-pay | Admitting: *Deleted

## 2017-02-20 ENCOUNTER — Telehealth: Payer: Self-pay

## 2017-02-20 DIAGNOSIS — O09529 Supervision of elderly multigravida, unspecified trimester: Secondary | ICD-10-CM

## 2017-02-20 DIAGNOSIS — O0990 Supervision of high risk pregnancy, unspecified, unspecified trimester: Secondary | ICD-10-CM

## 2017-02-20 NOTE — Telephone Encounter (Signed)
Patient notified

## 2017-02-20 NOTE — Telephone Encounter (Signed)
-----   Message from Shelly Bombard, MD sent at 02/19/2017  1:04 PM EDT ----- Start ADA Diet with Diabetic Teaching

## 2017-03-04 ENCOUNTER — Ambulatory Visit (INDEPENDENT_AMBULATORY_CARE_PROVIDER_SITE_OTHER): Payer: 59 | Admitting: Obstetrics

## 2017-03-04 ENCOUNTER — Encounter: Payer: Self-pay | Admitting: Obstetrics

## 2017-03-04 VITALS — BP 125/77 | HR 68 | Wt 311.8 lb

## 2017-03-04 DIAGNOSIS — O99210 Obesity complicating pregnancy, unspecified trimester: Secondary | ICD-10-CM

## 2017-03-04 DIAGNOSIS — O24410 Gestational diabetes mellitus in pregnancy, diet controlled: Secondary | ICD-10-CM

## 2017-03-04 DIAGNOSIS — O09529 Supervision of elderly multigravida, unspecified trimester: Secondary | ICD-10-CM

## 2017-03-04 DIAGNOSIS — O0990 Supervision of high risk pregnancy, unspecified, unspecified trimester: Secondary | ICD-10-CM

## 2017-03-04 DIAGNOSIS — O09523 Supervision of elderly multigravida, third trimester: Secondary | ICD-10-CM

## 2017-03-04 DIAGNOSIS — O99213 Obesity complicating pregnancy, third trimester: Secondary | ICD-10-CM

## 2017-03-04 DIAGNOSIS — O0993 Supervision of high risk pregnancy, unspecified, third trimester: Secondary | ICD-10-CM

## 2017-03-04 MED ORDER — GLUCOSE BLOOD VI STRP: ORAL_STRIP | 12 refills | Status: DC

## 2017-03-04 NOTE — Progress Notes (Signed)
Patient has some irregular "spasms"- but nothing that is concerning her.

## 2017-03-04 NOTE — Progress Notes (Signed)
Subjective:  Cheryl Acosta is a 41 y.o. [id number] at [redacted]w[redacted]d being seen today for ongoing prenatal care.  She is currently monitored for the following issues for this high-risk pregnancy and has AMA (advanced maternal age) multigravida 13+, unspecified trimester; Increased nuchal translucency space on fetal ultrasound; Supervision of other high risk pregnancy, antepartum; Asthma exacerbation; and Hyperglycemia on her problem list.  Patient reports no complaints.  Contractions: Not present. Vag. Bleeding: None.  Movement: Present. Denies leaking of fluid.   The following portions of the patient's history were reviewed and updated as appropriate: allergies, current medications, past family history, past medical history, past social history, past surgical history and problem list. Problem list updated.  Objective:   Vitals:   03/04/17 1626  BP: 125/77  Pulse: 68  Weight: (!) 311 lb 12.8 oz (141.4 kg)    Fetal Status: Fetal Heart Rate (bpm): 140   Movement: Present     General:  Alert, oriented and cooperative. Patient is in no acute distress.  Skin: Skin is warm and dry. No rash noted.   Cardiovascular: Normal heart rate noted  Respiratory: Normal respiratory effort, no problems with respiration noted  Abdomen: Soft, gravid, appropriate for gestational age. Pain/Pressure: Present     Pelvic:  Cervical exam deferred        Extremities: Normal range of motion.  Edema: None  Mental Status: Normal mood and affect. Normal behavior. Normal judgment and thought content.   Urinalysis:      Assessment and Plan:  Pregnancy: [id number] at [redacted]w[redacted]d  1. Diet controlled gestational diabetes mellitus (GDM), antepartum   2. Supervision of high risk pregnancy, antepartum   3. Elderly multigravida with antepartum condition or complication   4. Obesity affecting pregnancy, antepartum  Preterm labor symptoms and general obstetric precautions including but not limited to vaginal bleeding, contractions,  leaking of fluid and fetal movement were reviewed in detail with the patient. Please refer to After Visit Summary for other counseling recommendations.  Return in about 2 weeks (around 03/18/2017) for [place].   Shelly Bombard, MDPatient ID: Cheryl Acosta, female   DOB: 1976/07/14, 41 y.o.   MRN: [id number]

## 2017-03-06 ENCOUNTER — Encounter: Payer: 59 | Attending: Obstetrics | Admitting: Registered"

## 2017-03-06 DIAGNOSIS — O0990 Supervision of high risk pregnancy, unspecified, unspecified trimester: Secondary | ICD-10-CM | POA: Diagnosis not present

## 2017-03-06 DIAGNOSIS — Z713 Dietary counseling and surveillance: Secondary | ICD-10-CM | POA: Insufficient documentation

## 2017-03-06 DIAGNOSIS — R7309 Other abnormal glucose: Secondary | ICD-10-CM

## 2017-03-11 ENCOUNTER — Encounter: Payer: Self-pay | Admitting: Registered"

## 2017-03-11 NOTE — Progress Notes (Signed)
Patient was seen on 03/06/2017 for Gestational Diabetes self-management class at the [facility]. The following learning objectives were met by the patient during this course:   States the definition of Gestational Diabetes  States why dietary management is important in controlling blood glucose  Describes the effects each nutrient has on blood glucose levels  Demonstrates ability to create a balanced meal plan  Demonstrates carbohydrate counting   States when to check blood glucose levels  Demonstrates proper blood glucose monitoring techniques  States the effect of stress and exercise on blood glucose levels  States the importance of limiting caffeine and abstaining from alcohol and smoking  Blood glucose monitor given: Accu Chek Guide Lot # [id number] Exp: 09/28/17 Blood glucose reading: 80  Patient instructed to monitor glucose levels: FBS: 60 - <95 1 hour: <140 2 hour: <120  Patient received handouts:  Nutrition Diabetes and Pregnancy  Carbohydrate Counting List  Patient will be seen for follow-up as needed.

## 2017-03-19 ENCOUNTER — Ambulatory Visit (HOSPITAL_COMMUNITY): Payer: 59

## 2017-03-20 ENCOUNTER — Ambulatory Visit (HOSPITAL_COMMUNITY)
Admission: RE | Admit: 2017-03-20 | Discharge: 2017-03-20 | Disposition: A | Discharge status: Home / Self Care | Payer: 59 | Source: Ambulatory Visit | Attending: Obstetrics and Gynecology | Admitting: Obstetrics and Gynecology

## 2017-03-20 ENCOUNTER — Ambulatory Visit (INDEPENDENT_AMBULATORY_CARE_PROVIDER_SITE_OTHER): Payer: 59 | Admitting: Obstetrics

## 2017-03-20 ENCOUNTER — Encounter (HOSPITAL_COMMUNITY): Payer: Self-pay

## 2017-03-20 VITALS — BP 128/71 | HR 68 | Wt 314.0 lb

## 2017-03-20 DIAGNOSIS — O24410 Gestational diabetes mellitus in pregnancy, diet controlled: Secondary | ICD-10-CM

## 2017-03-20 DIAGNOSIS — O99213 Obesity complicating pregnancy, third trimester: Secondary | ICD-10-CM | POA: Insufficient documentation

## 2017-03-20 DIAGNOSIS — O99513 Diseases of the respiratory system complicating pregnancy, third trimester: Secondary | ICD-10-CM | POA: Insufficient documentation

## 2017-03-20 DIAGNOSIS — O09529 Supervision of elderly multigravida, unspecified trimester: Secondary | ICD-10-CM

## 2017-03-20 DIAGNOSIS — O09899 Supervision of other high risk pregnancies, unspecified trimester: Secondary | ICD-10-CM

## 2017-03-20 DIAGNOSIS — O09523 Supervision of elderly multigravida, third trimester: Secondary | ICD-10-CM | POA: Diagnosis not present

## 2017-03-20 DIAGNOSIS — Z3A33 33 weeks gestation of pregnancy: Secondary | ICD-10-CM | POA: Diagnosis not present

## 2017-03-20 DIAGNOSIS — J45909 Unspecified asthma, uncomplicated: Secondary | ICD-10-CM | POA: Insufficient documentation

## 2017-03-20 DIAGNOSIS — O99210 Obesity complicating pregnancy, unspecified trimester: Secondary | ICD-10-CM

## 2017-03-20 DIAGNOSIS — O09893 Supervision of other high risk pregnancies, third trimester: Secondary | ICD-10-CM

## 2017-03-20 NOTE — Progress Notes (Signed)
Subjective:  Cheryl Acosta is a 41 y.o. [id number] at [redacted]w[redacted]d being seen today for ongoing prenatal care.  She is currently monitored for the following issues for this high-risk pregnancy and has AMA (advanced maternal age) multigravida 29+, unspecified trimester; Increased nuchal translucency space on fetal ultrasound; Supervision of other high risk pregnancy, antepartum; Asthma exacerbation; and Hyperglycemia on her problem list.  Patient reports no complaints.  Contractions: Not present. Vag. Bleeding: None.  Movement: Present. Denies leaking of fluid.   The following portions of the patient's history were reviewed and updated as appropriate: allergies, current medications, past family history, past medical history, past social history, past surgical history and problem list. Problem list updated.  Objective:   Vitals:   03/20/17 1052  BP: 128/71  Pulse: 68  Weight: (!) 314 lb (142.4 kg)    Fetal Status: Fetal Heart Rate (bpm): 140   Movement: Present     General:  Alert, oriented and cooperative. Patient is in no acute distress.  Skin: Skin is warm and dry. No rash noted.   Cardiovascular: Normal heart rate noted  Respiratory: Normal respiratory effort, no problems with respiration noted  Abdomen: Soft, gravid, appropriate for gestational age. Pain/Pressure: Absent     Pelvic:  Cervical exam deferred        Extremities: Normal range of motion.  Edema: Trace  Mental Status: Normal mood and affect. Normal behavior. Normal judgment and thought content.   Urinalysis:      Assessment and Plan:  Pregnancy: [name] at [redacted]w[redacted]d  1. Supervision of other high risk pregnancy, antepartum Rx: - glucose blood (ACCU-CHEK GUIDE) test strip; 4 times a day  Dispense: 100 each; Refill: 12 - ACCU-CHEK FASTCLIX LANCETS MISC; 1 Device by Does not apply route 4 (four) times daily.  Dispense: 1 each; Refill: 12  2. Elderly multigravida with antepartum condition or complication   3. Obesity affecting  pregnancy, antepartum   4. Diet controlled gestational diabetes mellitus (GDM), antepartum - doing well   Preterm labor symptoms and general obstetric precautions including but not limited to vaginal bleeding, contractions, leaking of fluid and fetal movement were reviewed in detail with the patient. Please refer to After Visit Summary for other counseling recommendations.  Return in about 2 weeks (around 04/03/2017) for [place].   Shelly Bombard, MDPatient reports good fetal movement and ocassional pressure, denies contractions.

## 2017-03-21 ENCOUNTER — Encounter: Payer: Self-pay | Admitting: Obstetrics

## 2017-03-21 MED ORDER — GLUCOSE BLOOD VI STRP: ORAL_STRIP | 12 refills | Status: DC

## 2017-03-21 MED ORDER — ACCU-CHEK FASTCLIX LANCETS MISC: 1.0000 | Freq: Four times a day (QID) | 12 refills | Status: DC

## 2017-03-21 NOTE — Addendum Note (Signed)
Addended by: Baltazar Najjar A on: 03/21/2017 09:04 AM   Modules accepted: Orders

## 2017-04-03 ENCOUNTER — Ambulatory Visit (INDEPENDENT_AMBULATORY_CARE_PROVIDER_SITE_OTHER): Payer: 59 | Admitting: Obstetrics

## 2017-04-03 VITALS — BP 126/89 | HR 80 | Wt 320.0 lb

## 2017-04-03 DIAGNOSIS — E71111 3-methylglutaconic aciduria: Secondary | ICD-10-CM

## 2017-04-03 DIAGNOSIS — O0990 Supervision of high risk pregnancy, unspecified, unspecified trimester: Secondary | ICD-10-CM

## 2017-04-03 DIAGNOSIS — O24410 Gestational diabetes mellitus in pregnancy, diet controlled: Secondary | ICD-10-CM

## 2017-04-03 DIAGNOSIS — Z113 Encounter for screening for infections with a predominantly sexual mode of transmission: Secondary | ICD-10-CM | POA: Diagnosis not present

## 2017-04-03 DIAGNOSIS — Z1389 Encounter for screening for other disorder: Secondary | ICD-10-CM

## 2017-04-03 DIAGNOSIS — O09529 Supervision of elderly multigravida, unspecified trimester: Secondary | ICD-10-CM

## 2017-04-03 DIAGNOSIS — Z23 Encounter for immunization: Secondary | ICD-10-CM | POA: Diagnosis not present

## 2017-04-03 DIAGNOSIS — O09523 Supervision of elderly multigravida, third trimester: Secondary | ICD-10-CM

## 2017-04-03 DIAGNOSIS — O99513 Diseases of the respiratory system complicating pregnancy, third trimester: Secondary | ICD-10-CM

## 2017-04-03 DIAGNOSIS — O99213 Obesity complicating pregnancy, third trimester: Secondary | ICD-10-CM

## 2017-04-03 DIAGNOSIS — O99210 Obesity complicating pregnancy, unspecified trimester: Secondary | ICD-10-CM

## 2017-04-03 DIAGNOSIS — J45909 Unspecified asthma, uncomplicated: Secondary | ICD-10-CM

## 2017-04-03 DIAGNOSIS — O99519 Diseases of the respiratory system complicating pregnancy, unspecified trimester: Secondary | ICD-10-CM

## 2017-04-03 LAB — OB RESULTS CONSOLE GC/CHLAMYDIA
CHLAMYDIA, DNA PROBE: NEGATIVE
Gonorrhea: NEGATIVE

## 2017-04-03 NOTE — Progress Notes (Signed)
Patient wants to discuss pain management during delivery.

## 2017-04-03 NOTE — Progress Notes (Signed)
Subjective:  Cheryl Acosta is a 41 y.o. [id number] at [redacted]w[redacted]d being seen today for ongoing prenatal care.  She is currently monitored for the following issues for this high-risk pregnancy and has AMA (advanced maternal age) multigravida 12+, unspecified trimester; Increased nuchal translucency space on fetal ultrasound; Supervision of other high risk pregnancy, antepartum; Asthma exacerbation; and Hyperglycemia on her problem list.  Patient reports no complaints.  Contractions: Not present. Vag. Bleeding: None.  Movement: Present. Denies leaking of fluid.   The following portions of the patient's history were reviewed and updated as appropriate: allergies, current medications, past family history, past medical history, past social history, past surgical history and problem list. Problem list updated.  Objective:   Vitals:   04/03/17 1617  BP: 126/89  Pulse: 80  Weight: (!) 320 lb (145.2 kg)    Fetal Status:     Movement: Present     General:  Alert, oriented and cooperative. Patient is in no acute distress.  Skin: Skin is warm and dry. No rash noted.   Cardiovascular: Normal heart rate noted  Respiratory: Normal respiratory effort, no problems with respiration noted  Abdomen: Soft, gravid, appropriate for gestational age. Pain/Pressure: Absent     Pelvic:  Cervical exam deferred        Extremities: Normal range of motion.  Edema: Mild pitting, slight indentation  Mental Status: Normal mood and affect. Normal behavior. Normal judgment and thought content.   Urinalysis:      Assessment and Plan:  Pregnancy: [id number] at [redacted]w[redacted]d  1. Supervision of high risk pregnancy, antepartum Rx: - Strep Gp B NAA - Cervicovaginal ancillary only - Tdap vaccine greater than or equal to 7yo IM  2. Elderly multigravida with antepartum condition or complication   3. Obesity affecting pregnancy, antepartum   4. Diet controlled gestational diabetes mellitus (GDM), antepartum - doing well  5. Asthma  affecting pregnancy, antepartum - stable  Preterm labor symptoms and general obstetric precautions including but not limited to vaginal bleeding, contractions, leaking of fluid and fetal movement were reviewed in detail with the patient. Please refer to After Visit Summary for other counseling recommendations.  Return in about 1 week (around 04/10/2017) for [place].   Shelly Bombard, MD

## 2017-04-05 LAB — CERVICOVAGINAL ANCILLARY ONLY
BACTERIAL VAGINITIS: NEGATIVE
CANDIDA VAGINITIS: NEGATIVE
Trichomonas: NEGATIVE

## 2017-04-05 LAB — STREP GP B NAA: STREP GROUP B AG: NEGATIVE

## 2017-04-09 ENCOUNTER — Encounter: Payer: Self-pay | Admitting: Obstetrics

## 2017-04-09 ENCOUNTER — Telehealth: Payer: Self-pay | Admitting: Obstetrics

## 2017-04-09 NOTE — Telephone Encounter (Signed)
Patient called, stated she was having swollen painful feet and she states she was told by the RN that she could start her Maternity Leave whenever she was ready.  She is called back now and states she wants to go to half days starting 04/16/17.  She was just calling to state she was going to have FMLA forms faxed over.  Patient has an appointment this Thursday and will discuss with provider at this visit.

## 2017-04-11 ENCOUNTER — Encounter: Payer: Self-pay | Admitting: Obstetrics

## 2017-04-11 ENCOUNTER — Ambulatory Visit (INDEPENDENT_AMBULATORY_CARE_PROVIDER_SITE_OTHER): Payer: 59 | Admitting: Obstetrics

## 2017-04-11 VITALS — BP 113/65 | HR 71 | Wt 319.5 lb

## 2017-04-11 DIAGNOSIS — O09529 Supervision of elderly multigravida, unspecified trimester: Secondary | ICD-10-CM

## 2017-04-11 DIAGNOSIS — Z113 Encounter for screening for infections with a predominantly sexual mode of transmission: Secondary | ICD-10-CM

## 2017-04-11 DIAGNOSIS — O09523 Supervision of elderly multigravida, third trimester: Secondary | ICD-10-CM

## 2017-04-11 DIAGNOSIS — O99210 Obesity complicating pregnancy, unspecified trimester: Secondary | ICD-10-CM

## 2017-04-11 DIAGNOSIS — J45909 Unspecified asthma, uncomplicated: Secondary | ICD-10-CM

## 2017-04-11 DIAGNOSIS — O24410 Gestational diabetes mellitus in pregnancy, diet controlled: Secondary | ICD-10-CM

## 2017-04-11 DIAGNOSIS — O99213 Obesity complicating pregnancy, third trimester: Secondary | ICD-10-CM

## 2017-04-11 DIAGNOSIS — Z331 Pregnant state, incidental: Secondary | ICD-10-CM | POA: Diagnosis not present

## 2017-04-11 DIAGNOSIS — O0993 Supervision of high risk pregnancy, unspecified, third trimester: Secondary | ICD-10-CM

## 2017-04-11 DIAGNOSIS — O99519 Diseases of the respiratory system complicating pregnancy, unspecified trimester: Secondary | ICD-10-CM

## 2017-04-11 DIAGNOSIS — O0990 Supervision of high risk pregnancy, unspecified, unspecified trimester: Secondary | ICD-10-CM

## 2017-04-11 DIAGNOSIS — O99513 Diseases of the respiratory system complicating pregnancy, third trimester: Secondary | ICD-10-CM

## 2017-04-11 MED ORDER — ALBUTEROL SULFATE HFA 108 (90 BASE) MCG/ACT IN AERS: 2.0000 | INHALATION_SPRAY | Freq: Four times a day (QID) | RESPIRATORY_TRACT | 5 refills | Status: DC | PRN

## 2017-04-11 NOTE — Progress Notes (Signed)
Subjective:  Cheryl Acosta is a 41 y.o. [id number] at [redacted]w[redacted]d being seen today for ongoing prenatal care.  She is currently monitored for the following issues for this high-risk pregnancy and has AMA (advanced maternal age) multigravida 21+, unspecified trimester; Increased nuchal translucency space on fetal ultrasound; Supervision of other high risk pregnancy, antepartum; Asthma exacerbation; and Hyperglycemia on her problem list.  Patient reports no complaints.  Contractions: Not present. Vag. Bleeding: None.  Movement: Present. Denies leaking of fluid.   The following portions of the patient's history were reviewed and updated as appropriate: allergies, current medications, past family history, past medical history, past social history, past surgical history and problem list. Problem list updated.  Objective:   Vitals:   04/11/17 1006  BP: 113/65  Pulse: 71  Weight: (!) 319 lb 8 oz (144.9 kg)    Fetal Status: Fetal Heart Rate (bpm): 140   Movement: Present     General:  Alert, oriented and cooperative. Patient is in no acute distress.  Skin: Skin is warm and dry. No rash noted.   Cardiovascular: Normal heart rate noted  Respiratory: Normal respiratory effort, no problems with respiration noted  Abdomen: Soft, gravid, appropriate for gestational age. Pain/Pressure: Present     Pelvic:  Cervical exam deferred        Extremities: Normal range of motion.  Edema: Mild pitting, slight indentation  Mental Status: Normal mood and affect. Normal behavior. Normal judgment and thought content.   Urinalysis:      Assessment and Plan:  Pregnancy: [id number] at [redacted]w[redacted]d  1. Supervision of other high risk pregnancy, antepartum   Term labor symptoms and general obstetric precautions including but not limited to vaginal bleeding, contractions, leaking of fluid and fetal movement were reviewed in detail with the patient. Please refer to After Visit Summary for other counseling recommendations.  Return in  about 1 week (around 04/18/2017) for ROB.   Shelly Bombard, MD

## 2017-04-11 NOTE — Progress Notes (Signed)
Patient wants to stop working on 04/16/2017. Want Rx for Inhaler.

## 2017-04-16 ENCOUNTER — Other Ambulatory Visit (HOSPITAL_COMMUNITY): Payer: Self-pay | Admitting: Obstetrics and Gynecology

## 2017-04-16 ENCOUNTER — Ambulatory Visit (HOSPITAL_COMMUNITY)
Admission: RE | Admit: 2017-04-16 | Discharge: 2017-04-16 | Disposition: A | Discharge status: Home / Self Care | Payer: 59 | Source: Ambulatory Visit | Attending: Obstetrics | Admitting: Obstetrics

## 2017-04-16 ENCOUNTER — Encounter (HOSPITAL_COMMUNITY): Payer: Self-pay

## 2017-04-16 ENCOUNTER — Encounter: Payer: Self-pay | Admitting: Obstetrics

## 2017-04-16 ENCOUNTER — Ambulatory Visit (INDEPENDENT_AMBULATORY_CARE_PROVIDER_SITE_OTHER): Payer: 59 | Admitting: Obstetrics

## 2017-04-16 VITALS — BP 128/82 | HR 76 | Wt 315.8 lb

## 2017-04-16 DIAGNOSIS — O09523 Supervision of elderly multigravida, third trimester: Secondary | ICD-10-CM

## 2017-04-16 DIAGNOSIS — O09893 Supervision of other high risk pregnancies, third trimester: Secondary | ICD-10-CM | POA: Diagnosis present

## 2017-04-16 DIAGNOSIS — O99210 Obesity complicating pregnancy, unspecified trimester: Secondary | ICD-10-CM

## 2017-04-16 DIAGNOSIS — Z3689 Encounter for other specified antenatal screening: Secondary | ICD-10-CM | POA: Insufficient documentation

## 2017-04-16 DIAGNOSIS — J45909 Unspecified asthma, uncomplicated: Secondary | ICD-10-CM | POA: Insufficient documentation

## 2017-04-16 DIAGNOSIS — Z3A37 37 weeks gestation of pregnancy: Secondary | ICD-10-CM | POA: Diagnosis present

## 2017-04-16 DIAGNOSIS — O09529 Supervision of elderly multigravida, unspecified trimester: Secondary | ICD-10-CM

## 2017-04-16 DIAGNOSIS — O24410 Gestational diabetes mellitus in pregnancy, diet controlled: Secondary | ICD-10-CM

## 2017-04-16 DIAGNOSIS — O99213 Obesity complicating pregnancy, third trimester: Secondary | ICD-10-CM | POA: Insufficient documentation

## 2017-04-16 DIAGNOSIS — O99519 Diseases of the respiratory system complicating pregnancy, unspecified trimester: Secondary | ICD-10-CM

## 2017-04-16 DIAGNOSIS — O359XX0 Maternal care for (suspected) fetal abnormality and damage, unspecified, not applicable or unspecified: Secondary | ICD-10-CM

## 2017-04-16 DIAGNOSIS — Z3483 Encounter for supervision of other normal pregnancy, third trimester: Secondary | ICD-10-CM

## 2017-04-16 DIAGNOSIS — O99513 Diseases of the respiratory system complicating pregnancy, third trimester: Secondary | ICD-10-CM | POA: Insufficient documentation

## 2017-04-16 DIAGNOSIS — O0990 Supervision of high risk pregnancy, unspecified, unspecified trimester: Secondary | ICD-10-CM

## 2017-04-16 DIAGNOSIS — O0993 Supervision of high risk pregnancy, unspecified, third trimester: Secondary | ICD-10-CM

## 2017-04-16 NOTE — Progress Notes (Signed)
Patient is ready to stop work. She wants to discuss her delivery.

## 2017-04-16 NOTE — Progress Notes (Signed)
Subjective:  Cheryl Acosta is a 41 y.o. [id number] at [redacted]w[redacted]d being seen today for ongoing prenatal care.  She is currently monitored for the following issues for this high-risk pregnancy and has AMA (advanced maternal age) multigravida 62+, unspecified trimester; Increased nuchal translucency space on fetal ultrasound; Supervision of other high risk pregnancy, antepartum; Asthma exacerbation; and Hyperglycemia on her problem list.  Patient reports occasional contractions.  Contractions: Irregular. Vag. Bleeding: None.  Movement: Present. Denies leaking of fluid.   The following portions of the patient's history were reviewed and updated as appropriate: allergies, current medications, past family history, past medical history, past social history, past surgical history and problem list. Problem list updated.  Objective:   Vitals:   04/16/17 0940  BP: 128/82  Pulse: 76  Weight: (!) 315 lb 12.8 oz (143.2 kg)    Fetal Status: Fetal Heart Rate (bpm): 140   Movement: Present     General:  Alert, oriented and cooperative. Patient is in no acute distress.  Skin: Skin is warm and dry. No rash noted.   Cardiovascular: Normal heart rate noted  Respiratory: Normal respiratory effort, no problems with respiration noted  Abdomen: Soft, gravid, appropriate for gestational age. Pain/Pressure: Present     Pelvic:  Cervical exam deferred        Extremities: Normal range of motion.  Edema: None  Mental Status: Normal mood and affect. Normal behavior. Normal judgment and thought content.   Urinalysis: Urine Protein: Trace Urine Glucose: Negative  Assessment and Plan:  Pregnancy: [id number] at [redacted]w[redacted]d  1. Supervision of high risk pregnancy, antepartum   2. Elderly multigravida with antepartum condition or complication   3. Obesity affecting pregnancy, antepartum   4. Diet controlled gestational diabetes mellitus (GDM), antepartum   5. Asthma affecting pregnancy, antepartum   Term labor symptoms and  general obstetric precautions including but not limited to vaginal bleeding, contractions, leaking of fluid and fetal movement were reviewed in detail with the patient. Please refer to After Visit Summary for other counseling recommendations.  Return in about 1 week (around 04/23/2017) for ROB.   Shelly Bombard, MD

## 2017-04-23 ENCOUNTER — Ambulatory Visit (INDEPENDENT_AMBULATORY_CARE_PROVIDER_SITE_OTHER): Payer: 59 | Admitting: Family Medicine

## 2017-04-23 VITALS — BP 131/76 | HR 87 | Wt 321.9 lb

## 2017-04-23 DIAGNOSIS — O09899 Supervision of other high risk pregnancies, unspecified trimester: Secondary | ICD-10-CM

## 2017-04-23 DIAGNOSIS — O24419 Gestational diabetes mellitus in pregnancy, unspecified control: Secondary | ICD-10-CM | POA: Insufficient documentation

## 2017-04-23 DIAGNOSIS — O09523 Supervision of elderly multigravida, third trimester: Secondary | ICD-10-CM

## 2017-04-23 DIAGNOSIS — O09893 Supervision of other high risk pregnancies, third trimester: Secondary | ICD-10-CM

## 2017-04-23 DIAGNOSIS — O09529 Supervision of elderly multigravida, unspecified trimester: Secondary | ICD-10-CM

## 2017-04-23 DIAGNOSIS — O24410 Gestational diabetes mellitus in pregnancy, diet controlled: Secondary | ICD-10-CM

## 2017-04-23 NOTE — Progress Notes (Signed)
   PRENATAL VISIT NOTE  Subjective:  Cheryl Acosta is a 41 y.o. [id number] at [redacted]w[redacted]d being seen today for ongoing prenatal care.  She is currently monitored for the following issues for this high-risk Cheryl Acosta and has AMA (advanced maternal age) multigravida 40+, unspecified trimester; Increased nuchal translucency space on fetal ultrasound; Supervision of other high risk Cheryl Acosta, antepartum; Asthma exacerbation; Hyperglycemia; and Gestational diabetes on her problem list.  Patient reports no complaints.  Contractions: Irritability. Vag. Bleeding: None.  Movement: Present. Denies leaking of fluid.   The following portions of the patient's history were reviewed and updated as appropriate: allergies, current medications, past family history, past medical history, past social history, past surgical history and problem list. Problem list updated.  Objective:   Vitals:   04/23/17 1148  BP: 131/76  Pulse: 87  Weight: (!) 321 lb 14.4 oz (146 kg)    Fetal Status: Fetal Heart Rate (bpm): 140   Movement: Present  Presentation: Vertex  General:  Alert, oriented and cooperative. Patient is in no acute distress.  Skin: Skin is warm and dry. No rash noted.   Cardiovascular: Normal heart rate noted  Respiratory: Normal respiratory effort, no problems with respiration noted  Abdomen: Soft, gravid, appropriate for gestational age.  Pain/Pressure: Present     Pelvic: Cervical exam performed Dilation: 2 Effacement (%): 20 Station: -3  Extremities: Normal range of motion.  Edema: None  Mental Status:  Normal mood and affect. Normal behavior. Normal judgment and thought content.  U/s 9/4 vtx, AFI 17.49, EFW 8 lb 5 oz (90%) CBG are ok--some up after meals and notes some cheating on her diet. Assessment and Plan:  Cheryl Acosta: [id number] at [redacted]w[redacted]d  1. AMA (advanced maternal age) multigravida 31+, unspecified trimester   2. Supervision of other high risk Cheryl Acosta, antepartum Continue prenatal care.  3. Diet  controlled gestational diabetes mellitus (GDM) in third trimester Poor control and non-compliance LGA--will schedule for IOL at 39 wks.  Term labor symptoms and general obstetric precautions including but not limited to vaginal bleeding, contractions, leaking of fluid and fetal movement were reviewed in detail with the patient. Please refer to After Visit Summary for other counseling recommendations.  Return in 1 week (on 04/30/2017).   [name], MD

## 2017-04-23 NOTE — Patient Instructions (Signed)
 Third Trimester of Pregnancy The third trimester is from week 28 through week 40 (months 7 through 9). The third trimester is a time when the unborn baby (fetus) is growing rapidly. At the end of the ninth month, the fetus is about 20 inches in length and weighs 6-10 pounds. Body changes during your third trimester Your body will continue to go through many changes during pregnancy. The changes vary from woman to woman. During the third trimester:  Your weight will continue to increase. You can expect to gain 25-35 pounds (11-16 kg) by the end of the pregnancy.  You may begin to get stretch marks on your hips, abdomen, and breasts.  You may urinate more often because the fetus is moving lower into your pelvis and pressing on your bladder.  You may develop or continue to have heartburn. This is caused by increased hormones that slow down muscles in the digestive tract.  You may develop or continue to have constipation because increased hormones slow digestion and cause the muscles that push waste through your intestines to relax.  You may develop hemorrhoids. These are swollen veins (varicose veins) in the rectum that can itch or be painful.  You may develop swollen, bulging veins (varicose veins) in your legs.  You may have increased body aches in the pelvis, back, or thighs. This is due to weight gain and increased hormones that are relaxing your joints.  You may have changes in your hair. These can include thickening of your hair, rapid growth, and changes in texture. Some women also have hair loss during or after pregnancy, or hair that feels dry or thin. Your hair will most likely return to normal after your baby is born.  Your breasts will continue to grow and they will continue to become tender. A yellow fluid (colostrum) may leak from your breasts. This is the first milk you are producing for your baby.  Your belly button may stick out.  You may notice more swelling in your  hands, face, or ankles.  You may have increased tingling or numbness in your hands, arms, and legs. The skin on your belly may also feel numb.  You may feel short of breath because of your expanding uterus.  You may have more problems sleeping. This can be caused by the size of your belly, increased need to urinate, and an increase in your body's metabolism.  You may notice the fetus "dropping," or moving lower in your abdomen (lightening).  You may have increased vaginal discharge.  You may notice your joints feel loose and you may have pain around your pelvic bone.  What to expect at prenatal visits You will have prenatal exams every 2 weeks until week 36. Then you will have weekly prenatal exams. During a routine prenatal visit:  You will be weighed to make sure you and the baby are growing normally.  Your blood pressure will be taken.  Your abdomen will be measured to track your baby's growth.  The fetal heartbeat will be listened to.  Any test results from the previous visit will be discussed.  You may have a cervical check near your due date to see if your cervix has softened or thinned (effaced).  You will be tested for Group B streptococcus. This happens between 35 and 37 weeks.  Your health care provider may ask you:  What your birth plan is.  How you are feeling.  If you are feeling the baby move.  If you have   had any abnormal symptoms, such as leaking fluid, bleeding, severe headaches, or abdominal cramping.  If you are using any tobacco products, including cigarettes, chewing tobacco, and electronic cigarettes.  If you have any questions.  Other tests or screenings that may be performed during your third trimester include:  Blood tests that check for low iron levels (anemia).  Fetal testing to check the health, activity level, and growth of the fetus. Testing is done if you have certain medical conditions or if there are problems during the  pregnancy.  Nonstress test (NST). This test checks the health of your baby to make sure there are no signs of problems, such as the baby not getting enough oxygen. During this test, a belt is placed around your belly. The baby is made to move, and its heart rate is monitored during movement.  What is false labor? False labor is a condition in which you feel small, irregular tightenings of the muscles in the womb (contractions) that usually go away with rest, changing position, or drinking water. These are called Braxton Hicks contractions. Contractions may last for hours, days, or even weeks before true labor sets in. If contractions come at regular intervals, become more frequent, increase in intensity, or become painful, you should see your health care provider. What are the signs of labor?  Abdominal cramps.  Regular contractions that start at 10 minutes apart and become stronger and more frequent with time.  Contractions that start on the top of the uterus and spread down to the lower abdomen and back.  Increased pelvic pressure and dull back pain.  A watery or bloody mucus discharge that comes from the vagina.  Leaking of amniotic fluid. This is also known as your "water breaking." It could be a slow trickle or a gush. Let your health care provider know if it has a color or strange odor. If you have any of these signs, call your health care provider right away, even if it is before your due date. Follow these instructions at home: Medicines  Follow your health care provider's instructions regarding medicine use. Specific medicines may be either safe or unsafe to take during pregnancy.  Take a prenatal vitamin that contains at least 600 micrograms (mcg) of folic acid.  If you develop constipation, try taking a stool softener if your health care provider approves. Eating and drinking  Eat a balanced diet that includes fresh fruits and vegetables, whole grains, good sources of protein  such as meat, eggs, or tofu, and low-fat dairy. Your health care provider will help you determine the amount of weight gain that is right for you.  Avoid raw meat and uncooked cheese. These carry germs that can cause birth defects in the baby.  If you have low calcium intake from food, talk to your health care provider about whether you should take a daily calcium supplement.  Eat four or five small meals rather than three large meals a day.  Limit foods that are high in fat and processed sugars, such as fried and sweet foods.  To prevent constipation: ? Drink enough fluid to keep your urine clear or pale yellow. ? Eat foods that are high in fiber, such as fresh fruits and vegetables, whole grains, and beans. Activity  Exercise only as directed by your health care provider. Most women can continue their usual exercise routine during pregnancy. Try to exercise for 30 minutes at least 5 days a week. Stop exercising if you experience uterine contractions.  Avoid   heavy lifting.  Do not exercise in extreme heat or humidity, or at high altitudes.  Wear low-heel, comfortable shoes.  Practice good posture.  You may continue to have sex unless your health care provider tells you otherwise. Relieving pain and discomfort  Take frequent breaks and rest with your legs elevated if you have leg cramps or low back pain.  Take warm sitz baths to soothe any pain or discomfort caused by hemorrhoids. Use hemorrhoid cream if your health care provider approves.  Wear a good support bra to prevent discomfort from breast tenderness.  If you develop varicose veins: ? Wear support pantyhose or compression stockings as told by your healthcare provider. ? Elevate your feet for 15 minutes, 3-4 times a day. Prenatal care  Write down your questions. Take them to your prenatal visits.  Keep all your prenatal visits as told by your health care provider. This is important. Safety  Wear your seat belt at  all times when driving.  Make a list of emergency phone numbers, including numbers for family, friends, the hospital, and police and fire departments. General instructions  Avoid cat litter boxes and soil used by cats. These carry germs that can cause birth defects in the baby. If you have a cat, ask someone to clean the litter box for you.  Do not travel far distances unless it is absolutely necessary and only with the approval of your health care provider.  Do not use hot tubs, steam rooms, or saunas.  Do not drink alcohol.  Do not use any products that contain nicotine or tobacco, such as cigarettes and e-cigarettes. If you need help quitting, ask your health care provider.  Do not use any medicinal herbs or unprescribed drugs. These chemicals affect the formation and growth of the baby.  Do not douche or use tampons or scented sanitary pads.  Do not cross your legs for long periods of time.  To prepare for the arrival of your baby: ? Take prenatal classes to understand, practice, and ask questions about labor and delivery. ? Make a trial run to the hospital. ? Visit the hospital and tour the maternity area. ? Arrange for maternity or paternity leave through employers. ? Arrange for family and friends to take care of pets while you are in the hospital. ? Purchase a rear-facing car seat and make sure you know how to install it in your car. ? Pack your hospital bag. ? Prepare the baby's nursery. Make sure to remove all pillows and stuffed animals from the baby's crib to prevent suffocation.  Visit your dentist if you have not gone during your pregnancy. Use a soft toothbrush to brush your teeth and be gentle when you floss. Contact a health care provider if:  You are unsure if you are in labor or if your water has broken.  You become dizzy.  You have mild pelvic cramps, pelvic pressure, or nagging pain in your abdominal area.  You have lower back pain.  You have persistent  nausea, vomiting, or diarrhea.  You have an unusual or bad smelling vaginal discharge.  You have pain when you urinate. Get help right away if:  Your water breaks before 37 weeks.  You have regular contractions less than 5 minutes apart before 37 weeks.  You have a fever.  You are leaking fluid from your vagina.  You have spotting or bleeding from your vagina.  You have severe abdominal pain or cramping.  You have rapid weight loss or weight   gain.  You have shortness of breath with chest pain.  You notice sudden or extreme swelling of your face, hands, ankles, feet, or legs.  Your baby makes fewer than 10 movements in 2 hours.  You have severe headaches that do not go away when you take medicine.  You have vision changes. Summary  The third trimester is from week 28 through week 40, months 7 through 9. The third trimester is a time when the unborn baby (fetus) is growing rapidly.  During the third trimester, your discomfort may increase as you and your baby continue to gain weight. You may have abdominal, leg, and back pain, sleeping problems, and an increased need to urinate.  During the third trimester your breasts will keep growing and they will continue to become tender. A yellow fluid (colostrum) may leak from your breasts. This is the first milk you are producing for your baby.  False labor is a condition in which you feel small, irregular tightenings of the muscles in the womb (contractions) that eventually go away. These are called Braxton Hicks contractions. Contractions may last for hours, days, or even weeks before true labor sets in.  Signs of labor can include: abdominal cramps; regular contractions that start at 10 minutes apart and become stronger and more frequent with time; watery or bloody mucus discharge that comes from the vagina; increased pelvic pressure and dull back pain; and leaking of amniotic fluid. This information is not intended to replace advice  given to you by your health care provider. Make sure you discuss any questions you have with your health care provider. Document Released: 07/24/2001 Document Revised: 01/05/2016 Document Reviewed: 09/30/2012 [employer] Interactive Patient Education  2017 [employer]   Breastfeeding Deciding to breastfeed is one of the best choices you can make for you and your baby. A change in hormones during pregnancy causes your breast tissue to grow and increases the number and size of your milk ducts. These hormones also allow proteins, sugars, and fats from your blood supply to make breast milk in your milk-producing glands. Hormones prevent breast milk from being released before your baby is born as well as prompt milk flow after birth. Once breastfeeding has begun, thoughts of your baby, as well as his or her sucking or crying, can stimulate the release of milk from your milk-producing glands. Benefits of breastfeeding For Your Baby  Your first milk (colostrum) helps your baby's digestive system function better.  There are antibodies in your milk that help your baby fight off infections.  Your baby has a lower incidence of asthma, allergies, and sudden infant death syndrome.  The nutrients in breast milk are better for your baby than infant formulas and are designed uniquely for your baby's needs.  Breast milk improves your baby's brain development.  Your baby is less likely to develop other conditions, such as childhood obesity, asthma, or type 2 diabetes mellitus.  For You  Breastfeeding helps to create a very special bond between you and your baby.  Breastfeeding is convenient. Breast milk is always available at the correct temperature and costs nothing.  Breastfeeding helps to burn calories and helps you lose the weight gained during pregnancy.  Breastfeeding makes your uterus contract to its prepregnancy size faster and slows bleeding (lochia) after you give birth.  Breastfeeding helps  to lower your risk of developing type 2 diabetes mellitus, osteoporosis, and breast or ovarian cancer later in life.  Signs that your baby is hungry Early Signs of Hunger    Increased alertness or activity.  Stretching.  Movement of the head from side to side.  Movement of the head and opening of the mouth when the corner of the mouth or cheek is stroked (rooting).  Increased sucking sounds, smacking lips, cooing, sighing, or squeaking.  Hand-to-mouth movements.  Increased sucking of fingers or hands.  Late Signs of Hunger  Fussing.  Intermittent crying.  Extreme Signs of Hunger Signs of extreme hunger will require calming and consoling before your baby will be able to breastfeed successfully. Do not wait for the following signs of extreme hunger to occur before you initiate breastfeeding:  Restlessness.  A loud, strong cry.  Screaming.  Breastfeeding basics Breastfeeding Initiation  Find a comfortable place to sit or lie down, with your neck and back well supported.  Place a pillow or rolled up blanket under your baby to bring him or her to the level of your breast (if you are seated). Nursing pillows are specially designed to help support your arms and your baby while you breastfeed.  Make sure that your baby's abdomen is facing your abdomen.  Gently massage your breast. With your fingertips, massage from your chest wall toward your nipple in a circular motion. This encourages milk flow. You may need to continue this action during the feeding if your milk flows slowly.  Support your breast with 4 fingers underneath and your thumb above your nipple. Make sure your fingers are well away from your nipple and your baby's mouth.  Stroke your baby's lips gently with your finger or nipple.  When your baby's mouth is open wide enough, quickly bring your baby to your breast, placing your entire nipple and as much of the colored area around your nipple (areola) as possible into  your baby's mouth. ? More areola should be visible above your baby's upper lip than below the lower lip. ? Your baby's tongue should be between his or her lower gum and your breast.  Ensure that your baby's mouth is correctly positioned around your nipple (latched). Your baby's lips should create a seal on your breast and be turned out (everted).  It is common for your baby to suck about 2-3 minutes in order to start the flow of breast milk.  Latching Teaching your baby how to latch on to your breast properly is very important. An improper latch can cause nipple pain and decreased milk supply for you and poor weight gain in your baby. Also, if your baby is not latched onto your nipple properly, he or she may swallow some air during feeding. This can make your baby fussy. Burping your baby when you switch breasts during the feeding can help to get rid of the air. However, teaching your baby to latch on properly is still the best way to prevent fussiness from swallowing air while breastfeeding. Signs that your baby has successfully latched on to your nipple:  Silent tugging or silent sucking, without causing you pain.  Swallowing heard between every 3-4 sucks.  Muscle movement above and in front of his or her ears while sucking.  Signs that your baby has not successfully latched on to nipple:  Sucking sounds or smacking sounds from your baby while breastfeeding.  Nipple pain.  If you think your baby has not latched on correctly, slip your finger into the corner of your baby's mouth to break the suction and place it between your baby's gums. Attempt breastfeeding initiation again. Signs of Successful Breastfeeding Signs from your baby:  A   gradual decrease in the number of sucks or complete cessation of sucking.  Falling asleep.  Relaxation of his or her body.  Retention of a small amount of milk in his or her mouth.  Letting go of your breast by himself or herself.  Signs from  you:  Breasts that have increased in firmness, weight, and size 1-3 hours after feeding.  Breasts that are softer immediately after breastfeeding.  Increased milk volume, as well as a change in milk consistency and color by the fifth day of breastfeeding.  Nipples that are not sore, cracked, or bleeding.  Signs That Your Baby is Getting Enough Milk  Wetting at least 1-2 diapers during the first 24 hours after birth.  Wetting at least 5-6 diapers every 24 hours for the first week after birth. The urine should be clear or pale yellow by 5 days after birth.  Wetting 6-8 diapers every 24 hours as your baby continues to grow and develop.  At least 3 stools in a 24-hour period by age 5 days. The stool should be soft and yellow.  At least 3 stools in a 24-hour period by age 7 days. The stool should be seedy and yellow.  No loss of weight greater than 10% of birth weight during the first 3 days of age.  Average weight gain of 4-7 ounces (113-198 g) per week after age 4 days.  Consistent daily weight gain by age 5 days, without weight loss after the age of 2 weeks.  After a feeding, your baby may spit up a small amount. This is common. Breastfeeding frequency and duration Frequent feeding will help you make more milk and can prevent sore nipples and breast engorgement. Breastfeed when you feel the need to reduce the fullness of your breasts or when your baby shows signs of hunger. This is called "breastfeeding on demand." Avoid introducing a pacifier to your baby while you are working to establish breastfeeding (the first 4-6 weeks after your baby is born). After this time you may choose to use a pacifier. Research has shown that pacifier use during the first year of a baby's life decreases the risk of sudden infant death syndrome (SIDS). Allow your baby to feed on each breast as long as he or she wants. Breastfeed until your baby is finished feeding. When your baby unlatches or falls asleep  while feeding from the first breast, offer the second breast. Because newborns are often sleepy in the first few weeks of life, you may need to awaken your baby to get him or her to feed. Breastfeeding times will vary from baby to baby. However, the following rules can serve as a guide to help you ensure that your baby is properly fed:  Newborns (babies 4 weeks of age or younger) may breastfeed every 1-3 hours.  Newborns should not go longer than 3 hours during the day or 5 hours during the night without breastfeeding.  You should breastfeed your baby a minimum of 8 times in a 24-hour period until you begin to introduce solid foods to your baby at around 6 months of age.  Breast milk pumping Pumping and storing breast milk allows you to ensure that your baby is exclusively fed your breast milk, even at times when you are unable to breastfeed. This is especially important if you are going back to work while you are still breastfeeding or when you are not able to be present during feedings. Your lactation consultant can give you guidelines on how   long it is safe to store breast milk. A breast pump is a machine that allows you to pump milk from your breast into a sterile bottle. The pumped breast milk can then be stored in a refrigerator or freezer. Some breast pumps are operated by hand, while others use electricity. Ask your lactation consultant which type will work best for you. Breast pumps can be purchased, but some hospitals and breastfeeding support groups lease breast pumps on a monthly basis. A lactation consultant can teach you how to hand express breast milk, if you prefer not to use a pump. Caring for your breasts while you breastfeed Nipples can become dry, cracked, and sore while breastfeeding. The following recommendations can help keep your breasts moisturized and healthy:  Avoid using soap on your nipples.  Wear a supportive bra. Although not required, special nursing bras and tank  tops are designed to allow access to your breasts for breastfeeding without taking off your entire bra or top. Avoid wearing underwire-style bras or extremely tight bras.  Air dry your nipples for 3-4minutes after each feeding.  Use only cotton bra pads to absorb leaked breast milk. Leaking of breast milk between feedings is normal.  Use lanolin on your nipples after breastfeeding. Lanolin helps to maintain your skin's normal moisture barrier. If you use pure lanolin, you do not need to wash it off before feeding your baby again. Pure lanolin is not toxic to your baby. You may also hand express a few drops of breast milk and gently massage that milk into your nipples and allow the milk to air dry.  In the first few weeks after giving birth, some women experience extremely full breasts (engorgement). Engorgement can make your breasts feel heavy, warm, and tender to the touch. Engorgement peaks within 3-5 days after you give birth. The following recommendations can help ease engorgement:  Completely empty your breasts while breastfeeding or pumping. You may want to start by applying warm, moist heat (in the shower or with warm water-soaked hand towels) just before feeding or pumping. This increases circulation and helps the milk flow. If your baby does not completely empty your breasts while breastfeeding, pump any extra milk after he or she is finished.  Wear a snug bra (nursing or regular) or tank top for 1-2 days to signal your body to slightly decrease milk production.  Apply ice packs to your breasts, unless this is too uncomfortable for you.  Make sure that your baby is latched on and positioned properly while breastfeeding.  If engorgement persists after 48 hours of following these recommendations, contact your health care provider or a lactation consultant. Overall health care recommendations while breastfeeding  Eat healthy foods. Alternate between meals and snacks, eating 3 of each per  day. Because what you eat affects your breast milk, some of the foods may make your baby more irritable than usual. Avoid eating these foods if you are sure that they are negatively affecting your baby.  Drink milk, fruit juice, and water to satisfy your thirst (about 10 glasses a day).  Rest often, relax, and continue to take your prenatal vitamins to prevent fatigue, stress, and anemia.  Continue breast self-awareness checks.  Avoid chewing and smoking tobacco. Chemicals from cigarettes that pass into breast milk and exposure to secondhand smoke may harm your baby.  Avoid alcohol and drug use, including marijuana. Some medicines that may be harmful to your baby can pass through breast milk. It is important to ask your health care   provider before taking any medicine, including all over-the-counter and prescription medicine as well as vitamin and herbal supplements. It is possible to become pregnant while breastfeeding. If birth control is desired, ask your health care provider about options that will be safe for your baby. Contact a health care provider if:  You feel like you want to stop breastfeeding or have become frustrated with breastfeeding.  You have painful breasts or nipples.  Your nipples are cracked or bleeding.  Your breasts are red, tender, or warm.  You have a swollen area on either breast.  You have a fever or chills.  You have nausea or vomiting.  You have drainage other than breast milk from your nipples.  Your breasts do not become full before feedings by the fifth day after you give birth.  You feel sad and depressed.  Your baby is too sleepy to eat well.  Your baby is having trouble sleeping.  Your baby is wetting less than 3 diapers in a 24-hour period.  Your baby has less than 3 stools in a 24-hour period.  Your baby's skin or the white part of his or her eyes becomes yellow.  Your baby is not gaining weight by 5 days of age. Get help right away  if:  Your baby is overly tired (lethargic) and does not want to wake up and feed.  Your baby develops an unexplained fever. This information is not intended to replace advice given to you by your health care provider. Make sure you discuss any questions you have with your health care provider. Document Released: 07/30/2005 Document Revised: 01/11/2016 Document Reviewed: 01/21/2013 Elsevier Interactive Patient Education  2017 Elsevier Inc.  

## 2017-04-23 NOTE — Progress Notes (Signed)
Patient wants to be induced.

## 2017-04-24 ENCOUNTER — Telehealth (HOSPITAL_COMMUNITY): Payer: Self-pay | Admitting: *Deleted

## 2017-04-24 ENCOUNTER — Telehealth: Payer: Self-pay | Admitting: *Deleted

## 2017-04-24 NOTE — Telephone Encounter (Signed)
Pt called to office with questions about induction.  Return call to pt. Induction process explained. Pt encouraged to ask staff questions if she is unsure of process.

## 2017-04-24 NOTE — Telephone Encounter (Signed)
Preadmission screen  

## 2017-04-26 ENCOUNTER — Other Ambulatory Visit: Payer: Self-pay | Admitting: Advanced Practice Midwife

## 2017-04-29 ENCOUNTER — Inpatient Hospital Stay (HOSPITAL_COMMUNITY): Payer: 59 | Admitting: Anesthesiology

## 2017-04-29 ENCOUNTER — Encounter (HOSPITAL_COMMUNITY): Payer: Self-pay

## 2017-04-29 ENCOUNTER — Inpatient Hospital Stay (HOSPITAL_COMMUNITY)
Admission: RE | Admit: 2017-04-29 | Discharge: 2017-05-02 | DRG: 775 | Disposition: A | Discharge status: Home / Self Care | Payer: 59 | Source: Ambulatory Visit | Attending: Family Medicine | Admitting: Family Medicine

## 2017-04-29 DIAGNOSIS — O09899 Supervision of other high risk pregnancies, unspecified trimester: Secondary | ICD-10-CM

## 2017-04-29 DIAGNOSIS — Z880 Allergy status to penicillin: Secondary | ICD-10-CM | POA: Diagnosis not present

## 2017-04-29 DIAGNOSIS — J45901 Unspecified asthma with (acute) exacerbation: Secondary | ICD-10-CM | POA: Diagnosis present

## 2017-04-29 DIAGNOSIS — O99214 Obesity complicating childbirth: Secondary | ICD-10-CM | POA: Diagnosis present

## 2017-04-29 DIAGNOSIS — Z3A39 39 weeks gestation of pregnancy: Secondary | ICD-10-CM

## 2017-04-29 DIAGNOSIS — O09529 Supervision of elderly multigravida, unspecified trimester: Secondary | ICD-10-CM

## 2017-04-29 DIAGNOSIS — O283 Abnormal ultrasonic finding on antenatal screening of mother: Secondary | ICD-10-CM

## 2017-04-29 DIAGNOSIS — O3663X0 Maternal care for excessive fetal growth, third trimester, not applicable or unspecified: Secondary | ICD-10-CM | POA: Diagnosis present

## 2017-04-29 DIAGNOSIS — O326XX0 Maternal care for compound presentation, not applicable or unspecified: Secondary | ICD-10-CM | POA: Diagnosis present

## 2017-04-29 DIAGNOSIS — O9952 Diseases of the respiratory system complicating childbirth: Secondary | ICD-10-CM | POA: Diagnosis present

## 2017-04-29 DIAGNOSIS — O24410 Gestational diabetes mellitus in pregnancy, diet controlled: Secondary | ICD-10-CM | POA: Diagnosis present

## 2017-04-29 DIAGNOSIS — Z6841 Body Mass Index (BMI) 40.0 and over, adult: Secondary | ICD-10-CM | POA: Diagnosis not present

## 2017-04-29 DIAGNOSIS — O24420 Gestational diabetes mellitus in childbirth, diet controlled: Principal | ICD-10-CM | POA: Diagnosis present

## 2017-04-29 DIAGNOSIS — O24419 Gestational diabetes mellitus in pregnancy, unspecified control: Secondary | ICD-10-CM | POA: Diagnosis present

## 2017-04-29 HISTORY — DX: Gastro-esophageal reflux disease without esophagitis: K21.9

## 2017-04-29 HISTORY — DX: Gestational diabetes mellitus in pregnancy, unspecified control: O24.419

## 2017-04-29 HISTORY — DX: Type 2 diabetes mellitus without complications: E11.9

## 2017-04-29 LAB — CBC
HCT: 38.8 % (ref 36.0–46.0)
HEMATOCRIT: 38.9 % (ref 36.0–46.0)
HEMOGLOBIN: 12.8 g/dL (ref 12.0–15.0)
HEMOGLOBIN: 12.7 g/dL (ref 12.0–15.0)
MCH: 22.1 pg — AB (ref 26.0–34.0)
MCH: 22.2 pg — ABNORMAL LOW (ref 26.0–34.0)
MCHC: 32.9 g/dL (ref 30.0–36.0)
MCHC: 32.7 g/dL (ref 30.0–36.0)
MCV: 67.3 fL — ABNORMAL LOW (ref 78.0–100.0)
MCV: 67.8 fL — ABNORMAL LOW (ref 78.0–100.0)
Platelets: 187 10*3/uL (ref 150–400)
Platelets: 178 10*3/uL (ref 150–400)
RBC: 5.78 MIL/uL — ABNORMAL HIGH (ref 3.87–5.11)
RBC: 5.72 MIL/uL — AB (ref 3.87–5.11)
RDW: 15.3 % (ref 11.5–15.5)
RDW: 15.1 % (ref 11.5–15.5)
WBC: 5.8 10*3/uL (ref 4.0–10.5)
WBC: 7.2 10*3/uL (ref 4.0–10.5)

## 2017-04-29 LAB — GLUCOSE, CAPILLARY
GLUCOSE-CAPILLARY: 88 mg/dL (ref 65–99)
GLUCOSE-CAPILLARY: 94 mg/dL (ref 65–99)
Glucose-Capillary: 120 mg/dL — ABNORMAL HIGH (ref 65–99)
Glucose-Capillary: 72 mg/dL (ref 65–99)

## 2017-04-29 LAB — PROTEIN / CREATININE RATIO, URINE
CREATININE, URINE: 66 mg/dL
Protein Creatinine Ratio: 0.3 mg/mg{Cre} — ABNORMAL HIGH (ref 0.00–0.15)
TOTAL PROTEIN, URINE: 20 mg/dL

## 2017-04-29 LAB — COMPREHENSIVE METABOLIC PANEL
ALK PHOS: 100 U/L (ref 38–126)
ALT: 13 U/L — AB (ref 14–54)
AST: 17 U/L (ref 15–41)
Albumin: 3.1 g/dL — ABNORMAL LOW (ref 3.5–5.0)
Anion gap: 9 (ref 5–15)
BUN: 8 mg/dL (ref 6–20)
CO2: 20 mmol/L — AB (ref 22–32)
CREATININE: 0.64 mg/dL (ref 0.44–1.00)
Calcium: 8.8 mg/dL — ABNORMAL LOW (ref 8.9–10.3)
Chloride: 106 mmol/L (ref 101–111)
GFR calc non Af Amer: >60 mL/min (ref >60)
GLUCOSE: 85 mg/dL (ref 65–99)
Potassium: 3.8 mmol/L (ref 3.5–5.1)
SODIUM: 135 mmol/L (ref 135–145)
Total Bilirubin: 0.5 mg/dL (ref 0.3–1.2)
Total Protein: 6.8 g/dL (ref 6.5–8.1)

## 2017-04-29 LAB — TYPE AND SCREEN
ABO/RH(D): A POS
Antibody Screen: NEGATIVE

## 2017-04-29 LAB — RPR: RPR Ser Ql: NONREACTIVE

## 2017-04-29 LAB — ABO/RH: ABO/RH(D): A POS

## 2017-04-29 MED ORDER — LIDOCAINE HCL (PF) 1 % IJ SOLN
30.0000 mL | INTRAMUSCULAR | Status: AC | PRN
  Administered 2017-04-30: 30 mL via SUBCUTANEOUS
  Filled 2017-04-29: qty 30

## 2017-04-29 MED ORDER — ACETAMINOPHEN 325 MG PO TABS: 650.0000 mg | ORAL_TABLET | ORAL | Status: DC | PRN

## 2017-04-29 MED ORDER — EPHEDRINE 5 MG/ML INJ: 10.0000 mg | INTRAVENOUS | Status: DC | PRN

## 2017-04-29 MED ORDER — LACTATED RINGERS IV SOLN
500.0000 mL | Freq: Once | INTRAVENOUS | Status: AC
  Administered 2017-04-29: 500 mL via INTRAVENOUS

## 2017-04-29 MED ORDER — LABETALOL HCL 5 MG/ML IV SOLN
INTRAVENOUS | Status: AC
  Filled 2017-04-29: qty 4

## 2017-04-29 MED ORDER — LACTATED RINGERS IV SOLN
500.0000 mL | INTRAVENOUS | Status: DC | PRN
  Administered 2017-04-29: 250 mL via INTRAVENOUS

## 2017-04-29 MED ORDER — OXYTOCIN BOLUS FROM INFUSION
500.0000 mL | Freq: Once | INTRAVENOUS | Status: AC
  Administered 2017-04-30: 500 mL via INTRAVENOUS

## 2017-04-29 MED ORDER — LABETALOL HCL 5 MG/ML IV SOLN
20.0000 mg | INTRAVENOUS | Status: DC | PRN
  Administered 2017-04-29: 20 mg via INTRAVENOUS

## 2017-04-29 MED ORDER — PHENYLEPHRINE 40 MCG/ML (10ML) SYRINGE FOR IV PUSH (FOR BLOOD PRESSURE SUPPORT): 80.0000 ug | PREFILLED_SYRINGE | INTRAVENOUS | Status: DC | PRN

## 2017-04-29 MED ORDER — HYDRALAZINE HCL 20 MG/ML IJ SOLN: 10.0000 mg | Freq: Once | INTRAMUSCULAR | Status: DC | PRN

## 2017-04-29 MED ORDER — OXYCODONE-ACETAMINOPHEN 5-325 MG PO TABS: 2.0000 | ORAL_TABLET | ORAL | Status: DC | PRN

## 2017-04-29 MED ORDER — OXYTOCIN 40 UNITS IN LACTATED RINGERS INFUSION - SIMPLE MED
1.0000 m[IU]/min | INTRAVENOUS | Status: DC
  Administered 2017-04-29: 2 m[IU]/min via INTRAVENOUS

## 2017-04-29 MED ORDER — FENTANYL 2.5 MCG/ML BUPIVACAINE 1/10 % EPIDURAL INFUSION (WH - ANES)
14.0000 mL/h | INTRAMUSCULAR | Status: DC | PRN
  Administered 2017-04-29: 14 mL/h via EPIDURAL
  Filled 2017-04-29: qty 100

## 2017-04-29 MED ORDER — TERBUTALINE SULFATE 1 MG/ML IJ SOLN: 0.2500 mg | Freq: Once | INTRAMUSCULAR | Status: DC | PRN

## 2017-04-29 MED ORDER — SOD CITRATE-CITRIC ACID 500-334 MG/5ML PO SOLN: 30.0000 mL | ORAL | Status: DC | PRN

## 2017-04-29 MED ORDER — FENTANYL CITRATE (PF) 100 MCG/2ML IJ SOLN
100.0000 ug | INTRAMUSCULAR | Status: DC | PRN
  Administered 2017-04-29 (×2): 100 ug via INTRAVENOUS
  Filled 2017-04-29 (×2): qty 2

## 2017-04-29 MED ORDER — OXYTOCIN 40 UNITS IN LACTATED RINGERS INFUSION - SIMPLE MED
2.5000 [IU]/h | INTRAVENOUS | Status: DC
  Filled 2017-04-29: qty 1000

## 2017-04-29 MED ORDER — OXYCODONE-ACETAMINOPHEN 5-325 MG PO TABS: 1.0000 | ORAL_TABLET | ORAL | Status: DC | PRN

## 2017-04-29 MED ORDER — MISOPROSTOL 25 MCG QUARTER TABLET
25.0000 ug | ORAL_TABLET | ORAL | Status: DC | PRN
Start: 2017-04-29 — End: 2017-04-30
  Administered 2017-04-29 (×2): 25 ug via VAGINAL
  Filled 2017-04-29 (×2): qty 1

## 2017-04-29 MED ORDER — ONDANSETRON HCL 4 MG/2ML IJ SOLN: 4.0000 mg | Freq: Four times a day (QID) | INTRAMUSCULAR | Status: DC | PRN

## 2017-04-29 MED ORDER — PHENYLEPHRINE 40 MCG/ML (10ML) SYRINGE FOR IV PUSH (FOR BLOOD PRESSURE SUPPORT)
80.0000 ug | PREFILLED_SYRINGE | INTRAVENOUS | Status: DC | PRN
  Filled 2017-04-29: qty 10

## 2017-04-29 MED ORDER — DIPHENHYDRAMINE HCL 50 MG/ML IJ SOLN: 12.5000 mg | INTRAMUSCULAR | Status: DC | PRN

## 2017-04-29 MED ORDER — LIDOCAINE HCL (PF) 1 % IJ SOLN
INTRAMUSCULAR | Status: DC | PRN
  Administered 2017-04-29: 5 mL via EPIDURAL
  Administered 2017-04-29: 4 mL via EPIDURAL

## 2017-04-29 MED ORDER — LACTATED RINGERS IV SOLN
INTRAVENOUS | Status: DC
  Administered 2017-04-29 (×2): via INTRAVENOUS

## 2017-04-29 NOTE — H&P (Signed)
OBSTETRIC ADMISSION HISTORY AND PHYSICAL  Cheryl Acosta is a 41 y.o. female [phone] with IUP at [redacted]w[redacted]d by LMP c/w 12 week U/S presenting for IOL secondary to GDMA1 poorly controlled. Theis pregnancy also complicated by advanced maternal age and increased fetal nuchal translucency with low risk NIPS. She reports +FMs, No LOF, no VB, no blurry vision, headaches or peripheral edema, and RUQ pain.  She plans on breast feeding. She is undecided for birth control. She received her prenatal care at [facility].  Dating: By LMP --->  Estimated Date of Delivery: 05/06/17  Sono:   @[redacted]w[redacted]d , CWD, normal anatomy, cephalic presentation, 8938B, >90% EFW, AC > 97%   Prenatal History/Complications: Clinic [facility] Prenatal Labs  Dating LMP c/w 12 weeks sono Blood type: A/Positive/-- (02/26 1635)   Genetic Screen 1 Screen:thickened NT    NIPS:low risk Antibody:Negative (02/26 1635)  Anatomic [place] 12-04-16 Rubella: 9.65 (02/26 1635)  GTT Early:               Third trimester:  RPR: Non Reactive (07/09 1042)   Flu vaccine  deferred until after delivery HBsAg: Negative (02/26 1635)   TDaP vaccine 04/03/17                                   Rhogam: HIV:   NR  Baby Food   Breast                                            GBS: (For PCN allergy, check sensitivities)  Contraception  Undecided OFB:PZWCHENI 10/08/2016  Circumcision  Yes    Pediatrician   [facility]   Support Person   Husband-Taiwo     Past Medical History: Past Medical History:  Diagnosis Date  . Anemia   . Asthma   . Diabetes mellitus without complication ([place])   . Environmental allergies     Past Surgical History: Past Surgical History:  Procedure Laterality Date  . BREAST SURGERY  2003   BILATERAL LUMPECTOMY   . DILATION AND EVACUATION N/A 12/12/2015   Procedure: DILATATION AND EVACUATION;  Surgeon: Shelly Bombard, MD;  Location: [facility];  Service: Gynecology;  Laterality: N/A;  . THUMB FUSION      Obstetrical History: OB  History    Gravida Para Term Preterm AB Living   4 2 2  0 1 2   SAB TAB Ectopic Multiple Live Births   0 1 0 0 2      Social History: Social History   Social History  . Marital status: Married    Spouse name: N/A  . Number of children: N/A  . Years of education: N/A   Social History Main Topics  . Smoking status: Never Smoker  . Smokeless tobacco: Never Used  . Alcohol use No  . Drug use: No  . Sexual activity: Yes    Birth control/ protection: None   Other Topics Concern  . None   Social History Narrative  . None    Family History: Family History  Problem Relation Age of Onset  . Diabetes Mother     Allergies: No Known Allergies  Prescriptions Prior to Admission  Medication Sig Dispense Refill Last Dose  . ACCU-CHEK FASTCLIX LANCETS MISC 1 Device by Does not apply route 4 (four) times  daily. 1 each 12 Taking  . albuterol (PROVENTIL HFA;VENTOLIN HFA) 108 (90 Base) MCG/ACT inhaler Inhale 2 puffs into the lungs every 6 (six) hours as needed for wheezing or shortness of breath. 1 Inhaler 5 Taking  . cetirizine (ZYRTEC) 10 MG tablet Take 10 mg by mouth daily.   Taking  . glucose blood (ACCU-CHEK GUIDE) test strip Use as directed 4 times a day 100 each 12 Taking  . glucose blood test strip Check fasting am and 2 hours after meals 400 each 12 Taking  . omeprazole (PRILOSEC) 20 MG capsule Take 1 capsule (20 mg total) by mouth 2 (two) times daily before a meal. 60 capsule 5 Taking  . Prenatal Vit-Iron Carbonyl-FA (VOL-TAB RX) 29-1 MG TABS TAKE ONE TABLET BY MOUTH ONCE DAILY 30 tablet 11 Taking     Review of Systems  All systems reviewed and negative except as stated in HPI  Physical Exam Blood pressure (!) 117/51, pulse 79, temperature 98.1 F (36.7 C), temperature source Oral, resp. rate 20, last menstrual period 07/30/2016, unknown if currently breastfeeding. General appearance: alert and cooperative Lungs: clear to auscultation bilaterally Heart: regular rate  and rhythm Abdomen: soft, non-tender; bowel sounds normal Pelvic: deferred Extremities: Homans sign is negative, no sign of DVT Presentation: cephalic Fetal monitoringBaseline: 140 bpm, Variability: Good {> 6 bpm), Accelerations: Reactive and Decelerations: Variable: mild Uterine activity: irregular Dilation: 1.5 Effacement (%): 50 Station: -3 Exam by:: Curly [place], RN  Prenatal labs: ABO, Rh: A/Positive/-- (02/26 1635) Antibody: Negative (02/26 1635) Rubella: 9.65 (02/26 1635) RPR: Non Reactive (07/09 1042)  HBsAg: Negative (02/26 1635)  HIV:   Nonreactive  GBS: Negative (08/22 1657)  2 hr Glucola abnormal Genetic screening  Increased NT, normal NIPS Anatomy US WNL female  Prenatal Transfer Tool  Maternal Diabetes: Yes:  Diabetes Type:  Diet controlled Genetic Screening: Abnormal:  Results: Other: Maternal Ultrasounds/Referrals: Normal Fetal Ultrasounds or other Referrals:  Fetal echo WNL Maternal Substance Abuse:  No Significant Maternal Medications:  None Significant Maternal Lab Results: None  Results for orders placed or performed during the hospital encounter of 04/29/17 (from the past 24 hour(s))  CBC   Collection Time: 04/29/17  8:44 AM  Result Value Ref Range   WBC 5.8 4.0 - 10.5 K/uL   RBC 5.78 (H) 3.87 - 5.11 MIL/uL   Hemoglobin 12.8 12.0 - 15.0 g/dL   HCT 38.9 36.0 - 46.0 %   MCV 67.3 (L) 78.0 - 100.0 fL   MCH 22.1 (L) 26.0 - 34.0 pg   MCHC 32.9 30.0 - 36.0 g/dL   RDW 15.3 11.5 - 15.5 %   Platelets 187 150 - 400 K/uL  Glucose, capillary   Collection Time: 04/29/17  8:48 AM  Result Value Ref Range   Glucose-Capillary 88 65 - 99 mg/dL   Comment 1 Document in Chart    Comment 2 Call MD NNP PA CNM     Patient Active Problem List   Diagnosis Date Noted  . Gestational diabetes, diet controlled 04/29/2017  . Gestational diabetes 04/23/2017  . Asthma exacerbation 02/09/2017  . Hyperglycemia 02/09/2017  . Supervision of other high risk pregnancy,  antepartum 11/05/2016  . Increased nuchal translucency space on fetal ultrasound 10/29/2016  . AMA (advanced maternal age) multigravida 28+, unspecified trimester 10/08/2016    Assessment/Plan:  Cheryl Acosta is a 41 y.o. [id number] at [redacted]w[redacted]d here for IOL for GDMA1 that is poorly controlled.  #Labor: Cytotec placed. Will place FB as well for cervical ripening #GDMA1: CBG  88 (WNL), monitor CBG q4h intrapartum #Pain: Epidural if desired #FWB:  Category 1 #ID:  GBS negative #MOF: Breast #MOC: Undecided #Circ:  inaptient  Metta Clines, DO PGY-2 Family Medicine Resident 04/29/2017, 9:25 AM   OB FELLOW HISTORY AND PHYSICAL ATTESTATION  I have seen and examined this patient; I agree with above documentation in the resident's note.  --IOL for uncontrolled A1GDM. Initial CBG 88. Continue to monitor q4h, q2h when in active labor.   Gailen Shelter, MD OB Fellow 04/29/2017, 10:10 AM

## 2017-04-29 NOTE — Progress Notes (Signed)
Foley bulb out 

## 2017-04-29 NOTE — Progress Notes (Signed)
Cheryl Acosta is a 41 y.o. [id number] at [redacted]w[redacted]d by LMP admitted for induction of labor due to Gestational diabetes.  Subjective: Doing well.  No concerns.  FOB at bedside.  She is agreeable to foley bulb at this time.  Objective: BP (!) 104/50   Pulse 74   Temp 97.8 F (36.6 C) (Oral)   Resp 18   Ht 5\' 8"  (1.727 m)   Wt (!) 144.7 kg (319 lb)   LMP 07/30/2016   BMI 48.50 kg/m  No intake/output data recorded. No intake/output data recorded.  FHT:  FHR: 140 bpm, variability: moderate,  accelerations:  Present,  decelerations:  Absent UC:   regular, every 2-4 minutes SVE:   Dilation: 2.5 Effacement (%): 50, 60 Station: -2 Exam by:: Dr. Lambert Keto  Labs: Lab Results  Component Value Date   WBC 5.8 04/29/2017   HGB 12.8 04/29/2017   HCT 38.9 04/29/2017   MCV 67.3 (L) 04/29/2017   PLT 187 04/29/2017    Assessment / Plan: IOL for poorly controlled GDMA1  Labor: Second cytotec placed, FB placed successfully Preeclampsia:  N/A Fetal Wellbeing:  Category I Pain Control:  Support for now I/D:  n/a Anticipated MOD:  NSVD  Metta Clines DO PGY-2 Family Medicine Resident 04/29/2017, 1:51 PM

## 2017-04-29 NOTE — Progress Notes (Signed)
Labor Progress Note  Cheryl Acosta is a 41 y.o. T9Q3009 at [redacted]w[redacted]d presented for IOL for GDMA1 and LGA.  S: Comfortable at bedside with supportive partner. About to have epidural.  O:  BP 135/68   Pulse 78   Temp 98.2 F (36.8 C) (Oral)   Resp (P) 16   Ht 5\' 8"  (1.727 m)   Wt (!) 144.7 kg (319 lb)   LMP 07/30/2016   SpO2 100%   BMI 48.50 kg/m   1835: CVE: Dilation: 4.5 Effacement (%): 50 Cervical Position: Middle Station: -2 Presentation: Vertex Exam by:: sowder FB out, pit running  135, moderate, +accels, some variables, irregular  A&P: 41 y.o. [id number] [redacted]w[redacted]d IOL. #Labor: Progressing well. Continue pitocin. #GDMA1: glucoses at goal intrapartum. #LGA: 37w [place] with EFW 3757g, >97th%, HC:AC 0.89 - shoulder precautions at delivery. #Pain: about to get epidural #FWB: cat 1 - overall reassuring #GBS negative   Seen with Julianne Handler, CNM.   Lockie Pares, MD 9:59 PM

## 2017-04-29 NOTE — Anesthesia Pain Management Evaluation Note (Signed)
  CRNA Pain Management Visit Note  Patient: Cheryl Acosta, 41 y.o., female  "Hello I am a member of the anesthesia team at [facility]. We have an anesthesia team available at all times to provide care throughout the hospital, including epidural management and anesthesia for C-section. I don't know your plan for the delivery whether it a natural birth, water birth, IV sedation, nitrous supplementation, doula or epidural, but we want to meet your pain goals."   1.Was your pain managed to your expectations on prior hospitalizations?   Yes   2.What is your expectation for pain management during this hospitalization?     Epidural  3.How can we help you reach that goal? epidural  Record the patient's initial score and the patient's pain goal.   Pain: 0  Pain Goal: 4 [facility] wants you to be able to say your pain was always managed very well.  Cheryl Acosta 04/29/2017

## 2017-04-29 NOTE — Anesthesia Procedure Notes (Signed)
Epidural Patient location during procedure: OB Start time: 04/29/2017 9:33 PM  Staffing Anesthesiologist: Josephine Igo Performed: anesthesiologist   Preanesthetic Checklist Completed: patient identified, site marked, surgical consent, pre-op evaluation, timeout performed, IV checked, risks and benefits discussed and monitors and equipment checked  Epidural Patient position: sitting Prep: site prepped and draped and DuraPrep Patient monitoring: continuous pulse ox and blood pressure Approach: midline Location: L3-L4 Injection technique: LOR air  Needle:  Needle type: Tuohy  Needle gauge: 17 G Needle length: 9 cm and 9 Needle insertion depth: 7 cm Catheter type: closed end flexible Catheter size: 19 Gauge Catheter at skin depth: 13 cm Test dose: negative and Other  Assessment Events: blood not aspirated, injection not painful, no injection resistance, negative IV test and no paresthesia  Additional Notes Patient identified. Risks and benefits discussed including failed block, incomplete  Pain control, post dural puncture headache, nerve damage, paralysis, blood pressure Changes, nausea, vomiting, reactions to medications-both toxic and allergic and post Partum back pain. All questions were answered. Patient expressed understanding and wished to proceed. Sterile technique was used throughout procedure. Epidural site was Dressed with sterile barrier dressing. No paresthesias, signs of intravascular injection Or signs of intrathecal spread were encountered.  Patient was more comfortable after the epidural was dosed. Please see RN's note for documentation of vital signs and FHR which are stable.

## 2017-04-29 NOTE — Anesthesia Preprocedure Evaluation (Addendum)
Anesthesia Evaluation  Patient identified by MRN, date of birth, ID band Patient awake    Reviewed: Allergy & Precautions, Patient's Chart, lab work & pertinent test results  Airway Mallampati: III  TM Distance: >3 FB Neck ROM: Full    Dental no notable dental hx. (+) Teeth Intact Orthodontic appliances:   Pulmonary asthma ,  Reactive airways   Pulmonary exam normal breath sounds clear to auscultation       Cardiovascular negative cardio ROS Normal cardiovascular exam Rhythm:Regular Rate:Normal     Neuro/Psych negative neurological ROS  negative psych ROS   GI/Hepatic negative GI ROS, Neg liver ROS, GERD  Medicated and Controlled,  Endo/Other  diabetes, Well Controlled, GestationalMorbid obesity  Renal/GU negative Renal ROS  negative genitourinary   Musculoskeletal negative musculoskeletal ROS (+)   Abdominal (+) + obese,   Peds  Hematology  (+) anemia ,   Anesthesia Other Findings   Reproductive/Obstetrics (+) Pregnancy AMA                           Anesthesia Physical  Anesthesia Plan  ASA: III  Anesthesia Plan: Epidural   Post-op Pain Management:    Induction:   PONV Risk Score and Plan:   Airway Management Planned: Natural Airway  Additional Equipment:   Intra-op Plan:   Post-operative Plan:   Informed Consent: I have reviewed the patients History and Physical, chart, labs and discussed the procedure including the risks, benefits and alternatives for the proposed anesthesia with the patient or authorized representative who has indicated his/her understanding and acceptance.   Dental advisory given  Plan Discussed with: Anesthesiologist  Anesthesia Plan Comments:         Anesthesia Quick Evaluation

## 2017-04-29 NOTE — Progress Notes (Signed)
[place] @ 3220 with light mec.  Patient shaking. Comfortable after epidural. Was having variables, but resolved with position changes. Exam unchanged - 4.5/70.-2, but fore bag present, did not rupture due to high station.  Will increase pitocin.  Donnita Falls, MD Family Medicine Resident, PGY-3 04/29/2017 11:40 PM

## 2017-04-30 ENCOUNTER — Encounter: Payer: Self-pay | Admitting: Obstetrics & Gynecology

## 2017-04-30 ENCOUNTER — Encounter (HOSPITAL_COMMUNITY): Payer: Self-pay

## 2017-04-30 DIAGNOSIS — Z3A39 39 weeks gestation of pregnancy: Secondary | ICD-10-CM

## 2017-04-30 DIAGNOSIS — O24420 Gestational diabetes mellitus in childbirth, diet controlled: Secondary | ICD-10-CM

## 2017-04-30 LAB — CBC
HEMATOCRIT: 36.4 % (ref 36.0–46.0)
HEMATOCRIT: 37.6 % (ref 36.0–46.0)
HEMOGLOBIN: 12.3 g/dL (ref 12.0–15.0)
Hemoglobin: 12.5 g/dL (ref 12.0–15.0)
MCH: 22.4 pg — ABNORMAL LOW (ref 26.0–34.0)
MCH: 22.3 pg — ABNORMAL LOW (ref 26.0–34.0)
MCHC: 33.8 g/dL (ref 30.0–36.0)
MCHC: 33.2 g/dL (ref 30.0–36.0)
MCV: 66.3 fL — ABNORMAL LOW (ref 78.0–100.0)
MCV: 67.1 fL — AB (ref 78.0–100.0)
Platelets: 157 10*3/uL (ref 150–400)
Platelets: 149 10*3/uL — ABNORMAL LOW (ref 150–400)
RBC: 5.49 MIL/uL — AB (ref 3.87–5.11)
RBC: 5.6 MIL/uL — AB (ref 3.87–5.11)
RDW: 15 % (ref 11.5–15.5)
RDW: 15 % (ref 11.5–15.5)
WBC: 9.9 10*3/uL (ref 4.0–10.5)
WBC: 12.2 10*3/uL — AB (ref 4.0–10.5)

## 2017-04-30 LAB — GLUCOSE, CAPILLARY: Glucose-Capillary: 94 mg/dL (ref 65–99)

## 2017-04-30 MED ORDER — ACETAMINOPHEN 325 MG PO TABS
650.0000 mg | ORAL_TABLET | ORAL | Status: DC | PRN
  Administered 2017-04-30 (×2): 650 mg via ORAL
  Filled 2017-04-30 (×2): qty 2

## 2017-04-30 MED ORDER — PRENATAL MULTIVITAMIN CH
1.0000 | ORAL_TABLET | Freq: Every day | ORAL | Status: DC
  Administered 2017-04-30 – 2017-05-02 (×3): 1 via ORAL
  Filled 2017-04-30 (×3): qty 1

## 2017-04-30 MED ORDER — SIMETHICONE 80 MG PO CHEW: 80.0000 mg | CHEWABLE_TABLET | ORAL | Status: DC | PRN

## 2017-04-30 MED ORDER — TETANUS-DIPHTH-ACELL PERTUSSIS 5-2.5-18.5 LF-MCG/0.5 IM SUSP
0.5000 mL | Freq: Once | INTRAMUSCULAR | Status: DC
Start: 2017-05-01 — End: 2017-05-02

## 2017-04-30 MED ORDER — ONDANSETRON HCL 4 MG PO TABS: 4.0000 mg | ORAL_TABLET | ORAL | Status: DC | PRN

## 2017-04-30 MED ORDER — PANTOPRAZOLE SODIUM 40 MG PO TBEC
40.0000 mg | DELAYED_RELEASE_TABLET | Freq: Every day | ORAL | Status: DC
Start: 2017-04-30 — End: 2017-05-02
  Administered 2017-04-30 – 2017-05-02 (×3): 40 mg via ORAL
  Filled 2017-04-30 (×3): qty 1

## 2017-04-30 MED ORDER — DIPHENHYDRAMINE HCL 25 MG PO CAPS: 25.0000 mg | ORAL_CAPSULE | Freq: Four times a day (QID) | ORAL | Status: DC | PRN

## 2017-04-30 MED ORDER — LORATADINE 10 MG PO TABS
10.0000 mg | ORAL_TABLET | Freq: Every day | ORAL | Status: DC
Start: 2017-04-30 — End: 2017-05-02
  Administered 2017-04-30 – 2017-05-02 (×3): 10 mg via ORAL
  Filled 2017-04-30 (×3): qty 1

## 2017-04-30 MED ORDER — DIBUCAINE 1 % RE OINT: 1.0000 "application " | TOPICAL_OINTMENT | RECTAL | Status: DC | PRN

## 2017-04-30 MED ORDER — BENZOCAINE-MENTHOL 20-0.5 % EX AERO
1.0000 "application " | INHALATION_SPRAY | CUTANEOUS | Status: DC | PRN
  Administered 2017-04-30 – 2017-05-01 (×2): 1 via TOPICAL
  Filled 2017-04-30 (×2): qty 56

## 2017-04-30 MED ORDER — COCONUT OIL OIL: 1.0000 "application " | TOPICAL_OIL | Status: DC | PRN

## 2017-04-30 MED ORDER — SENNOSIDES-DOCUSATE SODIUM 8.6-50 MG PO TABS
2.0000 | ORAL_TABLET | ORAL | Status: DC
  Administered 2017-05-01 (×2): 2 via ORAL
  Filled 2017-04-30 (×2): qty 2

## 2017-04-30 MED ORDER — ONDANSETRON HCL 4 MG/2ML IJ SOLN: 4.0000 mg | INTRAMUSCULAR | Status: DC | PRN

## 2017-04-30 MED ORDER — IBUPROFEN 600 MG PO TABS
600.0000 mg | ORAL_TABLET | Freq: Four times a day (QID) | ORAL | Status: DC
  Administered 2017-04-30 – 2017-05-02 (×10): 600 mg via ORAL
  Filled 2017-04-30 (×10): qty 1

## 2017-04-30 MED ORDER — ZOLPIDEM TARTRATE 5 MG PO TABS: 5.0000 mg | ORAL_TABLET | Freq: Every evening | ORAL | Status: DC | PRN

## 2017-04-30 MED ORDER — MOMETASONE FURO-FORMOTEROL FUM 200-5 MCG/ACT IN AERO
2.0000 | INHALATION_SPRAY | Freq: Two times a day (BID) | RESPIRATORY_TRACT | Status: DC
Start: 2017-04-30 — End: 2017-05-02
  Administered 2017-04-30 – 2017-05-01 (×4): 2 via RESPIRATORY_TRACT
  Filled 2017-04-30: qty 8.8

## 2017-04-30 MED ORDER — WITCH HAZEL-GLYCERIN EX PADS: 1.0000 "application " | MEDICATED_PAD | CUTANEOUS | Status: DC | PRN

## 2017-04-30 NOTE — Progress Notes (Signed)
Called to room with recurrent variables. SVE 7/90/-1 with forebag. AROM'd forebag and attempted IUPC, but unable because head became very well applied. Pit has been stopped for now. Anticipate NSVD soon. Donnita Falls, MD Family Medicine Resident, PGY-3 04/30/2017 12:28 AM

## 2017-04-30 NOTE — Anesthesia Postprocedure Evaluation (Signed)
Anesthesia Post Note  Patient: Cheryl Acosta  Procedure(s) Performed: * No procedures listed *     Patient location during evaluation: Mother Baby Anesthesia Type: Epidural Level of consciousness: awake, awake and alert, oriented and patient cooperative Pain management: pain level controlled Vital Signs Assessment: post-procedure vital signs reviewed and stable Respiratory status: spontaneous breathing, nonlabored ventilation and respiratory function stable Cardiovascular status: stable Postop Assessment: no headache, no backache, no apparent nausea or vomiting and patient able to bend at knees Anesthetic complications: no    Last Vitals:  Vitals:   04/30/17 0300 04/30/17 0429  BP: (!) 100/51 (!) 104/59  Pulse: 68 70  Resp: 18 18  Temp: 36.7 C 36.6 C  SpO2:      Last Pain:  Vitals:   04/30/17 0429  TempSrc: Oral  PainSc:    Pain Goal: Patients Stated Pain Goal: 7 (04/29/17 1048)               Reis Goga L

## 2017-05-01 MED ORDER — IBUPROFEN 600 MG PO TABS: 600.0000 mg | ORAL_TABLET | Freq: Four times a day (QID) | ORAL | 0 refills | Status: DC | PRN

## 2017-05-01 NOTE — Progress Notes (Signed)
Patient desires circumcision for her female infant.  Circumcision procedure details discussed, risks and benefits of procedure were also discussed.  These include but are not limited to: Benefits of circumcision in men include reduction in the rates of urinary tract infection (UTI), penile cancer, some sexually transmitted infections, penile inflammatory and retractile disorders, as well as easier hygiene.  Risks include bleeding , infection, injury of glans which may lead to penile deformity or urinary tract issues, unsatisfactory cosmetic appearance and other potential complications related to the procedure.  It was emphasized that this is an elective procedure.  Patient wants to proceed with circumcision; written informed consent obtained.  Will do circumcision soon, routine circumcision and post circumcision care ordered for the infant.  Almyra Free P. Martine Bleecker, MD OB Fellow

## 2017-05-01 NOTE — Discharge Summary (Signed)
OB Discharge Summary     Patient Name: Cheryl Acosta DOB: 11/02/1975 MRN: [id number]  Date of admission: 04/29/2017 Delivering MD: Lockie Pares   Date of discharge: 05/01/2017  Admitting diagnosis: 39wks, induction Intrauterine pregnancy: [redacted]w[redacted]d     Secondary diagnosis:  Principal Problem:   SVD (spontaneous vaginal delivery) Active Problems:   AMA (advanced maternal age) multigravida 35+, unspecified trimester   Increased nuchal translucency space on fetal ultrasound   Supervision of other high risk pregnancy, antepartum   Gestational diabetes   Gestational diabetes, diet controlled  Additional problems:  Patient Active Problem List   Diagnosis Date Noted  . SVD (spontaneous vaginal delivery) 04/30/2017  . Gestational diabetes, diet controlled 04/29/2017  . Gestational diabetes 04/23/2017  . Asthma exacerbation 02/09/2017  . Hyperglycemia 02/09/2017  . Supervision of other high risk pregnancy, antepartum 11/05/2016  . Increased nuchal translucency space on fetal ultrasound 10/29/2016  . AMA (advanced maternal age) multigravida 89+, unspecified trimester 10/08/2016        Discharge diagnosis: Term Pregnancy Delivered                                                                                                Post partum procedures: N/A  Augmentation: Pitocin, Cytotec and Foley Balloon  Complications: None  Hospital course:  Induction of Labor With Vaginal Delivery   41 y.o. yo [phone] at [redacted]w[redacted]d was admitted to the hospital 04/29/2017 for induction of labor.  Indication for induction: A1 DM.  Patient had an uncomplicated labor course as follows: Membrane Rupture Time/Date: 10:39 PM ,04/29/2017   Intrapartum Procedures: Episiotomy: None [1]                                         Lacerations:  2nd degree [3];Perineal [11]  Patient had delivery of a Viable infant.  Information for the patient's newborn:  [place], [place] [[id number]]  Delivery Method: Vag-Spont    04/30/2017  Details of delivery can be found in separate delivery note.  Patient had a routine postpartum course. Patient is discharged home 05/01/17.  Physical exam  Vitals:   04/30/17 0429 04/30/17 0901 04/30/17 1830 05/01/17 0624  BP: (!) 104/59 120/70 129/74 139/76  Pulse: 70 72 67 61  Resp: 18 18 18 14   Temp: 97.9 F (36.6 C) 97.8 F (36.6 C) 97.9 F (36.6 C) 98.1 F (36.7 C)  TempSrc: Oral  Oral Oral  SpO2:    100%  Weight:      Height:       General: alert, cooperative and no distress Lochia: appropriate Uterine Fundus: firm Incision: N/A DVT Evaluation: No evidence of DVT seen on physical exam. Labs: Lab Results  Component Value Date   WBC 12.2 (H) 04/30/2017   HGB 12.5 04/30/2017   HCT 37.6 04/30/2017   MCV 67.1 (L) 04/30/2017   PLT 149 (L) 04/30/2017   CMP Latest Ref Rng & Units 04/29/2017  Glucose 65 - 99 mg/dL 85  BUN 6 - 20 mg/dL 8  Creatinine 0.44 - 1.00 mg/dL 0.64  Sodium 135 - 145 mmol/L 135  Potassium 3.5 - 5.1 mmol/L 3.8  Chloride 101 - 111 mmol/L 106  CO2 22 - 32 mmol/L 20(L)  Calcium 8.9 - 10.3 mg/dL 8.8(L)  Total Protein 6.5 - 8.1 g/dL 6.8  Total Bilirubin 0.3 - 1.2 mg/dL 0.5  Alkaline Phos 38 - 126 U/L 100  AST 15 - 41 U/L 17  ALT 14 - 54 U/L 13(L)    Discharge instruction: per After Visit Summary and "Baby and Me Booklet".  After visit meds:  Allergies as of 05/01/2017   No Known Allergies     Medication List    STOP taking these medications   ACCU-CHEK FASTCLIX LANCETS Misc   glucose blood test strip Commonly known as:  ACCU-CHEK GUIDE   SYMBICORT 160-4.5 MCG/ACT inhaler Generic drug:  budesonide-formoterol     TAKE these medications   cetirizine 10 MG tablet Commonly known as:  ZYRTEC Take 10 mg by mouth daily as needed for allergies.   ibuprofen 600 MG tablet Commonly known as:  ADVIL,MOTRIN Take 1 tablet (600 mg total) by mouth every 6 (six) hours as needed for moderate pain or cramping.   omeprazole 20 MG  capsule Commonly known as:  PRILOSEC Take 1 capsule (20 mg total) by mouth 2 (two) times daily before a meal. What changed:  when to take this  reasons to take this   VOL-TAB RX 29-1 MG Tabs TAKE ONE TABLET BY MOUTH ONCE DAILY            Discharge Care Instructions        Start     Ordered   05/01/17 0000  ibuprofen (ADVIL,MOTRIN) 600 MG tablet  Every 6 hours PRN     05/01/17 0942   04/29/17 0000  OB RESULTS CONSOLE GC/Chlamydia    Comments:  This external order was created through the Results Console.   04/29/17 1936      Diet: carb modified diet  Activity: Advance as tolerated. Pelvic rest for 6 weeks.   Outpatient follow up:6 weeks Follow up Appt:Future Appointments Date Time Provider [place]  05/28/2017 8:45 AM Shelly Bombard, MD [place] None   Follow up Visit:No Follow-up on file.  Postpartum contraception: Nuvaring  Newborn Data: Live born female  Birth Weight: 8 lb 1.3 oz (3666 g) APGAR: 7, 9  Baby Feeding: Breast Disposition:home with mother   05/01/2017 Metta Clines, DO PGY-2 Family Medicine Resident   Midwife attestation I have seen and examined this patient and agree with above documentation in the resident's note.   Cheryl Acosta is a 41 y.o. L4T6256 s/p SVD.   Pain is well controlled.  Plan for birth control is NuvaRing vaginal inserts.  Method of Feeding: breast  PE:  Gen: well appearing Heart: reg rate Lungs: normal WOB Fundus firm Ext: soft, no pain, no edema  No results for input(s): HGB, HCT in the last 72 hours.   Assessment - discharge today  Plan: - postpartum care discussed - f/u clinic in 6 weeks for postpartum visit and GTT   Julianne Handler, CNM 8:27 AM

## 2017-05-01 NOTE — Discharge Instructions (Signed)

## 2017-05-01 NOTE — Lactation Note (Signed)
This note was copied from a baby's chart. Lactation Consultation Note  Patient Name: Cheryl Acosta [id number] Date: 05/01/2017 Reason for consult: Initial assessment;Difficult latch;Other (Comment) (Bilateral lumpectomy.)  Baby 52 hours old. Baby just back from circumcision, mom preparing to give baby a bottle of formula. Mom states that she intends to feed this baby as she did first 2 with breast milk and formula. However, mom states that she is determined to give this baby more breast milk. Mom reports that she had bilateral lumpectomy prior to birth of first two children, and this did not impact her breast milk supply.  Mom states that this baby is latching better now that she is using NS. Enc mom to continue putting baby to breast first, then supplement, and then do some additional stimulation of breast since mom using NS. Discussed methods of moving away from NS use. Mom aware of OP/BFSG and [place] phone line assistance after D/C.    Maternal Data Has patient been taught Hand Expression?: Yes (per mom.) Does the patient have breastfeeding experience prior to this delivery?: Yes  Feeding Feeding Type: Formula  LATCH Score                   Interventions    Lactation Tools Discussed/Used     Consult Status Consult Status: PRN    Andres Labrum 05/01/2017, 12:07 PM

## 2017-05-01 NOTE — Progress Notes (Signed)
After further assessment, patient has decided that she would rather stay another night in the hospital for full 48 hour care.  We will defer discharge until tomorrow.

## 2017-05-02 ENCOUNTER — Encounter (HOSPITAL_COMMUNITY): Payer: Self-pay

## 2017-05-02 NOTE — Discharge Summary (Signed)
OB Discharge Summary                           Patient Name: Cheryl Acosta DOB: [birth year] MRN: [id number]  Date of admission: 04/29/2017 Delivering MD: Lockie Pares   Date of discharge: 05/02/2017  Admitting diagnosis: 39wks, induction Intrauterine pregnancy: [redacted]w[redacted]d     Secondary diagnosis:  Principal Problem:   SVD (spontaneous vaginal delivery) Active Problems:   AMA (advanced maternal age) multigravida 35+, unspecified trimester   Increased nuchal translucency space on fetal ultrasound   Supervision of other high risk pregnancy, antepartum   Gestational diabetes   Gestational diabetes, diet controlled  Additional problems:      Patient Active Problem List   Diagnosis Date Noted  . SVD (spontaneous vaginal delivery) 04/30/2017  . Gestational diabetes, diet controlled 04/29/2017  . Gestational diabetes 04/23/2017  . Asthma exacerbation 02/09/2017  . Hyperglycemia 02/09/2017  . Supervision of other high risk pregnancy, antepartum 11/05/2016  . Increased nuchal translucency space on fetal ultrasound 10/29/2016  . AMA (advanced maternal age) multigravida 23+, unspecified trimester 10/08/2016                                         Discharge diagnosis: Term Pregnancy Delivered                                                                                                Post partum procedures: N/A  Augmentation: Pitocin, Cytotec and Foley Balloon  Complications: None  Hospital course:  Induction of Labor With Vaginal Delivery   41 y.o. yo [phone] at [redacted]w[redacted]d was admitted to the hospital 04/29/2017 for induction of labor.  Indication for induction: A1 DM.  Patient had an uncomplicated labor course as follows: Membrane Rupture Time/Date: 10:39 PM ,04/29/2017   Intrapartum Procedures: Episiotomy: None [1]                                         Lacerations:  2nd degree [3];Perineal [11]  Patient had delivery of a Viable infant.  Information for the  patient's newborn:  Shanyce, Daris [[id number]]  Delivery Method: Vag-Spont   04/30/2017  Details of delivery can be found in separate delivery note.  Patient had a routine postpartum course.  Patient initially was going to be discharged on 05/01/17, however, due to baby having circumcision, chose to stay the full 48 hours.   Patient is discharged home 05/02/17.  Physical exam        Vitals:   04/30/17 0429 04/30/17 0901 04/30/17 1830 05/01/17 0624  BP: (!) 104/59 120/70 129/74 139/76  Pulse: 70 72 67 61  Resp: 18 18 18 14   Temp: 97.9 F (36.6 C) 97.8 F (36.6 C) 97.9 F (36.6 C) 98.1 F (36.7 C)  TempSrc: Oral  Oral Oral  SpO2:    100%  Weight:      Height:  General: alert, cooperative and no distress Lochia: appropriate Uterine Fundus: firm Incision: N/A DVT Evaluation: No evidence of DVT seen on physical exam. Labs: Recent Labs       Lab Results  Component Value Date   WBC 12.2 (H) 04/30/2017   HGB 12.5 04/30/2017   HCT 37.6 04/30/2017   MCV 67.1 (L) 04/30/2017   PLT 149 (L) 04/30/2017     CMP Latest Ref Rng & Units 04/29/2017  Glucose 65 - 99 mg/dL 85  BUN 6 - 20 mg/dL 8  Creatinine 0.44 - 1.00 mg/dL 0.64  Sodium 135 - 145 mmol/L 135  Potassium 3.5 - 5.1 mmol/L 3.8  Chloride 101 - 111 mmol/L 106  CO2 22 - 32 mmol/L 20(L)  Calcium 8.9 - 10.3 mg/dL 8.8(L)  Total Protein 6.5 - 8.1 g/dL 6.8  Total Bilirubin 0.3 - 1.2 mg/dL 0.5  Alkaline Phos 38 - 126 U/L 100  AST 15 - 41 U/L 17  ALT 14 - 54 U/L 13(L)    Discharge instruction: per After Visit Summary and "Baby and Me Booklet".  After visit meds:  Allergies as of 05/01/2017   No Known Allergies                          Medication List               STOP taking these medications            ACCU-CHEK FASTCLIX LANCETS Misc    glucose blood test strip Commonly known as:  ACCU-CHEK GUIDE    SYMBICORT 160-4.5 MCG/ACT inhaler Generic drug:  budesonide-formoterol                        TAKE these medications            cetirizine 10 MG tablet Commonly known as:  ZYRTEC Take 10 mg by mouth daily as needed for allergies.    ibuprofen 600 MG tablet Commonly known as:  ADVIL,MOTRIN Take 1 tablet (600 mg total) by mouth every 6 (six) hours as needed for moderate pain or cramping.    omeprazole 20 MG capsule Commonly known as:  PRILOSEC Take 1 capsule (20 mg total) by mouth 2 (two) times daily before a meal. What changed:  when to take this  reasons to take this    VOL-TAB RX 29-1 MG Tabs TAKE ONE TABLET BY MOUTH ONCE DAILY                                         Discharge Care Instructions               Start     Ordered   05/01/17 0000  ibuprofen (ADVIL,MOTRIN) 600 MG tablet  Every 6 hours PRN     05/01/17 0942   04/29/17 0000  OB RESULTS CONSOLE GC/Chlamydia    Comments:  This external order was created through the Results Console.   04/29/17 1936      Diet: carb modified diet  Activity: Advance as tolerated. Pelvic rest for 6 weeks.   Outpatient follow up:6 weeks Follow up Appt:Future Appointments Date Time Provider [place]  05/28/2017 8:45 AM Shelly Bombard, MD [place] None   Follow up Visit:No Follow-up on file.  Postpartum contraception: Nuvaring  Newborn Data: Live born female  Birth Weight:  8 lb 1.3 oz (3666 g) APGAR: 7, 9  Baby Feeding: Breast Disposition:home with mother   05/02/2017 Metta Clines, DO PGY-2 Family Medicine Resident  CNM attestation I have seen and examined this patient and agree with above documentation in the resident's note.   Cheryl Acosta is a 41 y.o. [id number] s/p SVD.   Pain is well controlled.  Plan for birth control is NuvaRing vaginal inserts.  Method of Feeding: breast  PE:  BP 132/69 (BP Location: Left Arm)   Pulse 78   Temp 98 F (36.7 C) (Oral)   Resp 18   Ht 5\' 8"  (1.727 m)   Wt (!) 144.7 kg (319 lb)   LMP 07/30/2016   SpO2  100%   Breastfeeding? Unknown   BMI 48.50 kg/m  Fundus firm   Recent Labs  04/30/17 0121 04/30/17 0414  HGB 12.3 12.5  HCT 36.4 37.6     Plan: discharge today - postpartum care discussed - f/u clinic in 4 weeks for postpartum visit   Serita Grammes, CNM 9:00 AM 05/02/2017

## 2017-05-02 NOTE — Lactation Note (Signed)
This note was copied from a baby's chart. Lactation Consultation Note: Mother reports that her milk is coming in. She reports that she has an electric pump at home and plans to start pumping and bottle feeding infant with ebm . Mother reports that she is using a nipple shield when she breast feeds infant. Mother is mostly bottle feeding with formula. Mother advised in treatment to prevent engorgement. Mother is aware of available [place] services and community support.  Patient Name: Cheryl Acosta [id number] Date: 05/02/2017 Reason for consult: Follow-up assessment   Maternal Data    Feeding    LATCH Score                   Interventions    Lactation Tools Discussed/Used     Consult Status Consult Status: Complete    Darla Lesches 05/02/2017, 11:40 AM

## 2017-05-05 ENCOUNTER — Inpatient Hospital Stay (HOSPITAL_COMMUNITY): Admit: 2017-05-05 | Payer: Self-pay | Admitting: Obstetrics and Gynecology

## 2017-05-28 ENCOUNTER — Ambulatory Visit (INDEPENDENT_AMBULATORY_CARE_PROVIDER_SITE_OTHER): Payer: 59 | Admitting: Obstetrics

## 2017-05-28 ENCOUNTER — Encounter: Payer: Self-pay | Admitting: Obstetrics

## 2017-05-28 DIAGNOSIS — M545 Low back pain, unspecified: Secondary | ICD-10-CM

## 2017-05-28 DIAGNOSIS — Z1389 Encounter for screening for other disorder: Secondary | ICD-10-CM | POA: Diagnosis not present

## 2017-05-28 DIAGNOSIS — Z3009 Encounter for other general counseling and advice on contraception: Secondary | ICD-10-CM

## 2017-05-28 DIAGNOSIS — Z30011 Encounter for initial prescription of contraceptive pills: Secondary | ICD-10-CM

## 2017-05-28 DIAGNOSIS — M62838 Other muscle spasm: Secondary | ICD-10-CM

## 2017-05-28 MED ORDER — NORETHINDRONE 0.35 MG PO TABS: 1.0000 | ORAL_TABLET | Freq: Every day | ORAL | 11 refills | Status: DC

## 2017-05-28 MED ORDER — CYCLOBENZAPRINE HCL 10 MG PO TABS: 10.0000 mg | ORAL_TABLET | Freq: Three times a day (TID) | ORAL | 2 refills | Status: DC | PRN

## 2017-05-28 MED ORDER — IBUPROFEN 800 MG PO TABS: 800.0000 mg | ORAL_TABLET | Freq: Three times a day (TID) | ORAL | 5 refills | Status: DC | PRN

## 2017-05-28 MED ORDER — HYDROCODONE-IBUPROFEN 7.5-200 MG PO TABS: 1.0000 | ORAL_TABLET | Freq: Three times a day (TID) | ORAL | 0 refills | Status: DC | PRN

## 2017-05-28 NOTE — Progress Notes (Addendum)
Here for postpartum exam.

## 2017-06-03 NOTE — Progress Notes (Signed)
Post Partum Exam  Cheryl Acosta is a 41 y.o. [phone] female who presents for a postpartum visit. She is 4 weeks postpartum following a spontaneous vaginal delivery. I have fully reviewed the prenatal and intrapartum course. The delivery was at 39w gestational weeks.  Anesthesia: epidural. Postpartum course has been "ok". Baby's course has been good. Baby is feeding by pumped breast milk from bottle, occasional supplements with formula. Bleeding no bleeding. Bowel function is normal. Bladder function is abnormal: leaking urine randomly. Patient is not sexually active. Contraception method is none. Postpartum depression screening:  SCORE OF 7.  The following portions of the patient's history were reviewed and updated as appropriate: allergies, current medications, past family history, past medical history, past social history, past surgical history and problem list.  Review of Systems A comprehensive review of systems was negative.    Objective:  Blood pressure 124/64, pulse 70, weight 297 lb 9.6 oz (135 kg), currently breastfeeding.  General:  alert and no distress   Breasts:  inspection negative, no nipple discharge or bleeding, no masses or nodularity palpable  Lungs: clear to auscultation bilaterally  Heart:  regular rate and rhythm, S1, S2 normal, no murmur, click, rub or gallop  Abdomen: soft, non-tender; bowel sounds normal; no masses,  no organomegaly   Assessment:    1. Postpartum care following vaginal delivery  2. Acute midline low back pain without sciatica Rx: - HYDROcodone-ibuprofen (VICOPROFEN) 7.5-200 MG tablet; Take 1 tablet by mouth every 8 (eight) hours as needed for moderate pain.  Dispense: 30 tablet; Refill: 0  3. Muscle spasm Rx: - cyclobenzaprine (FLEXERIL) 10 MG tablet; Take 1 tablet (10 mg total) by mouth every 8 (eight) hours as needed for muscle spasms.  Dispense: 30 tablet; Refill: 2 - ibuprofen (ADVIL,MOTRIN) 800 MG tablet; Take 1 tablet (800 mg total) by mouth  every 8 (eight) hours as needed.  Dispense: 30 tablet; Refill: 5 - Ambulatory referral to Physical Therapy  4. Encounter for other general counseling and advice on contraception - wants OCP's  5. Encounter for initial prescription of contraceptive pills Rx: - norethindrone (MICRONOR,CAMILA,ERRIN) 0.35 MG tablet; Take 1 tablet (0.35 mg total) by mouth daily.  Dispense: 1 Package; Refill: 11  Plan:   1. Contraception: oral progesterone-only contraceptive 2. Micronor Rx 3. Follow up in: 2 weeks or as needed.

## 2017-06-04 ENCOUNTER — Encounter: Payer: Self-pay | Admitting: *Deleted

## 2017-06-04 ENCOUNTER — Encounter: Payer: Self-pay | Admitting: Obstetrics

## 2017-06-05 ENCOUNTER — Encounter: Payer: Self-pay | Admitting: Obstetrics

## 2017-06-11 ENCOUNTER — Encounter: Payer: Self-pay | Admitting: Physical Therapy

## 2017-06-11 ENCOUNTER — Encounter: Payer: Self-pay | Admitting: Obstetrics

## 2017-06-11 ENCOUNTER — Ambulatory Visit: Payer: 59 | Attending: Obstetrics | Admitting: Physical Therapy

## 2017-06-11 DIAGNOSIS — M62830 Muscle spasm of back: Secondary | ICD-10-CM | POA: Diagnosis present

## 2017-06-11 DIAGNOSIS — M545 Low back pain, unspecified: Secondary | ICD-10-CM

## 2017-06-11 NOTE — Therapy (Signed)
[facility] [place], [place], [address] Phone: [phone]   Fax:  [phone]  Physical Therapy Evaluation  Patient Details  Name: Cheryl Acosta MRN: [id number] Date of Birth: Jul 16, 1976 Referring Provider: Jodi Mourning  Encounter Date: 06/11/2017      PT End of Session - 06/11/17 1513    Visit Number 1   Date for PT Re-Evaluation 08/11/17   PT Start Time 1455   PT Stop Time 1539   PT Time Calculation (min) 44 min   Activity Tolerance Patient tolerated treatment well   Behavior During Therapy [facility] for tasks assessed/performed      Past Medical History:  Diagnosis Date  . Anemia    not on Iron  . Asthma    on Symbicort inhaler; uses prn; last used 04/28/17  . Diabetes mellitus without complication ([place])   . Environmental allergies   . GERD (gastroesophageal reflux disease)    during pregnancy only  . Gestational diabetes    diet-controlled    Past Surgical History:  Procedure Laterality Date  . BREAST SURGERY  2003   BILATERAL LUMPECTOMY   . DILATION AND EVACUATION N/A 12/12/2015   Procedure: DILATATION AND EVACUATION;  Surgeon: Shelly Bombard, MD;  Location: [facility];  Service: Gynecology;  Laterality: N/A;  . THUMB FUSION      There were no vitals filed for this visit.       Subjective Assessment - 06/11/17 1458    Subjective Patient reports that she had a baby about 6 weeks ago, she reports having an epidural and that the position was very uncomfortable during child birth.  She is taking ibuprofen.     Limitations Standing;Walking;House hold activities;Sitting   Patient Stated Goals have less pain   Currently in Pain? Yes   Pain Score 7    Pain Location Back   Pain Orientation Mid;Lower   Pain Descriptors / Indicators Aching;Dull   Pain Type Acute pain   Pain Onset 1 to 4 weeks ago   Pain Frequency Constant   Aggravating Factors  sitting, standing, lifting, pain up to 9/10   Pain  Relieving Factors sitting with feet elevated and back straight at best pain a 4/10   Effect of Pain on Daily Activities limits everything due to pain            [facility] PT Assessment - 06/11/17 0001      Assessment   Medical Diagnosis mid and low back pain   Referring Provider Jodi Mourning   Onset Date/Surgical Date 04/30/17   Prior Therapy no     Precautions   Precautions None     Balance Screen   Has the patient fallen in the past 6 months No   Has the patient had a decrease in activity level because of a fear of falling?  No   Is the patient reluctant to leave their home because of a fear of falling?  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     Leisure no exercise     Posture/Postural Control   Posture Comments fwd head, rounded shoulders     ROM / Strength   AROM / PROM / Strength AROM;Strength  AROM   Overall AROM Comments Lumbar ROM was decreased 75% with c/o stiffness and pain     Strength   Overall Strength Comments LE's 4/5 with pain in the back     Flexibility   Soft Tissue Assessment /Muscle Length --  tightness in the HS and calf as well as the piriformis     Palpation   Palpation comment she is very tight and very tender in the thoracic and lumbar parapsinals            Objective measurements completed on examination: See above findings.          [facility] Adult PT Treatment/Exercise - 06/11/17 0001      Modalities   Modalities Electrical Stimulation;Moist Heat     Moist Heat Therapy   Number Minutes Moist Heat 15 Minutes   Moist Heat Location Lumbar Spine     Electrical Stimulation   Electrical Stimulation Location T/L area   Electrical Stimulation Action IFC   Electrical Stimulation Parameters supine    Electrical Stimulation Goals Pain                PT Education - 06/11/17 1513    Education provided Yes   Education Details Wms Flexion   Person(s) Educated Patient   Methods Explanation;Demonstration;Handout   Comprehension Verbalized understanding          PT Short Term Goals - 06/11/17 1518      PT SHORT TERM GOAL #1   Title independent with initial HEP   Time 2   Period Weeks   Status New           PT Long Term Goals - 06/11/17 1518      PT LONG TERM GOAL #1   Title decrease pain 50%   Time 8   Period Weeks   Status New     PT LONG TERM GOAL #2   Title increase lumbar ROM 50%   Time 8   Period Weeks   Status New     PT LONG TERM GOAL #3   Title understand proper posture and body mechanics   Time 8   Period Weeks   Status New     PT LONG TERM GOAL #4   Title tolerate standing >30 minutes   Time 8   Period Weeks   Status New                Plan - 06/11/17 1515    Clinical Impression Statement Patient had a baby 6 weeks ago, she reports having significant back pain and spasms since that time, she reports having an epidural and then during birthing being in an awkward position.  She is very tight in the thoracic and lumbar area, her ROM is very limited, she is tight in the LE's   History and Personal Factors relevant to plan of care: recently had a baby   Clinical Presentation Evolving   Clinical Decision Making Low   Rehab Potential Good   PT Frequency 2x / week   PT Duration 8 weeks   PT Treatment/Interventions Electrical Stimulation;Moist Heat;Therapeutic activities;Therapeutic exercise;Patient/family education;Manual techniques   PT Next Visit Plan slowly add exercises   Consulted and Agree with Plan of Care Patient      Patient will benefit from skilled therapeutic intervention in order to improve the following deficits and impairments:  Decreased activity tolerance, Decreased mobility, Decreased strength, Improper body  mechanics, Impaired flexibility, Pain, Increased muscle spasms, Decreased range of motion  Visit Diagnosis: Acute bilateral low back pain without sciatica - Plan: PT plan of care cert/re-cert  Muscle spasm of back - Plan: PT plan of care cert/re-cert     Problem List Patient Active Problem List   Diagnosis Date Noted  . SVD (spontaneous vaginal delivery) 04/30/2017  . Gestational diabetes, diet controlled 04/29/2017  . Gestational diabetes 04/23/2017  . Asthma exacerbation 02/09/2017  . Hyperglycemia 02/09/2017  . Supervision of other high risk pregnancy, antepartum 11/05/2016  . Increased nuchal translucency space on fetal ultrasound 10/29/2016  . AMA (advanced maternal age) multigravida 42+, unspecified trimester 10/08/2016    Sumner Boast., PT 06/11/2017, 3:22 PM  [facility] [place], [place], [address] Phone: [phone]   Fax:  [phone]  Name: Cheryl Acosta MRN: [id number] Date of Birth: [birth year]

## 2017-06-12 ENCOUNTER — Encounter: Payer: Self-pay | Admitting: Obstetrics

## 2017-06-12 ENCOUNTER — Ambulatory Visit (INDEPENDENT_AMBULATORY_CARE_PROVIDER_SITE_OTHER): Payer: 59 | Admitting: Obstetrics

## 2017-06-12 DIAGNOSIS — M545 Low back pain, unspecified: Secondary | ICD-10-CM

## 2017-06-12 DIAGNOSIS — Z3041 Encounter for surveillance of contraceptive pills: Secondary | ICD-10-CM

## 2017-06-12 NOTE — Progress Notes (Signed)
..  Post Partum Exam  Cheryl Acosta is a 41 y.o. [phone] female who presents for a postpartum visit. She is 6 weeks postpartum following a spontaneous vaginal delivery. I have fully reviewed the prenatal and intrapartum course. The delivery was at 39.1 gestational weeks.  Anesthesia: epidural. Postpartum course has been good. Baby's course has been good. Baby is feeding by breast. Bleeding no bleeding. Bowel function is normal. Bladder function is normal. Patient is not sexually active. Contraception method is OCP (estrogen/progesterone). Postpartum depression screening:neg  The following portions of the patient's history were reviewed and updated as appropriate: allergies, current medications, past family history, past medical history, past social history, past surgical history and problem list.  Review of Systems Musculoskeletal:positive for back pain    Objective:  Blood pressure 120/82, pulse 76, height 5\' 7"  (1.702 m), weight (!) 302 lb (137 kg), currently breastfeeding.  General:  alert and no distress   Breasts:  inspection negative, no nipple discharge or bleeding, no masses or nodularity palpable  Lungs: clear to auscultation bilaterally  Heart:  regular rate and rhythm, S1, S2 normal, no murmur, click, rub or gallop  Abdomen: normal findings: no masses palpable, no organomegaly and soft, non-tender   Vulva:  normal  Vagina: normal vagina  Cervix:  no cervical motion tenderness  Corpus: normal size, contour, position, consistency, mobility, non-tender  Adnexa:  no mass, fullness, tenderness  Rectal Exam: Not performed.        Assessment:   1. Postpartum care following vaginal delivery - doing well  2. Encounter for surveillance of contraceptive pills - breastfeeding.  Micronor Rx  3. Acute midline low back pain without sciatica - Physical Therapy ongoing - continue Ibuprofen / Flexeril prn   Plan:   1. Contraception: oral progesterone-only contraceptive 2. Continue  PNV's 3. Follow up in: 6 months or as needed.

## 2017-06-14 ENCOUNTER — Ambulatory Visit: Payer: 59 | Attending: Obstetrics | Admitting: Physical Therapy

## 2017-06-14 ENCOUNTER — Encounter: Payer: Self-pay | Admitting: Physical Therapy

## 2017-06-14 DIAGNOSIS — M62830 Muscle spasm of back: Secondary | ICD-10-CM | POA: Diagnosis present

## 2017-06-14 DIAGNOSIS — M545 Low back pain, unspecified: Secondary | ICD-10-CM

## 2017-06-14 NOTE — Therapy (Signed)
[place], [place], [address] Phone: [phone]   Fax:  [phone]  Physical Therapy Treatment  Patient Details  Name: Cheryl Acosta MRN: [id number] Date of Birth: [birth year] Referring Provider: Jodi Mourning  Encounter Date: 06/14/2017      PT End of Session - 06/14/17 0909    Visit Number 2   Date for PT Re-Evaluation 08/11/17   PT Start Time 0848   PT Stop Time 0935   PT Time Calculation (min) 47 min      Past Medical History:  Diagnosis Date  . Anemia    not on Iron  . Asthma    on Symbicort inhaler; uses prn; last used 04/28/17  . Diabetes mellitus without complication ([place])   . Environmental allergies   . GERD (gastroesophageal reflux disease)    during pregnancy only  . Gestational diabetes    diet-controlled    Past Surgical History:  Procedure Laterality Date  . BREAST SURGERY  2003   BILATERAL LUMPECTOMY   . DILATION AND EVACUATION N/A 12/12/2015   Procedure: DILATATION AND EVACUATION;  Surgeon: Shelly Bombard, MD;  Location: [facility];  Service: Gynecology;  Laterality: N/A;  . THUMB FUSION      There were no vitals filed for this visit.      Subjective Assessment - 06/14/17 0905    Subjective stretches are difficult but I am trying   Pain Score 7    Pain Location Back                         [facility] Adult PT Treatment/Exercise - 06/14/17 0001      Exercises   Exercises Lumbar;Knee/Hip     Lumbar Exercises: Supine   Ab Set 10 reps   Clam 10 reps  red tband   Clam Limitations add ball squeeze 10 times   Bent Knee Raise 10 reps  red tband   Other Supine Lumbar Exercises KTC and obl on ball 10 each, attempted brridge but too painfull     Modalities   Modalities Ultrasound     Moist Heat Therapy   Number Minutes Moist Heat 15 Minutes   Moist Heat Location Lumbar Spine     Electrical Stimulation   Electrical Stimulation Location T/L area   Electrical Stimulation Action IFC   Electrical Stimulation Parameters supine   Electrical Stimulation Goals Pain     Ultrasound   Ultrasound Location T/L   Ultrasound Parameters 1.4 w/cm 2 100%   Ultrasound Goals Pain                  PT Short Term Goals - 06/11/17 1518      PT SHORT TERM GOAL #1   Title independent with initial HEP   Time 2   Period Weeks   Status New           PT Long Term Goals - 06/11/17 1518      PT LONG TERM GOAL #1   Title decrease pain 50%   Time 8   Period Weeks   Status New     PT LONG TERM GOAL #2   Title increase lumbar ROM 50%   Time 8   Period Weeks   Status New     PT LONG TERM GOAL #3   Title understand proper posture and body mechanics   Time 8   Period Weeks   Status New  PT LONG TERM GOAL #4   Title tolerate standing >30 minutes   Time 8   Period Weeks   Status New               Plan - 06/14/17 0910    Clinical Impression Statement pt extremely limited in mvmt and ex d/t pain, very slow with ex and some unable to do. attempted STW but pt did not tolerated so added some pressure with [place] to help relax muscles and pt tolerated fair   PT Treatment/Interventions Electrical Stimulation;Moist Heat;Therapeutic activities;Therapeutic exercise;Patient/family education;Manual techniques   PT Next Visit Plan slowly add exercises and flexibility      Patient will benefit from skilled therapeutic intervention in order to improve the following deficits and impairments:  Decreased activity tolerance, Decreased mobility, Decreased strength, Improper body mechanics, Impaired flexibility, Pain, Increased muscle spasms, Decreased range of motion  Visit Diagnosis: Acute bilateral low back pain without sciatica  Muscle spasm of back     Problem List Patient Active Problem List   Diagnosis Date Noted  . SVD (spontaneous vaginal delivery) 04/30/2017  . Gestational diabetes, diet controlled 04/29/2017  .  Gestational diabetes 04/23/2017  . Asthma exacerbation 02/09/2017  . Hyperglycemia 02/09/2017  . Supervision of other high risk pregnancy, antepartum 11/05/2016  . Increased nuchal translucency space on fetal ultrasound 10/29/2016  . AMA (advanced maternal age) multigravida 54+, unspecified trimester 10/08/2016    Robel Wuertz,ANGIE PTA 06/14/2017, 9:15 AM  [place], [place], [address] Phone: [phone]   Fax:  [phone]  Name: Yarexi Pawlicki MRN: [id number] Date of Birth: 1975-11-17

## 2017-06-18 ENCOUNTER — Ambulatory Visit: Payer: 59 | Admitting: Physical Therapy

## 2017-06-20 ENCOUNTER — Ambulatory Visit: Payer: 59 | Admitting: Physical Therapy

## 2017-06-20 DIAGNOSIS — M545 Low back pain, unspecified: Secondary | ICD-10-CM

## 2017-06-20 DIAGNOSIS — M62830 Muscle spasm of back: Secondary | ICD-10-CM

## 2017-06-20 NOTE — Therapy (Signed)
[address] [place], [place], [address] Phone: [phone]   Fax:  [phone]  Physical Therapy Treatment  Patient Details  Name: Cheryl Acosta MRN: [id number] Date of Birth: [birth year] Referring Provider: Jodi Mourning   Encounter Date: 06/20/2017  PT End of Session - 06/20/17 1641    Visit Number  3    Date for PT Re-Evaluation  08/11/17    PT Start Time  3903    PT Stop Time  1700    PT Time Calculation (min)  45 min       Past Medical History:  Diagnosis Date  . Anemia    not on Iron  . Asthma    on Symbicort inhaler; uses prn; last used 04/28/17  . Diabetes mellitus without complication ([place])   . Environmental allergies   . GERD (gastroesophageal reflux disease)    during pregnancy only  . Gestational diabetes    diet-controlled    Past Surgical History:  Procedure Laterality Date  . BREAST SURGERY  2003   BILATERAL LUMPECTOMY   . THUMB FUSION      There were no vitals filed for this visit.  Subjective Assessment - 06/20/17 1620    Subjective  some better    Currently in Pain?  Yes    Pain Score  6     Pain Location  Back         [facility] PT Assessment - 06/20/17 0001      AROM   Overall AROM Comments  Lumbar ROM decreased 75% ext all otheres decreased 50%      Palpation   Palpation comment  she continues to be very tight and very tender in the thoracic and lumbar parapsinals                  [facility] Adult PT Treatment/Exercise - 06/20/17 0001      Lumbar Exercises: Aerobic   Tread Mill  Nustep L 3 5 min    UBE (Upper Arm Bike)  2 fwd/2 back L 3      Lumbar Exercises: Standing   Other Standing Lumbar Exercises  scap stab 3 way 10 times red tband      Lumbar Exercises: Seated   Other Seated Lumbar Exercises  sit fit pelvic ROM, marching,hip abd, LAQ      Moist Heat Therapy   Number Minutes Moist Heat  15 Minutes    Moist Heat Location  Lumbar Spine      Electrical Stimulation   Electrical Stimulation Location  T/L area    Electrical Stimulation Action  IFC    Electrical Stimulation Parameters  supine    Electrical Stimulation Goals  Pain               PT Short Term Goals - 06/11/17 1518      PT SHORT TERM GOAL #1   Title  independent with initial HEP    Time  2    Period  Weeks    Status  New        PT Long Term Goals - 06/20/17 1644      PT LONG TERM GOAL #1   Title  decrease pain 50%    Status  On-going      PT LONG TERM GOAL #2   Title  increase lumbar ROM 50%    Status  On-going      PT LONG TERM GOAL #3   Title  understand  proper posture and body mechanics    Status  On-going      PT LONG TERM GOAL #4   Title  tolerate standing >30 minutes    Status  On-going            Plan - 06/20/17 1641    Clinical Impression Statement  pt still limited with ROM esp ext and very guarded with tightness throughout T/L. Pt is moving some better and toleated addition of ex fair. pt did not tolerate PROM to LE- too guarded    PT Treatment/Interventions  Electrical Stimulation;Moist Heat;Therapeutic activities;Therapeutic exercise;Patient/family education;Manual techniques    PT Next Visit Plan  slowly add exercises and flexibility       Patient will benefit from skilled therapeutic intervention in order to improve the following deficits and impairments:  Decreased activity tolerance, Decreased mobility, Decreased strength, Improper body mechanics, Impaired flexibility, Pain, Increased muscle spasms, Decreased range of motion  Visit Diagnosis: Muscle spasm of back  Acute bilateral low back pain without sciatica     Problem List Patient Active Problem List   Diagnosis Date Noted  . SVD (spontaneous vaginal delivery) 04/30/2017  . Gestational diabetes, diet controlled 04/29/2017  . Gestational diabetes 04/23/2017  . Asthma exacerbation 02/09/2017  . Hyperglycemia 02/09/2017  . Supervision of other high  risk pregnancy, antepartum 11/05/2016  . Increased nuchal translucency space on fetal ultrasound 10/29/2016  . AMA (advanced maternal age) multigravida 70+, unspecified trimester 10/08/2016    PAYSEUR,ANGIE PTA 06/20/2017, 4:44 PM  [place] [place], [place], [address] Phone: [phone]   Fax:  [phone]  Name: Cheryl Acosta MRN: [id number] Date of Birth: 1976-03-17

## 2017-06-25 ENCOUNTER — Encounter: Payer: Self-pay | Admitting: Physical Therapy

## 2017-06-25 ENCOUNTER — Ambulatory Visit: Payer: 59 | Admitting: Physical Therapy

## 2017-06-25 DIAGNOSIS — M545 Low back pain, unspecified: Secondary | ICD-10-CM

## 2017-06-25 DIAGNOSIS — M62830 Muscle spasm of back: Secondary | ICD-10-CM

## 2017-06-25 NOTE — Therapy (Signed)
[place] [address] [place], [place], [address] Phone: [phone]   Fax:  [phone]  Physical Therapy Treatment  Patient Details  Name: Cheryl Acosta MRN: [id number] Date of Birth: 11/04/1975 Referring Provider: Jodi Mourning   Encounter Date: 06/25/2017  PT End of Session - 06/25/17 0924    Visit Number  4    Date for PT Re-Evaluation  08/11/17    PT Start Time  0845    PT Stop Time  0939    PT Time Calculation (min)  54 min    Activity Tolerance  Patient tolerated treatment well    Behavior During Therapy  [facility] for tasks assessed/performed       Past Medical History:  Diagnosis Date  . Anemia    not on Iron  . Asthma    on Symbicort inhaler; uses prn; last used 04/28/17  . Diabetes mellitus without complication ([place])   . Environmental allergies   . GERD (gastroesophageal reflux disease)    during pregnancy only  . Gestational diabetes    diet-controlled    Past Surgical History:  Procedure Laterality Date  . BREAST SURGERY  2003   BILATERAL LUMPECTOMY   . THUMB FUSION      There were no vitals filed for this visit.  Subjective Assessment - 06/25/17 0846    Subjective  "A little better"    Currently in Pain?  Yes    Pain Score  7     Pain Location  Back                      [facility] Adult PT Treatment/Exercise - 06/25/17 0001      Lumbar Exercises: Aerobic   Stationary Bike  L0 x3 min     Tread Mill  Nustep L 4 6 min      Lumbar Exercises: Machines for Strengthening   Cybex Knee Extension  5lb 2x10     Cybex Knee Flexion  25lb 2x10       Lumbar Exercises: Standing   Row  10 reps;Theraband x2    Theraband Level (Row)  Level 3 (Green)    Shoulder Extension  10 reps;Theraband x2    Theraband Level (Shoulder Extension)  Level 3 (Green)    Other Standing Lumbar Exercises  Ar presses 15lbx10 each       Lumbar Exercises: Seated   Other Seated Lumbar Exercises  sit fit pelvic ROM       Moist Heat Therapy   Number Minutes Moist Heat  15 Minutes    Moist Heat Location  Lumbar Spine      Electrical Stimulation   Electrical Stimulation Location  T/L area    Electrical Stimulation Action  IFC    Electrical Stimulation Parameters  supine    Electrical Stimulation Goals  Pain               PT Short Term Goals - 06/11/17 1518      PT SHORT TERM GOAL #1   Title  independent with initial HEP    Time  2    Period  Weeks    Status  New        PT Long Term Goals - 06/20/17 1644      PT LONG TERM GOAL #1   Title  decrease pain 50%    Status  On-going      PT LONG TERM GOAL #2   Title  increase  lumbar ROM 50%    Status  On-going      PT LONG TERM GOAL #3   Title  understand proper posture and body mechanics    Status  On-going      PT LONG TERM GOAL #4   Title  tolerate standing >30 minutes    Status  On-going            Plan - 06/25/17 0925    Clinical Impression Statement  Pt with a better tolerance to activity. Progressed to some LE strengthening interventions with machine. tolerated standing postural strengthening interventions as well.     Rehab Potential  Good    PT Frequency  2x / week    PT Duration  8 weeks    PT Treatment/Interventions  Electrical Stimulation;Moist Heat;Therapeutic activities;Therapeutic exercise;Patient/family education;Manual techniques    PT Next Visit Plan  slowly add exercises and flexibility       Patient will benefit from skilled therapeutic intervention in order to improve the following deficits and impairments:  Decreased activity tolerance, Decreased mobility, Decreased strength, Improper body mechanics, Impaired flexibility, Pain, Increased muscle spasms, Decreased range of motion  Visit Diagnosis: Muscle spasm of back  Acute bilateral low back pain without sciatica     Problem List Patient Active Problem List   Diagnosis Date Noted  . SVD (spontaneous vaginal delivery) 04/30/2017  .  Gestational diabetes, diet controlled 04/29/2017  . Gestational diabetes 04/23/2017  . Asthma exacerbation 02/09/2017  . Hyperglycemia 02/09/2017  . Supervision of other high risk pregnancy, antepartum 11/05/2016  . Increased nuchal translucency space on fetal ultrasound 10/29/2016  . AMA (advanced maternal age) multigravida 40+, unspecified trimester 10/08/2016    Scot Jun, PTA 06/25/2017, 9:27 AM  [place], [place], [address] Phone: [phone]   Fax:  [phone]  Name: Cheryl Acosta MRN: [id number] Date of Birth: Jan 26, 1976

## 2017-06-27 ENCOUNTER — Ambulatory Visit: Payer: 59 | Admitting: Physical Therapy

## 2017-06-27 ENCOUNTER — Encounter: Payer: Self-pay | Admitting: Physical Therapy

## 2017-07-02 ENCOUNTER — Encounter: Payer: Self-pay | Admitting: Physical Therapy

## 2017-07-02 ENCOUNTER — Ambulatory Visit: Payer: 59 | Admitting: Physical Therapy

## 2017-07-02 DIAGNOSIS — M545 Low back pain, unspecified: Secondary | ICD-10-CM

## 2017-07-02 DIAGNOSIS — M62830 Muscle spasm of back: Secondary | ICD-10-CM

## 2017-07-02 NOTE — Therapy (Signed)
[address] [place], [place], [address] Phone: [phone]   Fax:  [phone]  Physical Therapy Treatment  Patient Details  Name: Cheryl Acosta MRN: [id number] Date of Birth: [birth year] Referring Provider: Jodi Mourning   Encounter Date: 07/02/2017  PT End of Session - 07/02/17 0926    Visit Number  5    Date for PT Re-Evaluation  08/11/17    PT Start Time  0845    PT Stop Time  0940    PT Time Calculation (min)  55 min    Activity Tolerance  Patient tolerated treatment well    Behavior During Therapy  [facility] for tasks assessed/performed       Past Medical History:  Diagnosis Date  . Anemia    not on Iron  . Asthma    on Symbicort inhaler; uses prn; last used 04/28/17  . Diabetes mellitus without complication ([place])   . Environmental allergies   . GERD (gastroesophageal reflux disease)    during pregnancy only  . Gestational diabetes    diet-controlled    Past Surgical History:  Procedure Laterality Date  . BREAST SURGERY  2003   BILATERAL LUMPECTOMY   . DILATATION AND EVACUATION N/A 12/12/2015   Performed by Shelly Bombard, MD at [facility]  . THUMB FUSION      There were no vitals filed for this visit.  Subjective Assessment - 07/02/17 0851    Subjective  "a little better"    Currently in Pain?  Yes    Pain Score  6     Pain Location  Back         [facility] PT Assessment - 07/02/17 0001      AROM   Overall AROM Comments  Lumbar ROM WFL, Ext dec 25%                  [facility] Adult PT Treatment/Exercise - 07/02/17 0001      Lumbar Exercises: Aerobic   Tread Mill  Nustep L 4 7 min      Lumbar Exercises: Machines for Strengthening   Cybex Knee Extension  5lb 2x10     Cybex Knee Flexion  25lb 2x10     Other Lumbar Machine Exercise  Row & Lats 25lb 2x10       Lumbar Exercises: Standing   Shoulder Extension  10 reps;Theraband x2    Theraband Level (Shoulder Extension)  Level 3  (Green)    Other Standing Lumbar Exercises  Overhead ext yellow ball x10    Other Standing Lumbar Exercises  Ar presses 20lb x10 each       Lumbar Exercises: Seated   Sit to Stand  10 reps x2, holding red ball       Moist Heat Therapy   Number Minutes Moist Heat  15 Minutes    Moist Heat Location  Lumbar Spine      Electrical Stimulation   Electrical Stimulation Location  T/L area    Electrical Stimulation Action  IFC    Electrical Stimulation Parameters  supine    Electrical Stimulation Goals  Pain               PT Short Term Goals - 07/02/17 0856      PT SHORT TERM GOAL #1   Title  independent with initial HEP    Status  Achieved        PT Long Term Goals - 07/02/17 5638  PT LONG TERM GOAL #1   Title  decrease pain 50%    Status  On-going      PT LONG TERM GOAL #2   Title  increase lumbar ROM 50%    Status  Achieved      PT LONG TERM GOAL #3   Title  understand proper posture and body mechanics    Status  Partially Met      PT LONG TERM GOAL #4   Title  tolerate standing >30 minutes    Status  Partially Met            Plan - 07/02/17 0927    Clinical Impression Statement  pt has progressed towards goals,completed all of today's exercises well with no reports of increase pain. Pt has met lumbar AROM goals.    Rehab Potential  Good    PT Frequency  2x / week    PT Duration  8 weeks    PT Treatment/Interventions  Electrical Stimulation;Moist Heat;Therapeutic activities;Therapeutic exercise;Patient/family education;Manual techniques    PT Next Visit Plan  slowly add exercises and flexibility       Patient will benefit from skilled therapeutic intervention in order to improve the following deficits and impairments:  Decreased activity tolerance, Decreased mobility, Decreased strength, Improper body mechanics, Impaired flexibility, Pain, Increased muscle spasms, Decreased range of motion  Visit Diagnosis: Muscle spasm of back  Acute  bilateral low back pain without sciatica     Problem List Patient Active Problem List   Diagnosis Date Noted  . SVD (spontaneous vaginal delivery) 04/30/2017  . Gestational diabetes, diet controlled 04/29/2017  . Gestational diabetes 04/23/2017  . Asthma exacerbation 02/09/2017  . Hyperglycemia 02/09/2017  . Supervision of other high risk pregnancy, antepartum 11/05/2016  . Increased nuchal translucency space on fetal ultrasound 10/29/2016  . AMA (advanced maternal age) multigravida 4+, unspecified trimester 10/08/2016    Scot Jun, PTA 07/02/2017, 9:28 AM  [place], [place], [address] Phone: [phone]   Fax:  [phone]  Name: Shereese Bonnie MRN: [id number] Date of Birth: May 05, 1976

## 2017-07-09 ENCOUNTER — Ambulatory Visit: Payer: 59 | Admitting: Physical Therapy

## 2017-07-09 ENCOUNTER — Encounter: Payer: Self-pay | Admitting: Physical Therapy

## 2017-07-09 DIAGNOSIS — M545 Low back pain, unspecified: Secondary | ICD-10-CM

## 2017-07-09 DIAGNOSIS — M62830 Muscle spasm of back: Secondary | ICD-10-CM

## 2017-07-09 NOTE — Therapy (Signed)
[place], [place], [address] Phone: [phone]   Fax:  [phone]  Physical Therapy Treatment  Patient Details  Name: Arlie Posch MRN: [id number] Date of Birth: [birth year] Referring Provider: Jodi Mourning   Encounter Date: 07/09/2017  PT End of Session - 07/09/17 0927    Visit Number  6    Date for PT Re-Evaluation  08/11/17    PT Start Time  6222    PT Stop Time  0941    PT Time Calculation (min)  57 min    Activity Tolerance  Patient tolerated treatment well    Behavior During Therapy  [facility] for tasks assessed/performed       Past Medical History:  Diagnosis Date  . Anemia    not on Iron  . Asthma    on Symbicort inhaler; uses prn; last used 04/28/17  . Diabetes mellitus without complication ([place])   . Environmental allergies   . GERD (gastroesophageal reflux disease)    during pregnancy only  . Gestational diabetes    diet-controlled    Past Surgical History:  Procedure Laterality Date  . BREAST SURGERY  2003   BILATERAL LUMPECTOMY   . DILATION AND EVACUATION N/A 12/12/2015   Procedure: DILATATION AND EVACUATION;  Surgeon: Shelly Bombard, MD;  Location: [facility];  Service: Gynecology;  Laterality: N/A;  . THUMB FUSION      There were no vitals filed for this visit.  Subjective Assessment - 07/09/17 0844    Subjective  "So fat so good, this morning my knee hurt, but I don't know why"    Currently in Pain?  Yes    Pain Score  6     Pain Location  Back                      [facility] Adult PT Treatment/Exercise - 07/09/17 0001      Lumbar Exercises: Aerobic   Tread Mill  Nustep L 4 7 min      Lumbar Exercises: Machines for Strengthening   Cybex Knee Flexion  35lb 2x10     Leg Press  30lb 3x10    Other Lumbar Machine Exercise  Row & Lats 25lb 2x10       Lumbar Exercises: Standing   Other Standing Lumbar Exercises  AR presses 25lb x10 each       Lumbar Exercises:  Seated   Other Seated Lumbar Exercises  Crunches on physo ball 3 sec x10      Lumbar Exercises: Supine   Bridge  15 reps;2 seconds      Knee/Hip Exercises: Stretches   Passive Hamstring Stretch  4 reps;10 seconds    Piriformis Stretch  Both;3 reps;10 seconds      Moist Heat Therapy   Number Minutes Moist Heat  15 Minutes    Moist Heat Location  Lumbar Spine      Electrical Stimulation   Electrical Stimulation Location  T/L area    Electrical Stimulation Action  IFC    Electrical Stimulation Parameters  supine    Electrical Stimulation Goals  Pain               PT Short Term Goals - 07/02/17 0856      PT SHORT TERM GOAL #1   Title  independent with initial HEP    Status  Achieved        PT Long Term Goals - 07/09/17 9798  PT LONG TERM GOAL #1   Title  decrease pain 50%    Status  Partially Met      PT LONG TERM GOAL #4   Title  tolerate standing >30 minutes    Status  Achieved            Plan - 07/09/17 0928    Clinical Impression Statement  Pt reports decrease tightness overall in her back but still has some pain. No issues reported with today's exercises. LE quickly fatigues with seated curls and extensions.     Rehab Potential  Good    PT Treatment/Interventions  Electrical Stimulation;Moist Heat;Therapeutic activities;Therapeutic exercise;Patient/family education;Manual techniques    PT Next Visit Plan  slowly add exercises and flexibility       Patient will benefit from skilled therapeutic intervention in order to improve the following deficits and impairments:  Decreased activity tolerance, Decreased mobility, Decreased strength, Improper body mechanics, Impaired flexibility, Pain, Increased muscle spasms, Decreased range of motion  Visit Diagnosis: Acute bilateral low back pain without sciatica  Muscle spasm of back     Problem List Patient Active Problem List   Diagnosis Date Noted  . SVD (spontaneous vaginal delivery) 04/30/2017   . Gestational diabetes, diet controlled 04/29/2017  . Gestational diabetes 04/23/2017  . Asthma exacerbation 02/09/2017  . Hyperglycemia 02/09/2017  . Supervision of other high risk pregnancy, antepartum 11/05/2016  . Increased nuchal translucency space on fetal ultrasound 10/29/2016  . AMA (advanced maternal age) multigravida 45+, unspecified trimester 10/08/2016    Scot Jun, PTA 07/09/2017, 9:30 AM  [facility] [place], [place], [address] Phone: [phone]   Fax:  [phone]  Name: Janelli Welling MRN: [id number] Date of Birth: 09-05-1975

## 2017-07-11 ENCOUNTER — Encounter: Payer: Self-pay | Admitting: Physical Therapy

## 2017-07-11 ENCOUNTER — Ambulatory Visit: Payer: 59 | Admitting: Physical Therapy

## 2017-07-11 DIAGNOSIS — M62830 Muscle spasm of back: Secondary | ICD-10-CM

## 2017-07-11 DIAGNOSIS — M545 Low back pain, unspecified: Secondary | ICD-10-CM

## 2017-07-11 NOTE — Therapy (Signed)
[place] [place], [place], [address] Phone: [phone]   Fax:  [phone]  Physical Therapy Treatment  Patient Details  Name: Cheryl Acosta MRN: [id number] Date of Birth: 02-04-1976 Referring Provider: Jodi Mourning   Encounter Date: 07/11/2017  PT End of Session - 07/11/17 0927    Visit Number  7    Date for PT Re-Evaluation  08/11/17    PT Start Time  0844    PT Stop Time  0940    PT Time Calculation (min)  56 min    Activity Tolerance  Patient tolerated treatment well    Behavior During Therapy  [facility] for tasks assessed/performed       Past Medical History:  Diagnosis Date  . Anemia    not on Iron  . Asthma    on Symbicort inhaler; uses prn; last used 04/28/17  . Diabetes mellitus without complication ([place])   . Environmental allergies   . GERD (gastroesophageal reflux disease)    during pregnancy only  . Gestational diabetes    diet-controlled    Past Surgical History:  Procedure Laterality Date  . BREAST SURGERY  2003   BILATERAL LUMPECTOMY   . DILATION AND EVACUATION N/A 12/12/2015   Procedure: DILATATION AND EVACUATION;  Surgeon: Shelly Bombard, MD;  Location: [facility];  Service: Gynecology;  Laterality: N/A;  . THUMB FUSION      There were no vitals filed for this visit.  Subjective Assessment - 07/11/17 0847    Subjective  "Better" "pain in my knees, a little in the back"    Currently in Pain?  Yes    Pain Score  7     Pain Location  Back    Pain Orientation  Left         [facility] PT Assessment - 07/11/17 0001      AROM   Overall AROM Comments  Lumbar ROM WFL                  [facility] Adult PT Treatment/Exercise - 07/11/17 0001      Lumbar Exercises: Aerobic   Elliptical  I10 R 5 2 min     Tread Mill  Nustep L 4 7 min      Lumbar Exercises: Machines for Strengthening   Cybex Knee Extension  10lb 2x10     Cybex Knee Flexion  35lb 2x10     Leg Press  40lb 3x10    Other Lumbar Machine Exercise  Row & Lats 25lb 2x10       Lumbar Exercises: Standing   Other Standing Lumbar Exercises  Shoulder ext pully 10lb 2x10; Standing marches 2x10 each      Moist Heat Therapy   Number Minutes Moist Heat  15 Minutes    Moist Heat Location  Lumbar Spine      Electrical Stimulation   Electrical Stimulation Location  T/L area    Electrical Stimulation Action  IFC    Electrical Stimulation Parameters  supine     Electrical Stimulation Goals  Pain               PT Short Term Goals - 07/02/17 0856      PT SHORT TERM GOAL #1   Title  independent with initial HEP    Status  Achieved        PT Long Term Goals - 07/11/17 0916      PT LONG TERM GOAL #1  Title  decrease pain 50%    Status  Partially Met      PT LONG TERM GOAL #2   Title  increase lumbar ROM 50%    Status  Achieved      PT LONG TERM GOAL #3   Title  understand proper posture and body mechanics    Status  Achieved      PT LONG TERM GOAL #4   Title  tolerate standing >30 minutes    Status  Achieved            Plan - 07/11/17 0928    Clinical Impression Statement  Progressed with more aerobic activity, pt fatigues quick on elliptical. No issues with days  exercises. Good strength and ROM with all machine level interventions.      Rehab Potential  Good    PT Frequency  2x / week    PT Duration  8 weeks    PT Next Visit Plan  slowly add exercises and flexibility       Patient will benefit from skilled therapeutic intervention in order to improve the following deficits and impairments:  Decreased activity tolerance, Decreased mobility, Decreased strength, Improper body mechanics, Impaired flexibility, Pain, Increased muscle spasms, Decreased range of motion  Visit Diagnosis: Acute bilateral low back pain without sciatica  Muscle spasm of back     Problem List Patient Active Problem List   Diagnosis Date Noted  . SVD (spontaneous vaginal delivery) 04/30/2017  .  Gestational diabetes, diet controlled 04/29/2017  . Gestational diabetes 04/23/2017  . Asthma exacerbation 02/09/2017  . Hyperglycemia 02/09/2017  . Supervision of other high risk pregnancy, antepartum 11/05/2016  . Increased nuchal translucency space on fetal ultrasound 10/29/2016  . AMA (advanced maternal age) multigravida 95+, unspecified trimester 10/08/2016    Scot Jun, PTA 07/11/2017, 9:30 AM  [place], [place], [address] Phone: [phone]   Fax:  [phone]  Name: Cheryl Acosta MRN: [id number] Date of Birth: Oct 29, 1975

## 2017-07-15 ENCOUNTER — Encounter: Payer: Self-pay | Admitting: Physical Therapy

## 2017-07-15 ENCOUNTER — Ambulatory Visit: Payer: 59 | Admitting: Physical Therapy

## 2017-07-19 ENCOUNTER — Ambulatory Visit: Payer: 59 | Admitting: Physical Therapy

## 2017-07-23 ENCOUNTER — Ambulatory Visit: Payer: 59 | Admitting: Physical Therapy

## 2017-07-25 ENCOUNTER — Ambulatory Visit: Payer: 59 | Attending: Obstetrics | Admitting: Physical Therapy

## 2017-07-25 ENCOUNTER — Encounter: Payer: Self-pay | Admitting: Physical Therapy

## 2017-07-25 DIAGNOSIS — M62830 Muscle spasm of back: Secondary | ICD-10-CM | POA: Diagnosis not present

## 2017-07-25 DIAGNOSIS — M545 Low back pain, unspecified: Secondary | ICD-10-CM

## 2017-07-25 NOTE — Therapy (Signed)
[place], [place], [address] Phone: [phone]   Fax:  [phone]  Physical Therapy Treatment  Patient Details  Name: Cheryl Acosta MRN: [id number] Date of Birth: 1976/03/02 Referring Provider: Jodi Mourning   Encounter Date: 07/25/2017  PT End of Session - 07/25/17 0933    Visit Number  8    Date for PT Re-Evaluation  08/11/17    PT Start Time  0850    PT Stop Time  0945    PT Time Calculation (min)  55 min    Activity Tolerance  Patient tolerated treatment well    Behavior During Therapy  [facility] for tasks assessed/performed       Past Medical History:  Diagnosis Date  . Anemia    not on Iron  . Asthma    on Symbicort inhaler; uses prn; last used 04/28/17  . Diabetes mellitus without complication ([place])   . Environmental allergies   . GERD (gastroesophageal reflux disease)    during pregnancy only  . Gestational diabetes    diet-controlled    Past Surgical History:  Procedure Laterality Date  . BREAST SURGERY  2003   BILATERAL LUMPECTOMY   . DILATION AND EVACUATION N/A 12/12/2015   Procedure: DILATATION AND EVACUATION;  Surgeon: Shelly Bombard, MD;  Location: [facility];  Service: Gynecology;  Laterality: N/A;  . THUMB FUSION      There were no vitals filed for this visit.  Subjective Assessment - 07/25/17 0850    Subjective  "So far so good" Pt reports 60 percent improvement     Currently in Pain?  Yes    Pain Score  5     Pain Location  Back                      [facility] Adult PT Treatment/Exercise - 07/25/17 0001      Lumbar Exercises: Aerobic   Elliptical  I8 R 5 2 frd/ 2 rev     Tread Mill  Nustep L 4 6 min      Lumbar Exercises: Machines for Strengthening   Cybex Lumbar Extension  .    Cybex Knee Extension  10lb 3x10     Cybex Knee Flexion  35lb 3x10     Leg Press  40lb 2x15    Other Lumbar Machine Exercise  Row & Lats 25lb 2x10       Lumbar Exercises:  Standing   Other Standing Lumbar Exercises  Shoulder ext pully 10lb 2x10; Standing marches 2x10 each      Lumbar Exercises: Seated   Other Seated Lumbar Exercises  Crunches on physo ball 3 sec x10      Moist Heat Therapy   Number Minutes Moist Heat  15 Minutes    Moist Heat Location  Lumbar Spine      Electrical Stimulation   Electrical Stimulation Location  T/L area    Electrical Stimulation Action  IFC    Electrical Stimulation Parameters  supine    Electrical Stimulation Goals  Pain               PT Short Term Goals - 07/02/17 0856      PT SHORT TERM GOAL #1   Title  independent with initial HEP    Status  Achieved        PT Long Term Goals - 07/11/17 0916      PT LONG TERM GOAL #1  Title  decrease pain 50%    Status  Partially Met      PT LONG TERM GOAL #2   Title  increase lumbar ROM 50%    Status  Achieved      PT LONG TERM GOAL #3   Title  understand proper posture and body mechanics    Status  Achieved      PT LONG TERM GOAL #4   Title  tolerate standing >30 minutes    Status  Achieved            Plan - 07/25/17 0935    Clinical Impression Statement  Pt reports an overall improvement, fatigues quick with aerobic activity. Continues to have good strength on machine level interventions. No reports of increase pain with today's exercises.    Rehab Potential  Good    PT Frequency  2x / week    PT Duration  8 weeks    PT Treatment/Interventions  Electrical Stimulation;Moist Heat;Therapeutic activities;Therapeutic exercise;Patient/family education;Manual techniques    PT Next Visit Plan  exercises and flexibility       Patient will benefit from skilled therapeutic intervention in order to improve the following deficits and impairments:  Decreased activity tolerance, Decreased mobility, Decreased strength, Improper body mechanics, Impaired flexibility, Pain, Increased muscle spasms, Decreased range of motion  Visit Diagnosis: Muscle spasm of  back  Acute bilateral low back pain without sciatica     Problem List Patient Active Problem List   Diagnosis Date Noted  . SVD (spontaneous vaginal delivery) 04/30/2017  . Gestational diabetes, diet controlled 04/29/2017  . Gestational diabetes 04/23/2017  . Asthma exacerbation 02/09/2017  . Hyperglycemia 02/09/2017  . Supervision of other high risk pregnancy, antepartum 11/05/2016  . Increased nuchal translucency space on fetal ultrasound 10/29/2016  . AMA (advanced maternal age) multigravida 34+, unspecified trimester 10/08/2016    Scot Jun, PTA 07/25/2017, 9:36 AM  [place], [place], [address] Phone: [phone]   Fax:  [phone]  Name: Shacarra Choe MRN: [id number] Date of Birth: Jan 08, 1976

## 2017-07-29 ENCOUNTER — Ambulatory Visit: Payer: 59 | Admitting: Physical Therapy

## 2017-08-01 ENCOUNTER — Encounter: Payer: Self-pay | Admitting: Obstetrics

## 2017-08-01 ENCOUNTER — Ambulatory Visit: Payer: 59 | Admitting: Physical Therapy

## 2017-08-01 ENCOUNTER — Encounter: Payer: Self-pay | Admitting: Physical Therapy

## 2017-08-01 DIAGNOSIS — M62830 Muscle spasm of back: Secondary | ICD-10-CM

## 2017-08-01 DIAGNOSIS — M545 Low back pain, unspecified: Secondary | ICD-10-CM

## 2017-08-01 NOTE — Therapy (Signed)
[place], [place], [address] Phone: [phone]   Fax:  [phone]  Physical Therapy Treatment  Patient Details  Name: Cheryl Acosta MRN: [id number] Date of Birth: 11/24/1975 Referring Provider: Jodi Mourning   Encounter Date: 08/01/2017  PT End of Session - 08/01/17 0923    Visit Number  9    Date for PT Re-Evaluation  08/11/17    PT Start Time  0845    PT Stop Time  0939    PT Time Calculation (min)  54 min    Activity Tolerance  Patient tolerated treatment well    Behavior During Therapy  [facility] for tasks assessed/performed       Past Medical History:  Diagnosis Date  . Anemia    not on Iron  . Asthma    on Symbicort inhaler; uses prn; last used 04/28/17  . Diabetes mellitus without complication ([place])   . Environmental allergies   . GERD (gastroesophageal reflux disease)    during pregnancy only  . Gestational diabetes    diet-controlled    Past Surgical History:  Procedure Laterality Date  . BREAST SURGERY  2003   BILATERAL LUMPECTOMY   . DILATION AND EVACUATION N/A 12/12/2015   Procedure: DILATATION AND EVACUATION;  Surgeon: Shelly Bombard, MD;  Location: [facility];  Service: Gynecology;  Laterality: N/A;  . THUMB FUSION      There were no vitals filed for this visit.  Subjective Assessment - 08/01/17 0850    Subjective  "Drowsy"     Currently in Pain?  Yes    Pain Score  6     Pain Location  Back    Pain Orientation  Mid                      [facility] Adult PT Treatment/Exercise - 08/01/17 0001      Lumbar Exercises: Aerobic   Elliptical  I5 R 2 1 frd/1rev    Tread Mill  Nustep L 4 6 min      Lumbar Exercises: Machines for Strengthening   Cybex Knee Extension  13lb 3x10     Other Lumbar Machine Exercise  Row & Lats 25lb 2x15      Lumbar Exercises: Standing   Other Standing Lumbar Exercises  AR presses 25lb x10 each; Straight arm pull downs 20lb 2x15       Lumbar Exercises: Seated   Other Seated Lumbar Exercises  Crunches on physo ball 3 sec x10      Moist Heat Therapy   Number Minutes Moist Heat  15 Minutes    Moist Heat Location  Lumbar Spine      Electrical Stimulation   Electrical Stimulation Location  T/L area    Electrical Stimulation Action  IFC    Electrical Stimulation Parameters  supine    Electrical Stimulation Goals  Pain               PT Short Term Goals - 07/02/17 0856      PT SHORT TERM GOAL #1   Title  independent with initial HEP    Status  Achieved        PT Long Term Goals - 07/11/17 0916      PT LONG TERM GOAL #1   Title  decrease pain 50%    Status  Partially Met      PT LONG TERM GOAL #2   Title  increase lumbar ROM  50%    Status  Achieved      PT LONG TERM GOAL #3   Title  understand proper posture and body mechanics    Status  Achieved      PT LONG TERM GOAL #4   Title  tolerate standing >30 minutes    Status  Achieved            Plan - 08/01/17 0923    Clinical Impression Statement  continues to fatigue quick with aerobic activity, Reports that her pain is coming from her mid back today, instead of lower. All exercises completed well, more focus on core strength.    Rehab Potential  Good    PT Frequency  2x / week    PT Duration  8 weeks    PT Treatment/Interventions  Electrical Stimulation;Moist Heat;Therapeutic activities;Therapeutic exercise;Patient/family education;Manual techniques    PT Next Visit Plan  exercises and flexibility       Patient will benefit from skilled therapeutic intervention in order to improve the following deficits and impairments:  Decreased activity tolerance, Decreased mobility, Decreased strength, Improper body mechanics, Impaired flexibility, Pain, Increased muscle spasms, Decreased range of motion  Visit Diagnosis: Muscle spasm of back  Acute bilateral low back pain without sciatica     Problem List Patient Active Problem List    Diagnosis Date Noted  . SVD (spontaneous vaginal delivery) 04/30/2017  . Gestational diabetes, diet controlled 04/29/2017  . Gestational diabetes 04/23/2017  . Asthma exacerbation 02/09/2017  . Hyperglycemia 02/09/2017  . Supervision of other high risk pregnancy, antepartum 11/05/2016  . Increased nuchal translucency space on fetal ultrasound 10/29/2016  . AMA (advanced maternal age) multigravida 74+, unspecified trimester 10/08/2016    Scot Jun, PTA 08/01/2017, 9:25 AM  [place], [place], [address] Phone: [phone]   Fax:  [phone]  Name: Cheryl Acosta MRN: [id number] Date of Birth: 1976-04-18

## 2017-08-02 ENCOUNTER — Encounter: Payer: Self-pay | Admitting: Physical Therapy

## 2017-08-08 ENCOUNTER — Encounter: Payer: Self-pay | Admitting: Physical Therapy

## 2017-08-08 ENCOUNTER — Ambulatory Visit: Payer: 59 | Admitting: Physical Therapy

## 2017-08-08 DIAGNOSIS — M545 Low back pain, unspecified: Secondary | ICD-10-CM

## 2017-08-08 DIAGNOSIS — M62830 Muscle spasm of back: Secondary | ICD-10-CM

## 2017-08-08 NOTE — Therapy (Signed)
[place], [place], [address] Phone: [phone]   Fax:  [phone]  Physical Therapy Treatment  Patient Details  Name: Cheryl Acosta MRN: [id number] Date of Birth: December 09, 1975 Referring Provider: Jodi Mourning   Encounter Date: 08/08/2017  PT End of Session - 08/08/17 0953    Visit Number  10    Date for PT Re-Evaluation  08/11/17    PT Start Time  0930    PT Stop Time  1000    PT Time Calculation (min)  30 min    Activity Tolerance  Patient tolerated treatment well    Behavior During Therapy  [facility] for tasks assessed/performed       Past Medical History:  Diagnosis Date  . Anemia    not on Iron  . Asthma    on Symbicort inhaler; uses prn; last used 04/28/17  . Diabetes mellitus without complication ([place])   . Environmental allergies   . GERD (gastroesophageal reflux disease)    during pregnancy only  . Gestational diabetes    diet-controlled    Past Surgical History:  Procedure Laterality Date  . BREAST SURGERY  2003   BILATERAL LUMPECTOMY   . DILATION AND EVACUATION N/A 12/12/2015   Procedure: DILATATION AND EVACUATION;  Surgeon: Shelly Bombard, MD;  Location: [facility];  Service: Gynecology;  Laterality: N/A;  . THUMB FUSION      There were no vitals filed for this visit.  Subjective Assessment - 08/08/17 0931    Subjective  "So far, so good" "Im a little stiff I guess its too cold"    Currently in Pain?  Yes    Pain Score  6     Pain Location  Back                      [facility] Adult PT Treatment/Exercise - 08/08/17 0001      Ambulation/Gait   Gait Comments  2 flights of stairs      Lumbar Exercises: Aerobic   Tread Mill  Nustep L 4 6 min      Lumbar Exercises: Machines for Strengthening   Leg Press  40lb 2x15    Other Lumbar Machine Exercise  Row & Lats 35lb 2x10               PT Short Term Goals - 07/02/17 0856      PT SHORT TERM GOAL #1   Title   independent with initial HEP    Status  Achieved        PT Long Term Goals - 08/08/17 0957      PT LONG TERM GOAL #1   Title  decrease pain 50%    Status  Partially Met      PT LONG TERM GOAL #2   Title  increase lumbar ROM 50%    Status  Achieved      PT LONG TERM GOAL #3   Title  understand proper posture and body mechanics    Status  Achieved      PT LONG TERM GOAL #4   Title  tolerate standing >30 minutes    Status  Achieved            Plan - 08/08/17 0953    Clinical Impression Statement  Pt has progressed completing most of her PT goals.  Pt reports no functional limitations. She does however continues to report tolerable back pain. Also during stair  negotiation she reports some bilat knee pain that she attributes to the cold weather.  Pt feel very confident that's she is ready to return to work. Pt with good strength and ROM on all exercises.     Rehab Potential  Good    PT Frequency  2x / week    PT Duration  8 weeks    PT Treatment/Interventions  Electrical Stimulation;Moist Heat;Therapeutic activities;Therapeutic exercise;Patient/family education;Manual techniques    PT Next Visit Plan  D/C pt is pleased with her current functional status. 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       Patient will benefit from skilled therapeutic intervention in order to improve the following deficits and impairments:  Decreased activity tolerance, Decreased mobility, Decreased strength, Improper body mechanics, Impaired flexibility, Pain, Increased muscle spasms, Decreased range of motion  Visit Diagnosis: Acute bilateral low back pain without sciatica  Muscle spasm of back     Problem List Patient Active Problem List   Diagnosis Date Noted  . SVD (spontaneous vaginal delivery) 04/30/2017  . Gestational diabetes, diet controlled 04/29/2017  . Gestational diabetes 04/23/2017  . Asthma exacerbation 02/09/2017  . Hyperglycemia 02/09/2017  . Supervision of other  high risk pregnancy, antepartum 11/05/2016  . Increased nuchal translucency space on fetal ultrasound 10/29/2016  . AMA (advanced maternal age) multigravida 76+, unspecified trimester 10/08/2016    PHYSICAL THERAPY DISCHARGE SUMMARY  Visits from Start of Care: 10  Plan: Patient agrees to discharge.  Patient goals were partially met. Patient is being discharged due to being pleased with the current functional level.  ?????     Scot Jun, PTA 08/08/2017, 9:58 AM  [place], [place], [address] Phone: [phone]   Fax:  [phone]  Name: Cheryl Acosta MRN: [id number] Date of Birth: [birth year]

## 2017-08-28 ENCOUNTER — Emergency Department (HOSPITAL_BASED_OUTPATIENT_CLINIC_OR_DEPARTMENT_OTHER): Payer: Managed Care, Other (non HMO)

## 2017-08-28 ENCOUNTER — Encounter (HOSPITAL_BASED_OUTPATIENT_CLINIC_OR_DEPARTMENT_OTHER): Payer: Self-pay

## 2017-08-28 ENCOUNTER — Other Ambulatory Visit: Payer: Self-pay

## 2017-08-28 ENCOUNTER — Emergency Department (HOSPITAL_BASED_OUTPATIENT_CLINIC_OR_DEPARTMENT_OTHER)
Admission: EM | Admit: 2017-08-28 | Discharge: 2017-08-28 | Disposition: A | Discharge status: Home / Self Care | Payer: Managed Care, Other (non HMO) | Attending: Physician Assistant | Admitting: Physician Assistant

## 2017-08-28 DIAGNOSIS — Y999 Unspecified external cause status: Secondary | ICD-10-CM | POA: Insufficient documentation

## 2017-08-28 DIAGNOSIS — M25511 Pain in right shoulder: Secondary | ICD-10-CM | POA: Diagnosis not present

## 2017-08-28 DIAGNOSIS — J45909 Unspecified asthma, uncomplicated: Secondary | ICD-10-CM | POA: Diagnosis not present

## 2017-08-28 DIAGNOSIS — E119 Type 2 diabetes mellitus without complications: Secondary | ICD-10-CM | POA: Diagnosis not present

## 2017-08-28 DIAGNOSIS — Y92410 Unspecified street and highway as the place of occurrence of the external cause: Secondary | ICD-10-CM | POA: Insufficient documentation

## 2017-08-28 DIAGNOSIS — Y939 Activity, unspecified: Secondary | ICD-10-CM | POA: Insufficient documentation

## 2017-08-28 DIAGNOSIS — S4991XA Unspecified injury of right shoulder and upper arm, initial encounter: Secondary | ICD-10-CM | POA: Diagnosis present

## 2017-08-28 DIAGNOSIS — M542 Cervicalgia: Secondary | ICD-10-CM | POA: Diagnosis not present

## 2017-08-28 MED ORDER — NAPROXEN 375 MG PO TABS: 375.0000 mg | ORAL_TABLET | Freq: Two times a day (BID) | ORAL | 0 refills | Status: DC

## 2017-08-28 MED ORDER — METHOCARBAMOL 500 MG PO TABS: 500.0000 mg | ORAL_TABLET | Freq: Two times a day (BID) | ORAL | 0 refills | Status: DC

## 2017-08-28 MED ORDER — NAPROXEN 250 MG PO TABS
250.0000 mg | ORAL_TABLET | Freq: Once | ORAL | Status: AC
  Administered 2017-08-28: 250 mg via ORAL
  Filled 2017-08-28: qty 1

## 2017-08-28 MED FILL — METHOCARBAMOL 500 MG TABLET: 500 | 10 days supply | Qty: 20 | Fill #0

## 2017-08-28 MED FILL — NAPROXEN 375 MG TABS: 375 | 7 days supply | Qty: 14 | Fill #0

## 2017-08-28 NOTE — ED Notes (Signed)
ED Provider at bedside. 

## 2017-08-28 NOTE — ED Notes (Signed)
NAD at this time. Pt is stable and going home.  
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
## 2017-08-28 NOTE — Discharge Instructions (Signed)
Your x-ray did not show any signs of fracture.  This likely musculoskeletal pain.  Make sure that you follow-up with your primary care doctor. Please rest, ice, compress and elevated the affected body part to help with swelling and pain. Also keep a heating pad on the affected area. Please take the Naproxen as prescribed for pain. Do not take any additional NSAIDs including Motrin, Aleve, Ibuprofen, Advil. Please the the robaxin for muscle relaxation. This medication will make you drowsy so avoid situation that could place you in danger.

## 2017-09-03 ENCOUNTER — Encounter (HOSPITAL_BASED_OUTPATIENT_CLINIC_OR_DEPARTMENT_OTHER): Payer: Self-pay | Admitting: Emergency Medicine

## 2017-09-03 ENCOUNTER — Emergency Department (HOSPITAL_BASED_OUTPATIENT_CLINIC_OR_DEPARTMENT_OTHER): Payer: Managed Care, Other (non HMO)

## 2017-09-03 ENCOUNTER — Other Ambulatory Visit: Payer: Self-pay

## 2017-09-03 ENCOUNTER — Emergency Department (HOSPITAL_BASED_OUTPATIENT_CLINIC_OR_DEPARTMENT_OTHER)
Admission: EM | Admit: 2017-09-03 | Discharge: 2017-09-03 | Disposition: A | Discharge status: Home / Self Care | Payer: Managed Care, Other (non HMO) | Attending: Emergency Medicine | Admitting: Emergency Medicine

## 2017-09-03 DIAGNOSIS — W010XXA Fall on same level from slipping, tripping and stumbling without subsequent striking against object, initial encounter: Secondary | ICD-10-CM | POA: Insufficient documentation

## 2017-09-03 DIAGNOSIS — Y92481 Parking lot as the place of occurrence of the external cause: Secondary | ICD-10-CM | POA: Diagnosis not present

## 2017-09-03 DIAGNOSIS — S6992XA Unspecified injury of left wrist, hand and finger(s), initial encounter: Secondary | ICD-10-CM | POA: Diagnosis present

## 2017-09-03 DIAGNOSIS — J45909 Unspecified asthma, uncomplicated: Secondary | ICD-10-CM | POA: Diagnosis not present

## 2017-09-03 DIAGNOSIS — S60511A Abrasion of right hand, initial encounter: Secondary | ICD-10-CM | POA: Insufficient documentation

## 2017-09-03 DIAGNOSIS — Z79899 Other long term (current) drug therapy: Secondary | ICD-10-CM | POA: Insufficient documentation

## 2017-09-03 DIAGNOSIS — W19XXXA Unspecified fall, initial encounter: Secondary | ICD-10-CM

## 2017-09-03 DIAGNOSIS — S63285A Dislocation of proximal interphalangeal joint of left ring finger, initial encounter: Secondary | ICD-10-CM | POA: Diagnosis not present

## 2017-09-03 DIAGNOSIS — S63275A Dislocation of unspecified interphalangeal joint of left ring finger, initial encounter: Secondary | ICD-10-CM

## 2017-09-03 DIAGNOSIS — Y9389 Activity, other specified: Secondary | ICD-10-CM | POA: Insufficient documentation

## 2017-09-03 DIAGNOSIS — Y998 Other external cause status: Secondary | ICD-10-CM | POA: Diagnosis not present

## 2017-09-03 DIAGNOSIS — E119 Type 2 diabetes mellitus without complications: Secondary | ICD-10-CM | POA: Diagnosis not present

## 2017-09-03 DIAGNOSIS — S60519A Abrasion of unspecified hand, initial encounter: Secondary | ICD-10-CM

## 2017-09-03 DIAGNOSIS — S60419A Abrasion of unspecified finger, initial encounter: Secondary | ICD-10-CM

## 2017-09-03 MED ORDER — IBUPROFEN 800 MG PO TABS: 800.0000 mg | ORAL_TABLET | Freq: Three times a day (TID) | ORAL | 0 refills | Status: DC | PRN

## 2017-09-03 MED ORDER — LIDOCAINE HCL (PF) 1 % IJ SOLN
5.0000 mL | Freq: Once | INTRAMUSCULAR | Status: AC
Start: 2017-09-03 — End: 2017-09-03
  Administered 2017-09-03: 5 mL via INTRADERMAL
  Filled 2017-09-03: qty 5

## 2017-09-03 MED ORDER — IBUPROFEN 800 MG PO TABS
800.0000 mg | ORAL_TABLET | Freq: Once | ORAL | Status: AC
  Administered 2017-09-03: 800 mg via ORAL
  Filled 2017-09-03: qty 1

## 2017-09-03 MED FILL — IBUPROFEN 800 MG TAB: 800 | 7 days supply | Qty: 21 | Fill #0

## 2017-09-03 NOTE — ED Provider Notes (Signed)
[facility] EMERGENCY DEPARTMENT Provider Note   CSN: [id number] Arrival date & time: 09/03/17  7893     History   Chief Complaint Chief Complaint  Patient presents with  . Fall    HPI Cheryl Acosta is a 42 y.o. female.  The history is provided by the patient.  Fall  This is a new problem. The current episode started less than 1 hour ago. The problem occurs constantly. The problem has not changed since onset.Pertinent negatives include no chest pain, no abdominal pain, no headaches and no shortness of breath. The symptoms are aggravated by bending. Nothing relieves the symptoms. She has tried nothing for the symptoms. The treatment provided no relief.   42 year old female with history of asthma states she slept in the parking lot at work and fell to the ground.  She broke her fall with her bilateral hands.  There was no head strike no loss of consciousness.  She is complaining of moderate pain in her left greater than right hands especially the left third and fourth digits.  She has some abrasions over her palms.  There is no foreign body sensation.  She denies any numbness or tingling.  There is no associated chest pain shortness of breath abdominal pain.  Past Medical History:  Diagnosis Date  . Anemia    not on Iron  . Asthma    on Symbicort inhaler; uses prn; last used 04/28/17  . Diabetes mellitus without complication ([place])   . Environmental allergies   . GERD (gastroesophageal reflux disease)    during pregnancy only  . Gestational diabetes    diet-controlled    Patient Active Problem List   Diagnosis Date Noted  . SVD (spontaneous vaginal delivery) 04/30/2017  . Gestational diabetes, diet controlled 04/29/2017  . Gestational diabetes 04/23/2017  . Asthma exacerbation 02/09/2017  . Hyperglycemia 02/09/2017  . Supervision of other high risk pregnancy, antepartum 11/05/2016  . Increased nuchal translucency space on fetal ultrasound 10/29/2016  . AMA  (advanced maternal age) multigravida 25+, unspecified trimester 10/08/2016    Past Surgical History:  Procedure Laterality Date  . BREAST SURGERY  2003   BILATERAL LUMPECTOMY   . DILATION AND EVACUATION N/A 12/12/2015   Procedure: DILATATION AND EVACUATION;  Surgeon: Shelly Bombard, MD;  Location: [facility];  Service: Gynecology;  Laterality: N/A;  . THUMB FUSION      OB History    Gravida Para Term Preterm AB Living   4 3 3  0 1 3   SAB TAB Ectopic Multiple Live Births   0 1 0 0 3       Home Medications    Prior to Admission medications   Medication Sig Start Date End Date Taking? Authorizing Provider  cetirizine (ZYRTEC) 10 MG tablet Take 10 mg by mouth daily as needed for allergies.     [provider]  HYDROcodone-ibuprofen (VICOPROFEN) 7.5-200 MG tablet Take 1 tablet by mouth every 8 (eight) hours as needed for moderate pain. 05/28/17   Shelly Bombard, MD  ibuprofen (ADVIL,MOTRIN) 800 MG tablet Take 1 tablet (800 mg total) by mouth every 8 (eight) hours as needed. 05/28/17   Shelly Bombard, MD  methocarbamol (ROBAXIN) 500 MG tablet Take 1 tablet (500 mg total) by mouth 2 (two) times daily. 08/28/17   Doristine Devoid, PA-C  naproxen (NAPROSYN) 375 MG tablet Take 1 tablet (375 mg total) by mouth 2 (two) times daily. 08/28/17   Doristine Devoid, PA-C  Prenatal Vit-Iron  Carbonyl-FA (VOL-TAB RX) 29-1 MG TABS TAKE ONE TABLET BY MOUTH ONCE DAILY 01/08/17   Shelly Bombard, MD  SYMBICORT 160-4.5 MCG/ACT inhaler Inhale 2 puffs into the lungs 2 (two) times daily. 04/23/17   [provider]    Family History Family History  Problem Relation Age of Onset  . Diabetes Mother     Social History Social History   Tobacco Use  . Smoking status: Never Smoker  . Smokeless tobacco: Never Used  Substance Use Topics  . Alcohol use: No  . Drug use: No     Allergies   Patient has no known allergies.   Review of Systems Review of Systems  Constitutional:  Negative for fever.  HENT: Negative for sore throat.   Respiratory: Negative for shortness of breath.   Cardiovascular: Negative for chest pain.  Gastrointestinal: Negative for abdominal pain.  Genitourinary: Negative for dysuria.  Musculoskeletal: Negative for back pain and neck pain.  Skin: Negative for rash.  Neurological: Negative for dizziness and headaches.     Physical Exam Updated Vital Signs BP 131/74 (BP Location: Right Arm)   Pulse 63   Temp 98.2 F (36.8 C) (Oral)   Resp 18   Ht 5\' 7"  (1.702 m)   Wt 134.7 kg (297 lb)   LMP 08/27/2017   BMI 46.52 kg/m   Physical Exam  Constitutional: She appears well-developed and well-nourished.  HENT:  Head: Normocephalic and atraumatic.  Eyes: Conjunctivae are normal.  Neck: Neck supple.  Pulmonary/Chest: Effort normal and breath sounds normal. No respiratory distress.  Musculoskeletal: She exhibits no deformity.       Right wrist: Normal.       Left wrist: Normal.       Right hand: She exhibits laceration (small abrasion in palm, no active bleeding). She exhibits normal range of motion and no tenderness. Normal sensation noted.       Left hand: She exhibits decreased range of motion (3rd and 4th digits, secondary to pain), tenderness, bony tenderness and laceration (tip of 3rd digit, superficial).       Hands: Neurological: She is alert. GCS eye subscore is 4. GCS verbal subscore is 5. GCS motor subscore is 6.  Skin: Skin is warm and dry.  Psychiatric: She has a normal mood and affect.     ED Treatments / Results  Labs (all labs ordered are listed, but only abnormal results are displayed) Labs Reviewed - No data to display  EKG  EKG Interpretation None       Radiology Dg Hand Complete Left  Result Date: 09/03/2017 CLINICAL DATA:  Generalized left hand pain and tenderness after tripping in the parking lot this morning. Patient reports being unable to move the fingers especially the fourth and fifth finger.  EXAM: LEFT HAND - COMPLETE 3+ VIEW COMPARISON:  None in PACs FINDINGS: The bones of the right hand are subjectively adequately mineralized. There is acute dislocation at the PIP joint of the fourth finger. The middle phalanx is displaced superiorly with respect to the head of the proximal phalanx. There is a fracture through the dorsal aspect of the base of the middle phalanx of the fourth finger. There is soft tissue swelling over the PIP joint of the fourth finger. No fracture or dislocation is observed elsewhere. The metacarpals and carpals are intact. The observed portions of the distal radius and ulna are normal. IMPRESSION: There is an acute fracture dislocation involving the PIP joint of the fourth finger. There is dorsal displacement  of the middle and distal phalanges with respect to the proximal phalanx of the fourth finger. There is a fracture involving the dorsal aspect of the base of the middle phalanx of the fourth finger. Electronically Signed   By: David  Martinique M.D.   On: 09/03/2017 09:47   Dg Finger Ring Left  Result Date: 09/03/2017 CLINICAL DATA:  Postreduction. EXAM: LEFT RING FINGER 2+V COMPARISON:  Earlier the same day FINDINGS: PIP joint dislocation has been reduced in the interval. Fracture fragments are seen from the dorsal and palmar aspects of the base of the middle phalanx. No worrisome lytic or sclerotic osseous abnormality. IMPRESSION: Interval reduction of the fracture dislocation involving the PIP joint of the ring finger. Fracture fragments are seen along both the dorsal and palmar aspect of the middle phalangeal base. Electronically Signed   By: Misty Stanley M.D.   On: 09/03/2017 10:18    Procedures Reduction of dislocation Date/Time: 09/03/2017 9:57 AM Performed by: Hayden Rasmussen, MD Authorized by: Hayden Rasmussen, MD  Consent: Verbal consent obtained. Risks and benefits: risks, benefits and alternatives were discussed Consent given by: patient Patient  understanding: patient states understanding of the procedure being performed Patient consent: the patient's understanding of the procedure matches consent given Procedure consent: procedure consent matches procedure scheduled Relevant documents: relevant documents present and verified Test results: test results available and properly labeled Site marked: the operative site was marked Imaging studies: imaging studies available Patient identity confirmed: verbally with patient Time out: Immediately prior to procedure a "time out" was called to verify the correct patient, procedure, equipment, support staff and site/side marked as required. Preparation: Patient was prepped and draped in the usual sterile fashion. Local anesthesia used: yes Anesthesia: digital block  Anesthesia: Local anesthesia used: yes Local Anesthetic: lidocaine 1% without epinephrine Anesthetic total: 5 mL  Sedation: Patient sedated: no  Comments: Patient has a fracture dislocation of her left fourth PIP.  The area was prepped with alcohol swab and a digital block was performed with 1% lidocaine without epinephrine.  The patient had good anesthesia.  She was reduced.  Follow-up x-ray demonstrates reduction and the fracture fragment is still visible.  Patient will be put in a finger splint and she will need to follow-up with hand.    (including critical care time)  Medications Ordered in ED Medications  ibuprofen (ADVIL,MOTRIN) tablet 800 mg (not administered)     Initial Impression / Assessment and Plan / ED Course  I have reviewed the triage vital signs and the nursing notes.  Pertinent labs & imaging results that were available during my care of the patient were reviewed by me and considered in my medical decision making (see chart for details).       Final Clinical Impressions(s) / ED Diagnoses   Final diagnoses:  Dislocation of interphalangeal joint of left ring finger, initial encounter  Abrasion of  hand and fingers, initial encounter  Fall, initial encounter    ED Discharge Orders    None       Hayden Rasmussen, MD 09/04/17 1210

## 2017-09-03 NOTE — ED Notes (Signed)
ED Provider at bedside. 

## 2017-09-03 NOTE — Discharge Instructions (Signed)
You were evaluated in the emergency department for a fall and bilateral hand injuries.  Dislocation of your left ring finger.  There was also fracture associated with that dislocation.  You were reduced and put in a splint and will need to follow-up with the orthopedic hand surgeon.  He should keep the splint on until you see him within a week.  You should use soap and water to your abrasions.  Ibuprofen or Tylenol for pain.  Please return if you have any problems.

## 2017-09-03 NOTE — ED Notes (Signed)
Patient transported to X-ray 

## 2017-09-03 NOTE — ED Triage Notes (Signed)
Pt fell in the parking lot this morning. Pt c/o abrasions to both palms and swelling to R ring finger. Denies LOC, neck or back pain.

## 2017-12-17 DIAGNOSIS — J4521 Mild intermittent asthma with (acute) exacerbation: Secondary | ICD-10-CM | POA: Insufficient documentation

## 2018-02-18 ENCOUNTER — Ambulatory Visit: Payer: Medicaid Other | Admitting: Obstetrics

## 2018-02-18 ENCOUNTER — Other Ambulatory Visit (HOSPITAL_COMMUNITY)
Admission: RE | Admit: 2018-02-18 | Discharge: 2018-02-18 | Disposition: A | Discharge status: Home / Self Care | Payer: Medicaid Other | Source: Ambulatory Visit | Attending: Obstetrics | Admitting: Obstetrics

## 2018-02-18 ENCOUNTER — Encounter: Payer: Self-pay | Admitting: Obstetrics

## 2018-02-18 VITALS — BP 129/87 | HR 71 | Ht 67.0 in | Wt 301.6 lb

## 2018-02-18 DIAGNOSIS — Z0000 Encounter for general adult medical examination without abnormal findings: Secondary | ICD-10-CM

## 2018-02-18 DIAGNOSIS — Z01419 Encounter for gynecological examination (general) (routine) without abnormal findings: Secondary | ICD-10-CM | POA: Insufficient documentation

## 2018-02-18 DIAGNOSIS — Z6841 Body Mass Index (BMI) 40.0 and over, adult: Secondary | ICD-10-CM

## 2018-02-18 DIAGNOSIS — N898 Other specified noninflammatory disorders of vagina: Secondary | ICD-10-CM

## 2018-02-18 DIAGNOSIS — Z3169 Encounter for other general counseling and advice on procreation: Secondary | ICD-10-CM

## 2018-02-18 DIAGNOSIS — Z1239 Encounter for other screening for malignant neoplasm of breast: Secondary | ICD-10-CM

## 2018-02-18 MED ORDER — VITAFOL ULTRA 29-0.6-0.4-200 MG PO CAPS: 1.0000 | ORAL_CAPSULE | Freq: Every day | ORAL | 11 refills | Status: DC

## 2018-02-18 NOTE — Progress Notes (Signed)
Subjective:        Cheryl Acosta is a 42 y.o. female here for a routine exam.  Current complaints: None.    Personal health questionnaire:  Is patient Ashkenazi Jewish, have a family history of breast and/or ovarian cancer: no Is there a family history of uterine cancer diagnosed at age < 23, gastrointestinal cancer, urinary tract cancer, family member who is a Field seismologist syndrome-associated carrier: no Is the patient overweight and hypertensive, family history of diabetes, personal history of gestational diabetes, preeclampsia or PCOS: no Is patient over 75, have PCOS,  family history of premature CHD under age 62, diabetes, smoke, have hypertension or peripheral artery disease:  no At any time, has a partner hit, kicked or otherwise hurt or frightened you?: no Over the past 2 weeks, have you felt down, depressed or hopeless?: no Over the past 2 weeks, have you felt little interest or pleasure in doing things?:no   Gynecologic History Patient's last menstrual period was 02/14/2018. Contraception: condoms Last Pap: 2018. Results were: normal Last mammogram: ~ 2003. Results were: Abnormal  Obstetric History OB History  Gravida Para Term Preterm AB Living  4 3 3  0 1 3  SAB TAB Ectopic Multiple Live Births  0 1 0 0 3    # Outcome Date GA Lbr Len/2nd Weight Sex Delivery Anes PTL Lv  4 Term 04/30/17 [redacted]w[redacted]d 01:55 / 00:07 8 lb 1.3 oz (3.666 kg) M Vag-Spont EPI  LIV  3 TAB 12/12/15 [redacted]w[redacted]d    TAB  N FD     Birth Comments: "baby had died; MD did D&E"  2 Term 2008/02/11 [redacted]w[redacted]d  7 lb 11 oz (3.487 kg) F Vag-Spont None N LIV  1 Term 05/25/06 [redacted]w[redacted]d  7 lb 2 oz (3.232 kg) F Vag-Spont EPI N LIV    Past Medical History:  Diagnosis Date  . Anemia    not on Iron  . Asthma    on Symbicort inhaler; uses prn; last used 04/28/17  . Diabetes mellitus without complication ([place])   . Environmental allergies   . GERD (gastroesophageal reflux disease)    during pregnancy only  . Gestational diabetes    diet-controlled    Past Surgical History:  Procedure Laterality Date  . BREAST SURGERY  2003   BILATERAL LUMPECTOMY   . DILATION AND EVACUATION N/A 12/12/2015   Procedure: DILATATION AND EVACUATION;  Surgeon: Shelly Bombard, MD;  Location: [facility];  Service: Gynecology;  Laterality: N/A;  . THUMB FUSION       Current Outpatient Medications:  .  Budesonide-Formoterol Fumarate (SYMBICORT IN), Inhale into the lungs., Disp: , Rfl:  .  cetirizine (ZYRTEC) 10 MG tablet, Take 10 mg by mouth daily as needed for allergies. , Disp: , Rfl:  .  ibuprofen (ADVIL,MOTRIN) 800 MG tablet, Take 1 tablet (800 mg total) by mouth every 8 (eight) hours as needed., Disp: 21 tablet, Rfl: 0 .  Prenatal Vit-Iron Carbonyl-FA (VOL-TAB RX) 29-1 MG TABS, TAKE ONE TABLET BY MOUTH ONCE DAILY, Disp: 30 tablet, Rfl: 11 .  HYDROcodone-ibuprofen (VICOPROFEN) 7.5-200 MG tablet, Take 1 tablet by mouth every 8 (eight) hours as needed for moderate pain. (Patient not taking: Reported on 02/18/2018), Disp: 30 tablet, Rfl: 0 .  methocarbamol (ROBAXIN) 500 MG tablet, Take 1 tablet (500 mg total) by mouth 2 (two) times daily. (Patient not taking: Reported on 02/18/2018), Disp: 20 tablet, Rfl: 0 .  naproxen (NAPROSYN) 375 MG tablet, Take 1 tablet (375 mg total) by mouth 2 (  two) times daily. (Patient not taking: Reported on 02/18/2018), Disp: 14 tablet, Rfl: 0 .  Prenat-Fe Poly-Methfol-FA-DHA (VITAFOL ULTRA) 29-0.6-0.4-200 MG CAPS, Take 1 tablet by mouth daily before breakfast., Disp: 30 capsule, Rfl: 11 .  SYMBICORT 160-4.5 MCG/ACT inhaler, Inhale 2 puffs into the lungs 2 (two) times daily., Disp: , Rfl:  No Known Allergies  Social History   Tobacco Use  . Smoking status: Never Smoker  . Smokeless tobacco: Never Used  Substance Use Topics  . Alcohol use: No    Family History  Problem Relation Age of Onset  . Diabetes Mother       Review of Systems  Constitutional: negative for fatigue and weight loss Respiratory: negative for  cough and wheezing Cardiovascular: negative for chest pain, fatigue and palpitations Gastrointestinal: negative for abdominal pain and change in bowel habits Musculoskeletal:negative for myalgias Neurological: negative for gait problems and tremors Behavioral/Psych: negative for abusive relationship, depression Endocrine: negative for temperature intolerance    Genitourinary:negative for abnormal menstrual periods, genital lesions, hot flashes, sexual problems and vaginal discharge Integument/breast: negative for breast lump, breast tenderness, nipple discharge and skin lesion(s)    Objective:       BP 129/87   Pulse 71   Ht 5\' 7"  (1.702 m)   Wt (!) 301 lb 9.6 oz (136.8 kg)   LMP 02/14/2018   BMI 47.24 kg/m  General:   alert  Skin:   no rash or abnormalities  Lungs:   clear to auscultation bilaterally  Heart:   regular rate and rhythm, S1, S2 normal, no murmur, click, rub or gallop  Breasts:   normal without suspicious masses, skin or nipple changes or axillary nodes  Abdomen:  normal findings: no organomegaly, soft, non-tender and no hernia  Pelvis:  External genitalia: normal general appearance Urinary system: urethral meatus normal and bladder without fullness, nontender Vaginal: normal without tenderness, induration or masses Cervix: normal appearance Adnexa: normal bimanual exam Uterus: anteverted and non-tender, normal size   Lab Review Urine pregnancy test Labs reviewed yes Radiologic studies reviewed yes  50% of 20 min visit spent on counseling and coordination of care.   Assessment:     1. Encounter for routine gynecological examination with Papanicolaou smear of cervix Rx: - Cytology - PAP  2. Vaginal discharge Rx: - Cervicovaginal ancillary only  3. Class 3 severe obesity due to excess calories without serious comorbidity with body mass index (BMI) of 45.0 to 49.9 in adult [facility]) - program of caloric reduction, exercise and behavioral modification  recommended  4. Encounter for preconception consultation Rx: - Prenat-Fe Poly-Methfol-FA-DHA (VITAFOL ULTRA) 29-0.6-0.4-200 MG CAPS; Take 1 tablet by mouth daily before breakfast.  Dispense: 30 capsule; Refill: 11  5. Screening breast examination Rx: - MM Digital Screening; Future    Plan:    Education reviewed: calcium supplements, depression evaluation, low fat, low cholesterol diet, safe sex/STD prevention, self breast exams and weight bearing exercise. Contraception: none. Mammogram ordered. Follow up in: 1 year.   Meds ordered this encounter  Medications  . Prenat-Fe Poly-Methfol-FA-DHA (VITAFOL ULTRA) 29-0.6-0.4-200 MG CAPS    Sig: Take 1 tablet by mouth daily before breakfast.    Dispense:  30 capsule    Refill:  11   Orders Placed This Encounter  Procedures  . MM Digital Screening    Standing Status:   Future    Standing Expiration Date:   04/22/2019    Order Specific Question:   Reason for Exam (SYMPTOM  OR DIAGNOSIS REQUIRED)  Answer:   Screening    Order Specific Question:   Is the patient pregnant?    Answer:   No    Order Specific Question:   Preferred imaging location?    Answer:   [facility]     Shelly Bombard MD 02-18-2018

## 2018-02-19 LAB — CERVICOVAGINAL ANCILLARY ONLY
BACTERIAL VAGINITIS: NEGATIVE
Candida vaginitis: NEGATIVE
Chlamydia: NEGATIVE
Neisseria Gonorrhea: NEGATIVE
Trichomonas: NEGATIVE

## 2018-02-20 LAB — CYTOLOGY - PAP
DIAGNOSIS: NEGATIVE
HPV (WINDOPATH): NOT DETECTED

## 2018-03-12 ENCOUNTER — Ambulatory Visit
Admission: RE | Admit: 2018-03-12 | Discharge: 2018-03-12 | Disposition: A | Discharge status: Home / Self Care | Payer: Medicaid Other | Source: Ambulatory Visit | Attending: Obstetrics | Admitting: Obstetrics

## 2018-03-12 DIAGNOSIS — Z1239 Encounter for other screening for malignant neoplasm of breast: Secondary | ICD-10-CM

## 2018-03-13 ENCOUNTER — Other Ambulatory Visit: Payer: Self-pay | Admitting: Obstetrics

## 2018-03-13 DIAGNOSIS — R928 Other abnormal and inconclusive findings on diagnostic imaging of breast: Secondary | ICD-10-CM

## 2018-03-18 ENCOUNTER — Other Ambulatory Visit: Payer: Self-pay | Admitting: Obstetrics

## 2018-03-18 ENCOUNTER — Ambulatory Visit
Admission: RE | Admit: 2018-03-18 | Discharge: 2018-03-18 | Disposition: A | Discharge status: Home / Self Care | Payer: Medicaid Other | Source: Ambulatory Visit | Attending: Obstetrics | Admitting: Obstetrics

## 2018-03-18 DIAGNOSIS — N63 Unspecified lump in unspecified breast: Secondary | ICD-10-CM

## 2018-03-18 DIAGNOSIS — R928 Other abnormal and inconclusive findings on diagnostic imaging of breast: Secondary | ICD-10-CM

## 2018-06-25 ENCOUNTER — Telehealth: Payer: Self-pay

## 2018-06-25 NOTE — Telephone Encounter (Signed)
Patient called and stated that she is about [redacted] weeks pregnant and has been having bleeding on and off for the last few days. Advised pt to be evaluated at [facility]

## 2018-08-13 NOTE — L&D Delivery Note (Signed)
Patient: Cheryl Acosta MRN: [id number]  GBS status: Negative, IAP given: None   Patient is a 43 y.o. now G5P4 s/p NSVD at [redacted]w[redacted]d, who was admitted for IOL for AMA/A1GDM. AROM 0h 64m prior to delivery with pink tinged fluid.    Delivery Note At 7:05 PM a viable female was delivered via Vaginal, Spontaneous (Presentation: direct OP ).  APGAR: 9, 9; weight pending.   Placenta status: spontaneous, intact.  Cord: 3 vessel with the following complications: none.   Anesthesia:  Epidural, 10 cc Lidocaine for repair  Episiotomy: None Lacerations: 2nd degree;Perineal Suture Repair: 3.0 vicryl and 3.0 monocryl  Est. Blood Loss (mL): 110  Head delivered OP.  Shoulder and body delivered in usual fashion. Loose nuchal x1 noted after delivery and reduced. Infant with spontaneous cry, placed on mother's abdomen, dried and bulb suctioned. Cord clamped x 2 after 1-minute delay, and cut by family member. Cord blood drawn. Placenta delivered spontaneously with gentle cord traction. Fundus firm with massage and Pitocin. Perineum inspected and found to have 2nd degree laceration, which was repaired with 3.0 vicryl and 3.0 monocryl with good hemostasis achieved.   Mom to postpartum.  Baby to Couplet care / Skin to Skin.  Melina Schools 01/11/2019, 7:37 PM

## 2018-09-08 ENCOUNTER — Encounter: Payer: Self-pay | Admitting: Obstetrics and Gynecology

## 2018-09-08 ENCOUNTER — Ambulatory Visit (INDEPENDENT_AMBULATORY_CARE_PROVIDER_SITE_OTHER): Payer: BLUE CROSS/BLUE SHIELD | Admitting: Obstetrics and Gynecology

## 2018-09-08 VITALS — BP 122/76 | HR 80 | Wt 321.6 lb

## 2018-09-08 DIAGNOSIS — O99212 Obesity complicating pregnancy, second trimester: Secondary | ICD-10-CM

## 2018-09-08 DIAGNOSIS — N898 Other specified noninflammatory disorders of vagina: Secondary | ICD-10-CM | POA: Diagnosis not present

## 2018-09-08 DIAGNOSIS — O99210 Obesity complicating pregnancy, unspecified trimester: Secondary | ICD-10-CM | POA: Insufficient documentation

## 2018-09-08 DIAGNOSIS — O09299 Supervision of pregnancy with other poor reproductive or obstetric history, unspecified trimester: Secondary | ICD-10-CM

## 2018-09-08 DIAGNOSIS — O09522 Supervision of elderly multigravida, second trimester: Secondary | ICD-10-CM

## 2018-09-08 DIAGNOSIS — N760 Acute vaginitis: Secondary | ICD-10-CM | POA: Diagnosis not present

## 2018-09-08 DIAGNOSIS — B9689 Other specified bacterial agents as the cause of diseases classified elsewhere: Secondary | ICD-10-CM | POA: Diagnosis not present

## 2018-09-08 DIAGNOSIS — D573 Sickle-cell trait: Secondary | ICD-10-CM

## 2018-09-08 DIAGNOSIS — J4520 Mild intermittent asthma, uncomplicated: Secondary | ICD-10-CM

## 2018-09-08 DIAGNOSIS — O09529 Supervision of elderly multigravida, unspecified trimester: Secondary | ICD-10-CM | POA: Diagnosis not present

## 2018-09-08 DIAGNOSIS — J45909 Unspecified asthma, uncomplicated: Secondary | ICD-10-CM | POA: Insufficient documentation

## 2018-09-08 DIAGNOSIS — Z3A18 18 weeks gestation of pregnancy: Secondary | ICD-10-CM

## 2018-09-08 DIAGNOSIS — Z8632 Personal history of gestational diabetes: Secondary | ICD-10-CM

## 2018-09-08 DIAGNOSIS — O09292 Supervision of pregnancy with other poor reproductive or obstetric history, second trimester: Secondary | ICD-10-CM

## 2018-09-08 DIAGNOSIS — N63 Unspecified lump in unspecified breast: Secondary | ICD-10-CM

## 2018-09-08 MED ORDER — ASPIRIN EC 81 MG PO TBEC: 81.0000 mg | DELAYED_RELEASE_TABLET | Freq: Every day | ORAL | 2 refills | Status: DC

## 2018-09-08 NOTE — Progress Notes (Addendum)
INITIAL PRENATAL VISIT NOTE  Subjective:  Cheryl Acosta is a 43 y.o. [id number] at 32w3dby unsure LMP being seen today for her initial prenatal visit. This is an unplanned pregnancy. She and partner are happy with the pregnancy. She was using nothing for birth control previously. She has an obstetric history significant for 3 x SVD. She has a medical history significant for gestational diabetes and asthma. Uses flovent for maintenance inhaler BID, uses albuterol inhaler prn, uses less than once per week. She is concerned she may have twins because she "got so big so quick."  Patient reports no complaints.  Contractions: Not present. Vag. Bleeding: None.  Movement: Absent. Denies leaking of fluid.   Past Medical History:  Diagnosis Date  . Anemia    not on Iron  . Asthma    on Symbicort inhaler; uses prn; last used 04/28/17  . Diabetes mellitus without complication ([place]   . Environmental allergies   . GERD (gastroesophageal reflux disease)    during pregnancy only  . Gestational diabetes    diet-controlled   Past Surgical History:  Procedure Laterality Date  . BREAST SURGERY  2003   BILATERAL LUMPECTOMY   . DILATION AND EVACUATION N/A 12/12/2015   Procedure: DILATATION AND EVACUATION;  Surgeon: CShelly Bombard MD;  [facility];  Service: Gynecology;  Laterality: N/A;  . THUMB FUSION     OB History  Gravida Para Term Preterm AB Living  '5 3 3 '$ 0 1 3  SAB TAB Ectopic Multiple Live Births  0 1 0 0 3    # Outcome Date GA Lbr Len/2nd Weight Sex Delivery Anes PTL Lv  5 Current           4 Term 04/30/17 320w1d1:55 / 00:07 8 lb 1.3 oz (3.666 kg) M Vag-Spont EPI  LIV  3 TAB 12/12/15 1121w3d TAB  N FD     Birth Comments: "baby had died; MD did D&E"  2 Term 01/18/11/2009w58w0dlb 11 oz (3.487 kg) F Vag-Spont None N LIV  1 Term 05/25/06 40w014w0db 2 oz (3.232 kg) F Vag-Spont EPI N LIV   Social History   Socioeconomic History  . Marital status: Married    Spouse name: Not on file   . Number of children: Not on file  . Years of education: Not on file  . Highest education level: Not on file  Occupational History  . Not on file  Social Needs  . Financial resource strain: Not on file  . Food insecurity:    Worry: Not on file    Inability: Not on file  . Transportation needs:    Medical: Not on file    Non-medical: Not on file  Tobacco Use  . Smoking status: Never Smoker  . Smokeless tobacco: Never Used  Substance and Sexual Activity  . Alcohol use: No  . Drug use: No  . Sexual activity: Yes    Birth control/protection: None, Condom  Lifestyle  . Physical activity:    Days per week: Not on file    Minutes per session: Not on file  . Stress: Not on file  Relationships  . Social connections:    Talks on phone: Not on file    Gets together: Not on file    Attends religious service: Not on file    Active member of club or organization: Not on file    Attends meetings of clubs or organizations: Not  on file    Relationship status: Not on file  Other Topics Concern  . Not on file  Social History Narrative  . Not on file   Family History  Problem Relation Age of Onset  . Diabetes Mother     Current Outpatient Medications:  .  Budesonide-Formoterol Fumarate (SYMBICORT IN), Inhale into the lungs., Disp: , Rfl:  .  cetirizine (ZYRTEC) 10 MG tablet, Take 10 mg by mouth daily as needed for allergies. , Disp: , Rfl:  .  Prenat-Fe Poly-Methfol-FA-DHA (VITAFOL ULTRA) 29-0.6-0.4-200 MG CAPS, Take 1 tablet by mouth daily before breakfast., Disp: 30 capsule, Rfl: 11 .  Prenatal Vit-Iron Carbonyl-FA (VOL-TAB RX) 29-1 MG TABS, TAKE ONE TABLET BY MOUTH ONCE DAILY, Disp: 30 tablet, Rfl: 11 .  SYMBICORT 160-4.5 MCG/ACT inhaler, Inhale 2 puffs into the lungs 2 (two) times daily., Disp: , Rfl:  .  aspirin EC 81 MG tablet, Take 1 tablet (81 mg total) by mouth daily. Take after 12 weeks for prevention of preeclampsia later in pregnancy, Disp: 300 tablet, Rfl: 2  No Known  Allergies  Review of Systems: Negative except for what is mentioned in HPI.  Objective:   Vitals:   09/08/18 1109  BP: 122/76  Pulse: 80  Weight: (!) 321 lb 9.6 oz (145.9 kg)    Fetal Status:     Movement: Absent     Physical Exam: BP 122/76   Pulse 80   Wt (!) 321 lb 9.6 oz (145.9 kg)   LMP 05/02/2018   BMI 50.37 kg/m  CONSTITUTIONAL: Well-developed, well-nourished female in no acute distress.  NEUROLOGIC: Alert and oriented to person, place, and time. Normal reflexes, muscle tone coordination. No cranial nerve deficit noted. PSYCHIATRIC: Normal mood and affect. Normal behavior. Normal judgment and thought content. SKIN: Skin is warm and dry. No rash noted. Not diaphoretic. No erythema. No pallor. HENT:  Normocephalic, atraumatic, External right and left ear normal. Oropharynx is clear and moist EYES: Conjunctivae and EOM are normal. Pupils are equal, round, and reactive to light. No scleral icterus.  NECK: Normal range of motion, supple, no masses CARDIOVASCULAR: Normal heart rate noted, regular rhythm RESPIRATORY: Effort and breath sounds normal, no problems with respiration noted BREASTS: symmetric, non-tender, no masses palpable ABDOMEN: Soft, nontender, nondistended, gravid. GU: normal appearing external female genitalia, multiparous normal appearing cervix, scant white discharge in vagina, no lesions noted Bimanual: 20 weeks sized uterus, no adnexal tenderness or palpable lesions noted MUSCULOSKELETAL: Normal range of motion. EXT:  No edema and no tenderness. 2+ distal pulses.  Pt informed that the ultrasound is considered a limited OB ultrasound and is not intended to be a complete ultrasound exam.  Patient also informed that the ultrasound is not being completed with the intent of assessing for fetal or placental anomalies or any pelvic abnormalities.  Explained that the purpose of today's ultrasound is to assess for  number of fetuses.  Patient acknowledges the purpose  of the exam and the limitations of the study.    Bedside TAUS: one fetus seen approx 18-20 weeks with cardiac activity noted  Assessment and Plan:  Pregnancy: [id number] at 28w3dby unsure LMP  1. Encounter for supervision of high risk multigravida of advanced maternal age, antepartum - Obstetric Panel, Including HIV - Genetic Screening - [facility] AMB BABYSCRIPTS OPT IN - SMN1 COPY NUMBER ANALYSIS (SMA Carrier Screen) - Cystic Fibrosis Mutation 97 - Cervicovaginal ancillary only( [place]) - [place] OB DETAIL +14 WK; Future  2. Antepartum multigravida  of advanced maternal age Will need testing starting 36 weeks  3. H/O gestational diabetes in prior pregnancy, currently pregnant A1C today  4. Obesity affecting pregnancy, antepartum - Comp Met (CMET) - Protein / creatinine ratio, urine - Hemoglobin A1c - baby ASA sent to pharmacy  5. Sickle cell trait ([facility]) - Culture, OB Urine  6. Mild intermittent asthma without complication Well controlled on flovent Uses albuterol inhaler prn < 1/week  7. Breast mass Noted on mammogram 2019, has f/u US 09/2018   Preterm labor symptoms and general obstetric precautions including but not limited to vaginal bleeding, contractions, leaking of fluid and fetal movement were reviewed in detail with the patient.  Please refer to After Visit Summary for other counseling recommendations.   Return in about 4 weeks (around 10/06/2018) for OB visit (MD).  Sloan Leiter 09/08/2018 2:51 PM

## 2018-09-08 NOTE — Progress Notes (Signed)
Pt presents for NOB visit. LMP is aprx., she is not exactly sure.

## 2018-09-08 NOTE — Addendum Note (Signed)
Addended by: Vivien Rota on: 09/08/2018 02:51 PM   Modules accepted: Orders

## 2018-09-09 ENCOUNTER — Encounter (HOSPITAL_COMMUNITY): Payer: Self-pay

## 2018-09-09 LAB — CERVICOVAGINAL ANCILLARY ONLY
Bacterial vaginitis: POSITIVE — AB
CANDIDA VAGINITIS: NEGATIVE
Chlamydia: NEGATIVE
Neisseria Gonorrhea: NEGATIVE
Trichomonas: NEGATIVE

## 2018-09-09 LAB — PROTEIN / CREATININE RATIO, URINE
CREATININE, UR: 195.2 mg/dL
PROTEIN UR: 12.9 mg/dL
Protein/Creat Ratio: 66 mg/g creat (ref 0–200)

## 2018-09-10 MED ORDER — METRONIDAZOLE 500 MG PO TABS: 500.0000 mg | ORAL_TABLET | Freq: Two times a day (BID) | ORAL | 0 refills | Status: DC

## 2018-09-10 NOTE — Addendum Note (Signed)
Addended by: Vivien Rota on: 09/10/2018 01:12 PM   Modules accepted: Orders

## 2018-09-11 LAB — URINE CULTURE, OB REFLEX

## 2018-09-11 LAB — CULTURE, OB URINE

## 2018-09-16 ENCOUNTER — Encounter (HOSPITAL_COMMUNITY): Payer: Self-pay

## 2018-09-16 ENCOUNTER — Ambulatory Visit (HOSPITAL_COMMUNITY)
Admission: RE | Admit: 2018-09-16 | Discharge: 2018-09-16 | Disposition: A | Discharge status: Home / Self Care | Payer: BLUE CROSS/BLUE SHIELD | Source: Ambulatory Visit | Attending: Obstetrics and Gynecology | Admitting: Obstetrics and Gynecology

## 2018-09-16 DIAGNOSIS — O99212 Obesity complicating pregnancy, second trimester: Secondary | ICD-10-CM

## 2018-09-16 DIAGNOSIS — O09522 Supervision of elderly multigravida, second trimester: Secondary | ICD-10-CM

## 2018-09-16 DIAGNOSIS — Z363 Encounter for antenatal screening for malformations: Secondary | ICD-10-CM

## 2018-09-16 DIAGNOSIS — O09292 Supervision of pregnancy with other poor reproductive or obstetric history, second trimester: Secondary | ICD-10-CM | POA: Diagnosis not present

## 2018-09-16 DIAGNOSIS — Z3687 Encounter for antenatal screening for uncertain dates: Secondary | ICD-10-CM

## 2018-09-16 DIAGNOSIS — O09529 Supervision of elderly multigravida, unspecified trimester: Secondary | ICD-10-CM | POA: Diagnosis not present

## 2018-09-17 ENCOUNTER — Other Ambulatory Visit (HOSPITAL_COMMUNITY): Payer: Self-pay | Admitting: *Deleted

## 2018-09-17 DIAGNOSIS — Z362 Encounter for other antenatal screening follow-up: Secondary | ICD-10-CM

## 2018-09-17 LAB — OBSTETRIC PANEL, INCLUDING HIV
Antibody Screen: NEGATIVE
BASOS ABS: 0 10*3/uL (ref 0.0–0.2)
Basos: 0 %
EOS (ABSOLUTE): 0.1 10*3/uL (ref 0.0–0.4)
Eos: 2 %
HEP B S AG: NEGATIVE
HIV Screen 4th Generation wRfx: NONREACTIVE
Hematocrit: 40.1 % (ref 34.0–46.6)
Hemoglobin: 12.9 g/dL (ref 11.1–15.9)
IMMATURE GRANULOCYTES: 0 %
Immature Grans (Abs): 0 10*3/uL (ref 0.0–0.1)
Lymphocytes Absolute: 1.6 10*3/uL (ref 0.7–3.1)
Lymphs: 23 %
MCH: 22.6 pg — ABNORMAL LOW (ref 26.6–33.0)
MCHC: 32.2 g/dL (ref 31.5–35.7)
MCV: 70 fL — ABNORMAL LOW (ref 79–97)
Monocytes Absolute: 0.4 10*3/uL (ref 0.1–0.9)
Monocytes: 6 %
NEUTROS PCT: 69 %
Neutrophils Absolute: 4.9 10*3/uL (ref 1.4–7.0)
PLATELETS: 230 10*3/uL (ref 150–450)
RBC: 5.71 x10E6/uL — ABNORMAL HIGH (ref 3.77–5.28)
RDW: 15.2 % (ref 11.7–15.4)
RPR: NONREACTIVE
RUBELLA: 5.1 {index} (ref >0.99)
Rh Factor: POSITIVE
WBC: 7 10*3/uL (ref 3.4–10.8)

## 2018-09-17 LAB — SMN1 COPY NUMBER ANALYSIS (SMA CARRIER SCREENING)

## 2018-09-17 LAB — COMPREHENSIVE METABOLIC PANEL
ALT: 6 IU/L (ref 0–32)
AST: 6 IU/L (ref 0–40)
Albumin/Globulin Ratio: 1.5 (ref 1.2–2.2)
Albumin: 3.8 g/dL (ref 3.8–4.8)
Alkaline Phosphatase: 50 IU/L (ref 39–117)
BUN/Creatinine Ratio: 11 (ref 9–23)
BUN: 7 mg/dL (ref 6–24)
Bilirubin Total: <0.2 mg/dL (ref 0.0–1.2)
CO2: 20 mmol/L (ref 20–29)
Calcium: 9.3 mg/dL (ref 8.7–10.2)
Chloride: 102 mmol/L (ref 96–106)
Creatinine, Ser: 0.64 mg/dL (ref 0.57–1.00)
GFR calc Af Amer: 129 mL/min/{1.73_m2} (ref >59)
GFR calc non Af Amer: 112 mL/min/{1.73_m2} (ref >59)
Globulin, Total: 2.6 g/dL (ref 1.5–4.5)
Glucose: 92 mg/dL (ref 65–99)
Potassium: 4.2 mmol/L (ref 3.5–5.2)
Sodium: 136 mmol/L (ref 134–144)
Total Protein: 6.4 g/dL (ref 6.0–8.5)

## 2018-09-17 LAB — CYSTIC FIBROSIS MUTATION 97: Interpretation: NOT DETECTED

## 2018-09-17 LAB — HEMOGLOBIN A1C
ESTIMATED AVERAGE GLUCOSE: 120 mg/dL
Hgb A1c MFr Bld: 5.8 % — ABNORMAL HIGH (ref 4.8–5.6)

## 2018-09-19 ENCOUNTER — Ambulatory Visit
Admission: RE | Admit: 2018-09-19 | Discharge: 2018-09-19 | Disposition: A | Discharge status: Home / Self Care | Payer: BLUE CROSS/BLUE SHIELD | Source: Ambulatory Visit | Attending: Obstetrics | Admitting: Obstetrics

## 2018-09-19 ENCOUNTER — Other Ambulatory Visit: Payer: Self-pay | Admitting: Obstetrics

## 2018-09-19 DIAGNOSIS — N63 Unspecified lump in unspecified breast: Secondary | ICD-10-CM

## 2018-09-19 IMAGING — MG DIGITAL SCREENING BILATERAL MAMMOGRAM WITH TOMO AND CAD
6 of 10 series · 6 of 30 positions shown · non-contrast
Comparison: None.

CLINICAL DATA: Screening.

EXAM:
DIGITAL SCREENING BILATERAL MAMMOGRAM WITH TOMO AND CAD

[L MLO synth-2D]
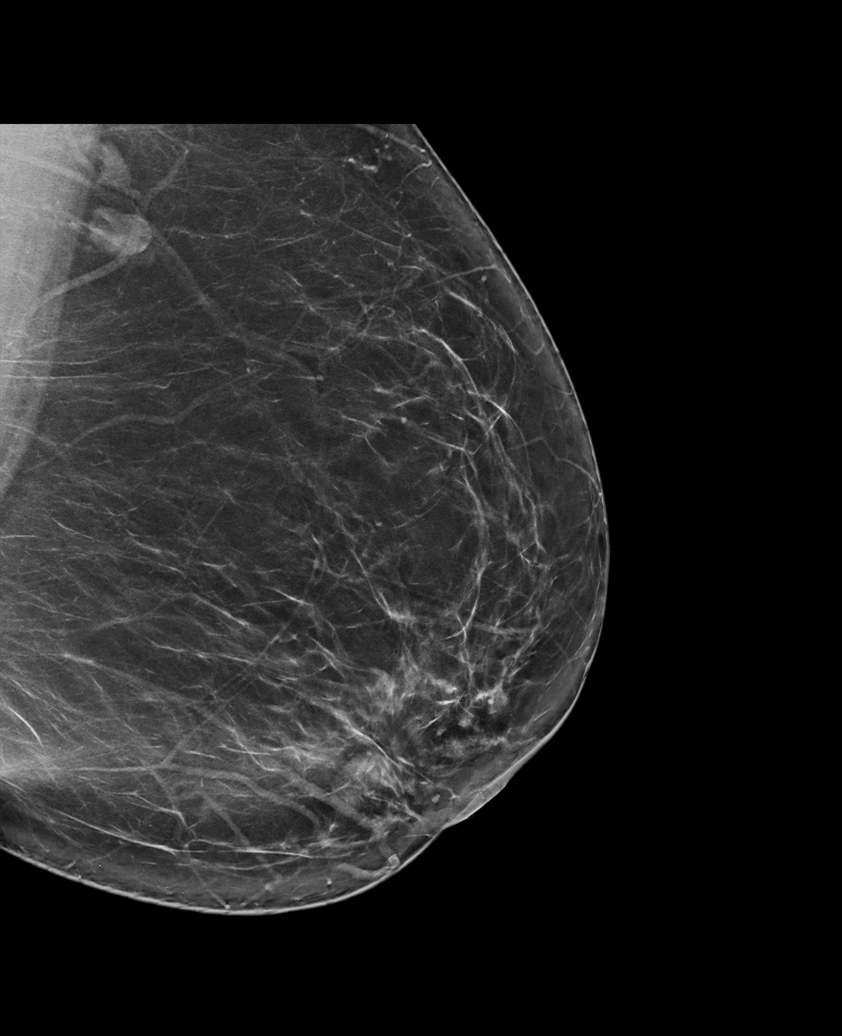

[R CC synth-2D]
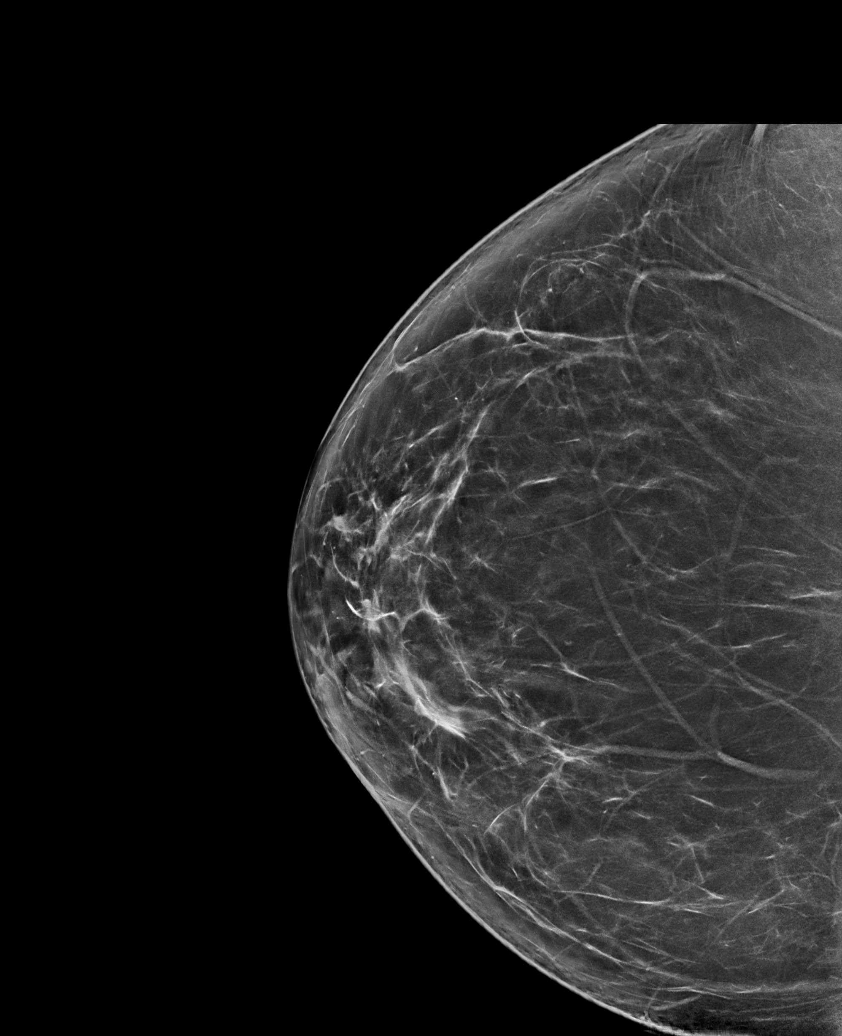

[R CV synth-2D]
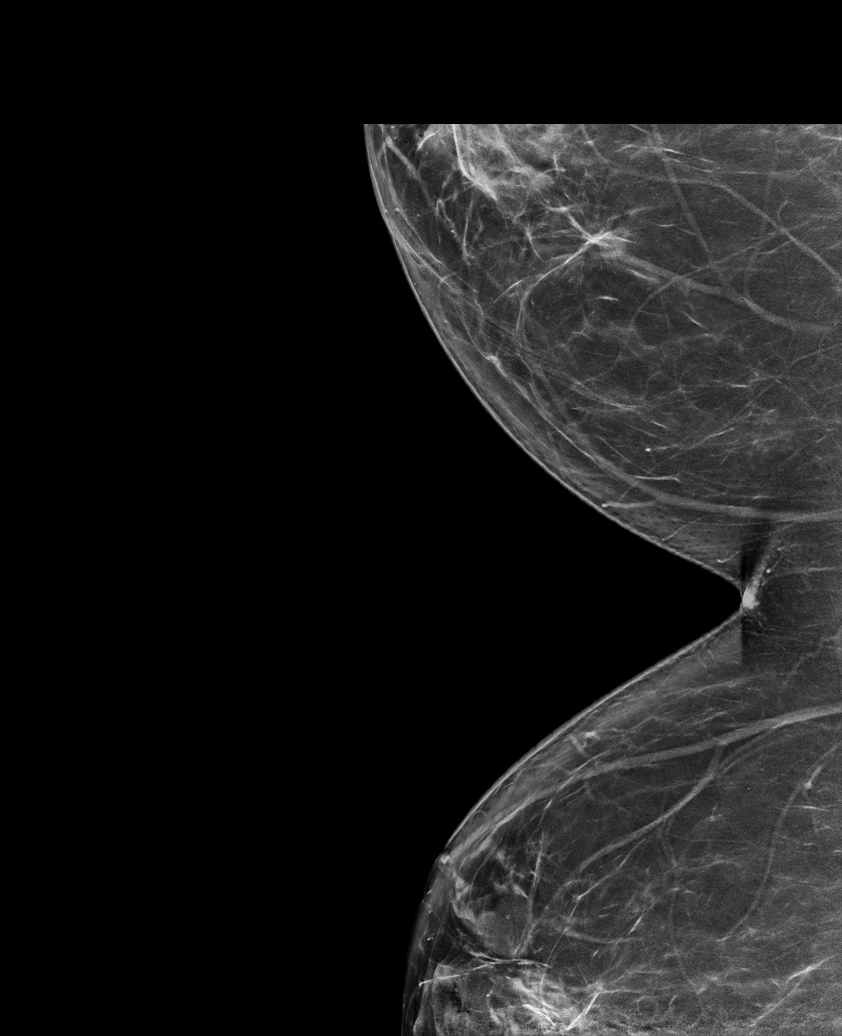

[R MLO synth-2D]
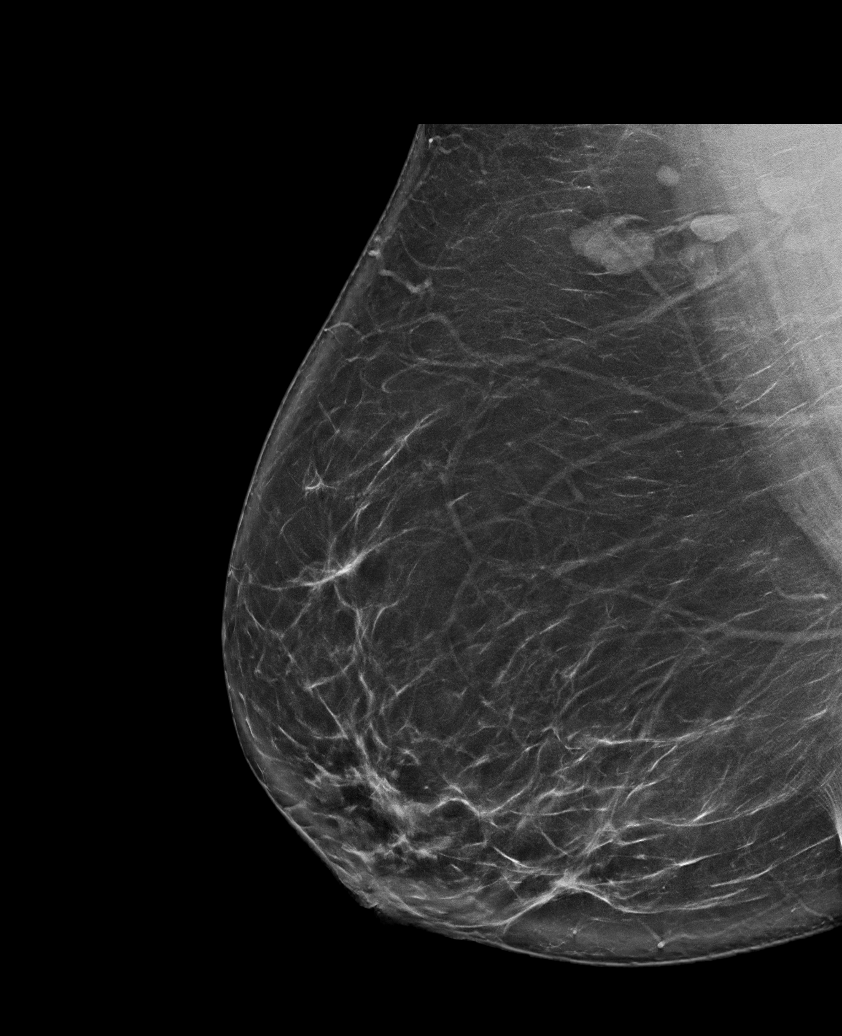

[L CC synth-2D]
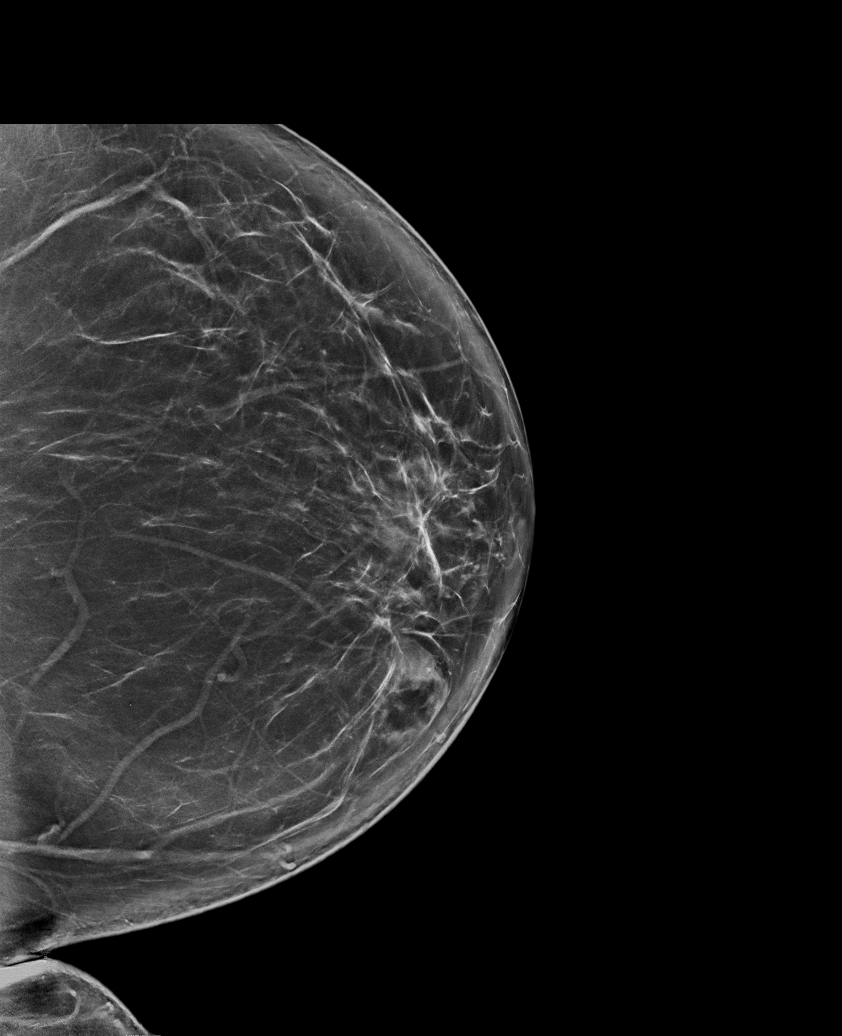

[R MLO tomo · tomo slice 50/99.0]
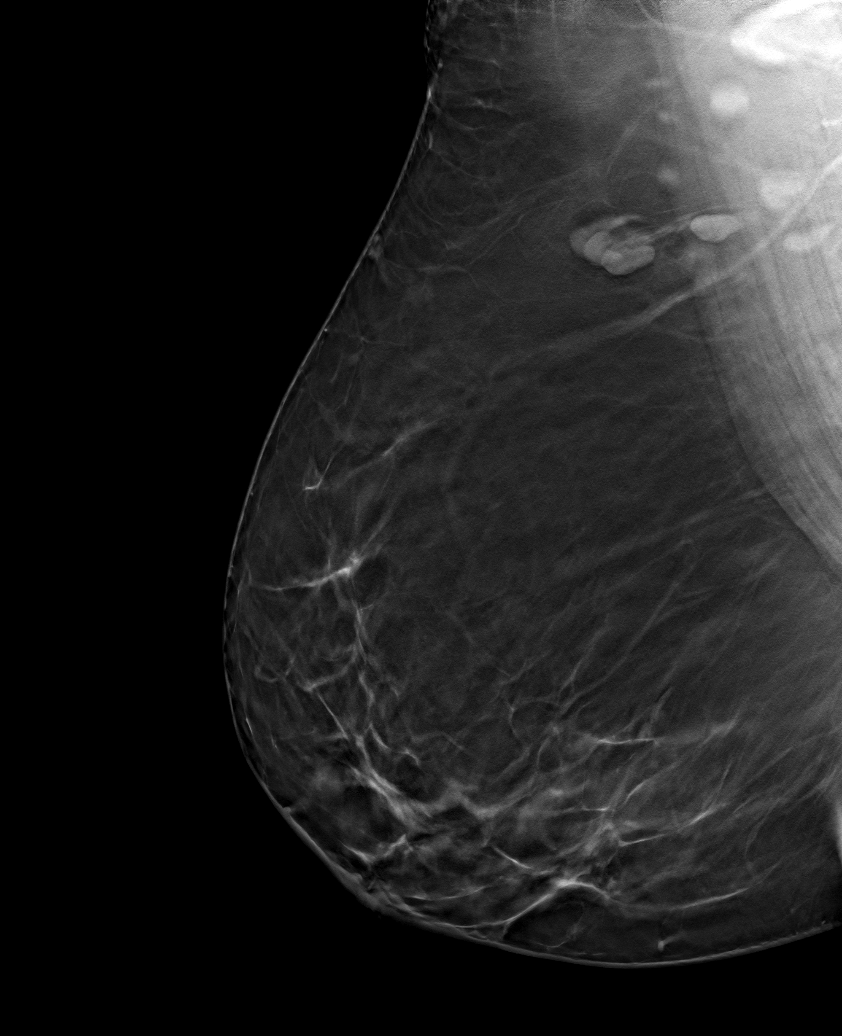

[6 of 30 positions shown; findings below may reference images not displayed]

Baseline.

ACR Breast Density Category a: The breast tissue is almost entirely
fatty.
FINDINGS: In the left breast, a possible mass warrants further evaluation. In
the right breast, no findings suspicious for malignancy. Images were
processed with CAD.
IMPRESSION: Further evaluation is suggested for possible mass in the left
breast.

RECOMMENDATION:
Ultrasound of the left breast. (Code:[id number])

The patient will be contacted regarding the findings, and additional
imaging will be scheduled.

BI-RADS CATEGORY  0: Incomplete. Need additional imaging evaluation
and/or prior mammograms for comparison.

## 2018-09-24 ENCOUNTER — Other Ambulatory Visit: Payer: Self-pay | Admitting: Obstetrics

## 2018-09-24 DIAGNOSIS — N63 Unspecified lump in unspecified breast: Secondary | ICD-10-CM

## 2018-09-25 ENCOUNTER — Other Ambulatory Visit: Payer: BLUE CROSS/BLUE SHIELD

## 2018-09-25 DIAGNOSIS — O09529 Supervision of elderly multigravida, unspecified trimester: Secondary | ICD-10-CM

## 2018-09-26 LAB — GLUCOSE TOLERANCE, 2 HOURS W/ 1HR
Glucose, 1 hour: 163 mg/dL (ref 65–179)
Glucose, 2 hour: 154 mg/dL — ABNORMAL HIGH (ref 65–152)
Glucose, Fasting: 83 mg/dL (ref 65–91)

## 2018-09-29 ENCOUNTER — Encounter: Payer: Self-pay | Admitting: Obstetrics and Gynecology

## 2018-09-29 DIAGNOSIS — O24419 Gestational diabetes mellitus in pregnancy, unspecified control: Secondary | ICD-10-CM | POA: Insufficient documentation

## 2018-10-01 ENCOUNTER — Other Ambulatory Visit: Payer: Self-pay

## 2018-10-01 MED ORDER — BLOOD GLUCOSE METER KIT: PACK | 0 refills | Status: DC

## 2018-10-01 MED ORDER — GLUCOSE BLOOD VI STRP: ORAL_STRIP | 12 refills | Status: DC

## 2018-10-01 MED ORDER — ACCU-CHEK FASTCLIX LANCETS MISC: 1.0000 | Freq: Four times a day (QID) | 12 refills | Status: DC

## 2018-10-01 NOTE — Progress Notes (Signed)
Patient's insurance does not cover the test strips to match the meter she has from the previous pregnancy. Pt aware we sent in matching meter, test strips, and lancets prescriptions that may be covered by her insurance.  Pt will c/b with any issues.

## 2018-10-09 ENCOUNTER — Ambulatory Visit (INDEPENDENT_AMBULATORY_CARE_PROVIDER_SITE_OTHER): Payer: BLUE CROSS/BLUE SHIELD | Admitting: Obstetrics and Gynecology

## 2018-10-09 ENCOUNTER — Encounter: Payer: Self-pay | Admitting: Obstetrics and Gynecology

## 2018-10-09 VITALS — BP 105/64 | HR 94 | Wt 319.8 lb

## 2018-10-09 DIAGNOSIS — Z8632 Personal history of gestational diabetes: Secondary | ICD-10-CM

## 2018-10-09 DIAGNOSIS — D573 Sickle-cell trait: Secondary | ICD-10-CM

## 2018-10-09 DIAGNOSIS — O09299 Supervision of pregnancy with other poor reproductive or obstetric history, unspecified trimester: Secondary | ICD-10-CM

## 2018-10-09 DIAGNOSIS — Z3A26 26 weeks gestation of pregnancy: Secondary | ICD-10-CM

## 2018-10-09 DIAGNOSIS — O09292 Supervision of pregnancy with other poor reproductive or obstetric history, second trimester: Secondary | ICD-10-CM

## 2018-10-09 DIAGNOSIS — R12 Heartburn: Secondary | ICD-10-CM

## 2018-10-09 DIAGNOSIS — O09529 Supervision of elderly multigravida, unspecified trimester: Secondary | ICD-10-CM

## 2018-10-09 DIAGNOSIS — O09522 Supervision of elderly multigravida, second trimester: Secondary | ICD-10-CM

## 2018-10-09 DIAGNOSIS — O99212 Obesity complicating pregnancy, second trimester: Secondary | ICD-10-CM

## 2018-10-09 DIAGNOSIS — O24410 Gestational diabetes mellitus in pregnancy, diet controlled: Secondary | ICD-10-CM

## 2018-10-09 DIAGNOSIS — N63 Unspecified lump in unspecified breast: Secondary | ICD-10-CM

## 2018-10-09 NOTE — Progress Notes (Signed)
   PRENATAL VISIT NOTE  Subjective:  Cheryl Acosta is a 43 y.o. L2X5170 at [redacted]w[redacted]d being seen today for ongoing prenatal care.  She is currently monitored for the following issues for this high-risk pregnancy and has AMA (advanced maternal age) multigravida 96+; Asthma exacerbation; Hyperglycemia; Encounter for supervision of high risk multigravida of advanced maternal age, antepartum; H/O gestational diabetes in prior pregnancy, currently pregnant; Obesity affecting pregnancy; Sickle cell trait ([place]); Asthma; Breast mass; and Gestational diabetes mellitus (GDM) on their problem list.  Patient reports heartburn and constipation. Reports some heartburn that is improved with tums. Some constipation that improves when she stops eating spicy foods.  Contractions: Not present. Vag. Bleeding: None.  Movement: Present. Denies leaking of fluid.   The following portions of the patient's history were reviewed and updated as appropriate: allergies, current medications, past family history, past medical history, past social history, past surgical history and problem list. Problem list updated.  Objective:   Vitals:   10/09/18 1013  BP: 105/64  Pulse: 94  Weight: (!) 319 lb 12.8 oz (145.1 kg)   Fetal Status: Fetal Heart Rate (bpm): 142    Movement: Present    General:  Alert, oriented and cooperative. Patient is in no acute distress.  Skin: Skin is warm and dry. No rash noted.   Cardiovascular: Normal heart rate noted  Respiratory: Normal respiratory effort, no problems with respiration noted  Abdomen: Soft, gravid, appropriate for gestational age.  Pain/Pressure: Absent     Pelvic: Cervical exam deferred        Extremities: Normal range of motion.  Edema: None  Mental Status: Normal mood and affect. Normal behavior. Normal judgment and thought content.   Assessment and Plan:  Pregnancy: [id number] at [redacted]w[redacted]d  1. Encounter for supervision of high risk multigravida of advanced maternal age, antepartum -  dating changed to EDD 01/12/19 by 23 week [place]  2. Obesity affecting pregnancy in second trimester Cont baby ASA  3. Breast mass F/u 09/19/18, stable mass, plans for repeat US 6 months  4. Sickle cell trait ([place])  5. H/O gestational diabetes in prior pregnancy, currently pregnant  6. Multigravida of advanced maternal age in second trimester Needs testing starting 36 weeks  7. Diet controlled gestational diabetes mellitus (GDM) in second trimester - Patient declined N&D referral, had GDM in prior pregnancy - reports she has been checking CBG regularly and following the diet - FG: 89 this am, does not have log, reports highest has been 90 - PP: reports highest was 120 - cont diet control - enroll in baby scripts to keep track of CBG  Preterm labor symptoms and general obstetric precautions including but not limited to vaginal bleeding, contractions, leaking of fluid and fetal movement were reviewed in detail with the patient. Please refer to After Visit Summary for other counseling recommendations.  Return in about 2 weeks (around 10/23/2018) for OB visit (MD).  Future Appointments  Date Time Provider [place]  10/14/2018  9:30 AM WH-MFC [place] 5 WH-MFCUS MFC-US  10/23/2018 11:00 AM Sloan Leiter, MD [facility] None  03/24/2019  9:00 AM GI-BCG DIAG TOMO 1 GI-BCGMM GI-BREAST CE  03/24/2019  9:10 AM GI-BCG [place] 1 GI-BCGUS GI-BREAST CE    Sloan Leiter, MD

## 2018-10-09 NOTE — Progress Notes (Signed)
Pt presents for ROB c/o constipation. Pt does not have CBG readings today but states reading has been WNL.

## 2018-10-09 NOTE — Patient Instructions (Signed)
You can buy Colace (docusate sodium) 100 mg by mouth once daily. If no improvement, take twice daily. This acts as a stool softener and should make it easier to have bowel movements. Please call the office with any questions.

## 2018-10-14 ENCOUNTER — Ambulatory Visit (HOSPITAL_COMMUNITY)
Admission: RE | Admit: 2018-10-14 | Discharge: 2018-10-14 | Disposition: A | Discharge status: Home / Self Care | Payer: BLUE CROSS/BLUE SHIELD | Source: Ambulatory Visit | Attending: Obstetrics and Gynecology | Admitting: Obstetrics and Gynecology

## 2018-10-14 ENCOUNTER — Other Ambulatory Visit (HOSPITAL_COMMUNITY): Payer: Self-pay | Admitting: *Deleted

## 2018-10-14 ENCOUNTER — Ambulatory Visit (HOSPITAL_COMMUNITY): Payer: BLUE CROSS/BLUE SHIELD | Admitting: *Deleted

## 2018-10-14 ENCOUNTER — Encounter (HOSPITAL_COMMUNITY): Payer: Self-pay

## 2018-10-14 VITALS — BP 112/57 | HR 75 | Wt 312.1 lb

## 2018-10-14 DIAGNOSIS — Z3A27 27 weeks gestation of pregnancy: Secondary | ICD-10-CM

## 2018-10-14 DIAGNOSIS — O09522 Supervision of elderly multigravida, second trimester: Secondary | ICD-10-CM | POA: Insufficient documentation

## 2018-10-14 DIAGNOSIS — D573 Sickle-cell trait: Secondary | ICD-10-CM | POA: Diagnosis present

## 2018-10-14 DIAGNOSIS — Z8632 Personal history of gestational diabetes: Secondary | ICD-10-CM

## 2018-10-14 DIAGNOSIS — O09523 Supervision of elderly multigravida, third trimester: Secondary | ICD-10-CM

## 2018-10-14 DIAGNOSIS — O09299 Supervision of pregnancy with other poor reproductive or obstetric history, unspecified trimester: Secondary | ICD-10-CM

## 2018-10-14 DIAGNOSIS — N63 Unspecified lump in unspecified breast: Secondary | ICD-10-CM | POA: Insufficient documentation

## 2018-10-14 DIAGNOSIS — O24410 Gestational diabetes mellitus in pregnancy, diet controlled: Secondary | ICD-10-CM | POA: Insufficient documentation

## 2018-10-14 DIAGNOSIS — Z362 Encounter for other antenatal screening follow-up: Secondary | ICD-10-CM | POA: Insufficient documentation

## 2018-10-14 DIAGNOSIS — O99212 Obesity complicating pregnancy, second trimester: Secondary | ICD-10-CM

## 2018-10-14 DIAGNOSIS — O09292 Supervision of pregnancy with other poor reproductive or obstetric history, second trimester: Secondary | ICD-10-CM | POA: Diagnosis not present

## 2018-10-16 ENCOUNTER — Other Ambulatory Visit: Payer: Self-pay | Admitting: Obstetrics

## 2018-10-16 ENCOUNTER — Telehealth: Payer: Self-pay

## 2018-10-16 DIAGNOSIS — Z3480 Encounter for supervision of other normal pregnancy, unspecified trimester: Secondary | ICD-10-CM

## 2018-10-16 MED ORDER — PRENATAL PLUS 27-1 MG PO TABS: 1.0000 | ORAL_TABLET | Freq: Every day | ORAL | 4 refills | Status: DC

## 2018-10-16 NOTE — Telephone Encounter (Signed)
Prenatal Plus Rx

## 2018-10-16 NOTE — Telephone Encounter (Signed)
Pt was paying for PNV's out of pocket. Pt would like a Rx sent that will be covered by her insurance.  Please advise.

## 2018-10-17 ENCOUNTER — Other Ambulatory Visit: Payer: Self-pay | Admitting: Obstetrics

## 2018-10-17 DIAGNOSIS — Z3480 Encounter for supervision of other normal pregnancy, unspecified trimester: Secondary | ICD-10-CM

## 2018-10-23 ENCOUNTER — Encounter: Payer: Self-pay | Admitting: Obstetrics and Gynecology

## 2018-10-23 ENCOUNTER — Ambulatory Visit (INDEPENDENT_AMBULATORY_CARE_PROVIDER_SITE_OTHER): Payer: BLUE CROSS/BLUE SHIELD | Admitting: Obstetrics and Gynecology

## 2018-10-23 ENCOUNTER — Other Ambulatory Visit: Payer: Self-pay

## 2018-10-23 VITALS — BP 108/72 | HR 73 | Wt 314.6 lb

## 2018-10-23 DIAGNOSIS — O24410 Gestational diabetes mellitus in pregnancy, diet controlled: Secondary | ICD-10-CM

## 2018-10-23 DIAGNOSIS — O09523 Supervision of elderly multigravida, third trimester: Secondary | ICD-10-CM

## 2018-10-23 DIAGNOSIS — Z23 Encounter for immunization: Secondary | ICD-10-CM

## 2018-10-23 DIAGNOSIS — N63 Unspecified lump in unspecified breast: Secondary | ICD-10-CM

## 2018-10-23 DIAGNOSIS — O99213 Obesity complicating pregnancy, third trimester: Secondary | ICD-10-CM

## 2018-10-23 DIAGNOSIS — O09529 Supervision of elderly multigravida, unspecified trimester: Secondary | ICD-10-CM

## 2018-10-23 DIAGNOSIS — Z3A28 28 weeks gestation of pregnancy: Secondary | ICD-10-CM

## 2018-10-23 NOTE — Progress Notes (Signed)
Patient reports good fetal movement, denies pain. Pt wants tdap and flu vaccine today.

## 2018-10-23 NOTE — Patient Instructions (Addendum)
You can buy Colace (docusate sodium) 100 mg by mouth once daily. If no improvement, take twice daily. This acts as a stool softener and should make it easier to have bowel movements. Please call the office with any questions.    High-Fiber Diet Fiber, also called dietary fiber, is a type of carbohydrate that is found in fruits, vegetables, whole grains, and beans. A high-fiber diet can have many health benefits. Your health care provider may recommend a high-fiber diet to help:  Prevent constipation. Fiber can make your bowel movements more regular.  Lower your cholesterol.  Relieve the following conditions: ? Swelling of veins in the anus (hemorrhoids). ? Swelling and irritation (inflammation) of specific areas of the digestive tract (uncomplicated diverticulosis). ? A problem of the large intestine (colon) that sometimes causes pain and diarrhea (irritable bowel syndrome, IBS).  Prevent overeating as part of a weight-loss plan.  Prevent heart disease, type 2 diabetes, and certain cancers. What is my plan? The recommended daily fiber intake in grams (g) includes:  38 g for men age 28 or younger.  30 g for men over age 17.  28 g for women age 53 or younger.  21 g for women over age 74. You can get the recommended daily intake of dietary fiber by:  Eating a variety of fruits, vegetables, grains, and beans.  Taking a fiber supplement, if it is not possible to get enough fiber through your diet. What do I need to know about a high-fiber diet?  It is better to get fiber through food sources rather than from fiber supplements. There is not a lot of research about how effective supplements are.  Always check the fiber content on the nutrition facts label of any prepackaged food. Look for foods that contain 5 g of fiber or more per serving.  Talk with a diet and nutrition specialist (dietitian) if you have questions about specific foods that are recommended or not recommended for  your medical condition, especially if those foods are not listed below.  Gradually increase how much fiber you consume. If you increase your intake of dietary fiber too quickly, you may have bloating, cramping, or gas.  Drink plenty of water. Water helps you to digest fiber. What are tips for following this plan?  Eat a wide variety of high-fiber foods.  Make sure that half of the grains that you eat each day are whole grains.  Eat breads and cereals that are made with whole-grain flour instead of refined flour or white flour.  Eat brown rice, bulgur wheat, or millet instead of white rice.  Start the day with a breakfast that is high in fiber, such as a cereal that contains 5 g of fiber or more per serving.  Use beans in place of meat in soups, salads, and pasta dishes.  Eat high-fiber snacks, such as berries, raw vegetables, nuts, and popcorn.  Choose whole fruits and vegetables instead of processed forms like juice or sauce. What foods can I eat?  Fruits Berries. Pears. Apples. Oranges. Avocado. Prunes and raisins. Dried figs. Vegetables Sweet potatoes. Spinach. Kale. Artichokes. Cabbage. Broccoli. Cauliflower. Green peas. Carrots. Squash. Grains Whole-grain breads. Multigrain cereal. Oats and oatmeal. Brown rice. Barley. Bulgur wheat. [place]. Quinoa. Bran muffins. Popcorn. Rye wafer crackers. Meats and other proteins Navy, kidney, and pinto beans. Soybeans. Split peas. Lentils. Nuts and seeds. Dairy Fiber-fortified yogurt. Beverages Fiber-fortified soy milk. Fiber-fortified orange juice. Other foods Fiber bars. The items listed above may not be a complete  list of recommended foods and beverages. Contact a dietitian for more options. What foods are not recommended? Fruits Fruit juice. Cooked, strained fruit. Vegetables Fried potatoes. Canned vegetables. Well-cooked vegetables. Grains White bread. Pasta made with refined flour. White rice. Meats and other  proteins Fatty cuts of meat. Fried chicken or fried fish. Dairy Milk. Yogurt. Cream cheese. Sour cream. Fats and oils Butters. Beverages Soft drinks. Other foods Cakes and pastries. The items listed above may not be a complete list of foods and beverages to avoid. Contact a dietitian for more information. Summary  Fiber is a type of carbohydrate. It is found in fruits, vegetables, whole grains, and beans.  There are many health benefits of eating a high-fiber diet, such as preventing constipation, lowering blood cholesterol, helping with weight loss, and reducing your risk of heart disease, diabetes, and certain cancers.  Gradually increase your intake of fiber. Increasing too fast can result in cramping, bloating, and gas. Drink plenty of water while you increase your fiber.  The best sources of fiber include whole fruits and vegetables, whole grains, nuts, seeds, and beans. This information is not intended to replace advice given to you by your health care provider. Make sure you discuss any questions you have with your health care provider. Document Released: 07/30/2005 Document Revised: 06/03/2017 Document Reviewed: 06/03/2017 Elsevier Interactive Patient Education  2019 [employer].

## 2018-10-23 NOTE — Progress Notes (Signed)
   PRENATAL VISIT NOTE  Subjective:  Cheryl Acosta is a 43 y.o. [id number] at 108w3d being seen today for ongoing prenatal care.  She is currently monitored for the following issues for this high-risk pregnancy and has AMA (advanced maternal age) multigravida 14+; Asthma exacerbation; Hyperglycemia; Encounter for supervision of high risk multigravida of advanced maternal age, antepartum; H/O gestational diabetes in prior pregnancy, currently pregnant; Obesity affecting pregnancy; Sickle cell trait ([place]); Asthma; Breast mass; and Gestational diabetes mellitus (GDM) on their problem list.  Patient reports no complaints.  Contractions: Not present. Vag. Bleeding: None.  Movement: Present. Denies leaking of fluid.   The following portions of the patient's history were reviewed and updated as appropriate: allergies, current medications, past family history, past medical history, past social history, past surgical history and problem list.   Objective:   Vitals:   10/23/18 1109  BP: 108/72  Pulse: 73  Weight: (!) 314 lb 9.6 oz (142.7 kg)    Fetal Status: Fetal Heart Rate (bpm): 135   Movement: Present     General:  Alert, oriented and cooperative. Patient is in no acute distress.  Skin: Skin is warm and dry. No rash noted.   Cardiovascular: Normal heart rate noted  Respiratory: Normal respiratory effort, no problems with respiration noted  Abdomen: Soft, gravid, appropriate for gestational age.  Pain/Pressure: Absent     Pelvic: Cervical exam deferred        Extremities: Normal range of motion.  Edema: Trace  Mental Status: Normal mood and affect. Normal behavior. Normal judgment and thought content.   Assessment and Plan:  Pregnancy: [id number] at [redacted]w[redacted]d 1. Encounter for supervision of high risk multigravida of advanced maternal age, antepartum Instructed to take colace for constipation, encouraged high fiber diet Flu and tdap today  2. Multigravida of advanced maternal age in third trimester  3. Diet controlled gestational diabetes mellitus (GDM) in third trimester Brought glucometer, keeping track on glucometer FG: 70-100 PP: 90-120  4. Breast mass Has f/u scheduled  5. Obesity affecting pregnancy in third trimester   Preterm labor symptoms and general obstetric precautions including but not limited to vaginal bleeding, contractions, leaking of fluid and fetal movement were reviewed in detail with the patient. Please refer to After Visit Summary for other counseling recommendations.   Return in about 2 weeks (around 11/06/2018) for OB visit (MD).  Future Appointments  Date Time Provider [place]  11/06/2018  9:30 AM Osborne Oman, MD [facility] None  11/18/2018  9:50 AM WH-MFC NURSE WH-MFC MFC-US  11/18/2018 10:00 AM WH-MFC [place] 3 WH-MFCUS MFC-US  03/24/2019  9:00 AM GI-BCG DIAG TOMO 1 GI-BCGMM GI-BREAST CE  03/24/2019  9:10 AM GI-BCG [place] 1 GI-BCGUS GI-BREAST CE    Sloan Leiter, MD

## 2018-11-06 ENCOUNTER — Other Ambulatory Visit: Payer: Self-pay

## 2018-11-06 ENCOUNTER — Ambulatory Visit (INDEPENDENT_AMBULATORY_CARE_PROVIDER_SITE_OTHER): Payer: BLUE CROSS/BLUE SHIELD | Admitting: Obstetrics & Gynecology

## 2018-11-06 DIAGNOSIS — O09529 Supervision of elderly multigravida, unspecified trimester: Secondary | ICD-10-CM

## 2018-11-06 DIAGNOSIS — D573 Sickle-cell trait: Secondary | ICD-10-CM

## 2018-11-06 DIAGNOSIS — O99213 Obesity complicating pregnancy, third trimester: Secondary | ICD-10-CM

## 2018-11-06 DIAGNOSIS — O24410 Gestational diabetes mellitus in pregnancy, diet controlled: Secondary | ICD-10-CM

## 2018-11-06 DIAGNOSIS — O09523 Supervision of elderly multigravida, third trimester: Secondary | ICD-10-CM

## 2018-11-06 DIAGNOSIS — Z3A30 30 weeks gestation of pregnancy: Secondary | ICD-10-CM

## 2018-11-06 MED ORDER — ALBUTEROL SULFATE HFA 108 (90 BASE) MCG/ACT IN AERS: 2.0000 | INHALATION_SPRAY | Freq: Four times a day (QID) | RESPIRATORY_TRACT | 2 refills | Status: AC | PRN

## 2018-11-06 MED ORDER — FLUTICASONE PROPIONATE HFA 110 MCG/ACT IN AERO: 2.0000 | INHALATION_SPRAY | Freq: Two times a day (BID) | RESPIRATORY_TRACT | 12 refills | Status: AC

## 2018-11-06 NOTE — Progress Notes (Signed)
   TELEHEALTH VIRTUAL OBSTETRICS VISIT ENCOUNTER NOTE  I connected with Cheryl Acosta on 11/06/18 at  9:30 AM EDT by telephone at home and verified that I am speaking with the correct person using two identifiers.   I discussed the limitations, risks, security and privacy concerns of performing an evaluation and management service by telephone and the availability of in person appointments. I also discussed with the patient that there may be a patient responsible charge related to this service. The patient expressed understanding and agreed to proceed.  Subjective:  Cheryl Acosta is a 43 y.o.  Married [id number] (12, 48, and 65 month old kids)  at [redacted]w[redacted]d being followed for ongoing prenatal care.  She is currently monitored for the following issues for this high-risk pregnancy and has AMA (advanced maternal age) multigravida 86+; Asthma exacerbation; Hyperglycemia; Encounter for supervision of high risk multigravida of advanced maternal age, antepartum; H/O gestational diabetes in prior pregnancy, currently pregnant; Obesity affecting pregnancy; Sickle cell trait ([place]); Asthma; Breast mass; and Gestational diabetes mellitus (GDM) on their problem list.   Reports fetal movement. Denies any contractions, bleeding or leaking of fluid.  She checks her sugars about 3 times per day. Fasting today 90, yesterday 2 hour was 121. She reports the highest value this week was 134 but most are less than 120. She knows that the 134 was "triggered" by the french fries she ate at that meal. She went to the diet class in 2018 and says that she doesn't need to do it again.  She is having problems with her inhaler- it doesn't come out  The following portions of the patient's history were reviewed and updated as appropriate: allergies, current medications, past family history, past medical history, past social history, past surgical history and problem list.   Objective:   General:  Alert, oriented and cooperative.   Mental  Status: Normal mood and affect perceived. Normal judgment and thought content.  Rest of physical exam deferred due to type of encounter  Assessment and Plan:  Pregnancy: G5P3013 at [redacted]w[redacted]d 1. Diet controlled gestational diabetes mellitus (GDM) in third trimester   2. Multigravida of advanced maternal age in third trimester   3. Encounter for supervision of high risk multigravida of advanced maternal age, antepartum   4. Obesity affecting pregnancy in third trimester   5. Sickle cell trait ([place])   Preterm labor symptoms and general obstetric precautions including but not limited to vaginal bleeding, contractions, leaking of fluid and fetal movement were reviewed in detail with the patient.  I discussed the assessment and treatment plan with the patient. The patient was provided an opportunity to ask questions and all were answered. The patient agreed with the plan and demonstrated an understanding of the instructions. The patient was advised to call back or seek an in-person office evaluation/go to [facility]U at [facility] for any urgent or concerning symptoms. Please refer to After Visit Summary for other counseling recommendations.   No follow-ups on file.  Future Appointments  Date Time Provider [place]  11/18/2018  9:50 AM [place] MFC-US  11/18/2018 10:00 AM WH-MFC [place] 3 WH-MFCUS MFC-US  03/24/2019  9:00 AM GI-BCG DIAG TOMO 1 GI-BCGMM GI-BREAST CE  03/24/2019  9:10 AM GI-BCG [place] 1 GI-BCGUS GI-BREAST CE    Emily Filbert, MD Center for [employer], [place]

## 2018-11-06 NOTE — Progress Notes (Signed)
S/w pt via tele-visit and pt reports fetal movement and occasional uterine irritability.

## 2018-11-18 ENCOUNTER — Ambulatory Visit (HOSPITAL_COMMUNITY): Payer: BLUE CROSS/BLUE SHIELD

## 2018-11-18 ENCOUNTER — Ambulatory Visit (INDEPENDENT_AMBULATORY_CARE_PROVIDER_SITE_OTHER): Payer: BLUE CROSS/BLUE SHIELD | Admitting: Obstetrics & Gynecology

## 2018-11-18 ENCOUNTER — Other Ambulatory Visit: Payer: Self-pay

## 2018-11-18 VITALS — BP 123/80 | HR 80 | Wt 314.0 lb

## 2018-11-18 DIAGNOSIS — O09529 Supervision of elderly multigravida, unspecified trimester: Secondary | ICD-10-CM

## 2018-11-18 DIAGNOSIS — O09523 Supervision of elderly multigravida, third trimester: Secondary | ICD-10-CM

## 2018-11-18 DIAGNOSIS — O24410 Gestational diabetes mellitus in pregnancy, diet controlled: Secondary | ICD-10-CM

## 2018-11-18 DIAGNOSIS — Z3A32 32 weeks gestation of pregnancy: Secondary | ICD-10-CM

## 2018-11-18 NOTE — Progress Notes (Signed)
   PRENATAL VISIT NOTE  Subjective:  Cheryl Acosta is a 43 y.o. [id number] at [redacted]w[redacted]d being seen today for ongoing prenatal care.  She is currently monitored for the following issues for this high-risk pregnancy and has AMA (advanced maternal age) multigravida 35+; Asthma exacerbation; Hyperglycemia; Encounter for supervision of high risk multigravida of advanced maternal age, antepartum; H/O gestational diabetes in prior pregnancy, currently pregnant; Obesity affecting pregnancy; Sickle cell trait ([place]); Asthma; Breast mass; and Gestational diabetes mellitus (GDM) on their problem list.  Patient reports no complaints.  Contractions: Not present. Vag. Bleeding: None.  Movement: Present. Denies leaking of fluid.   The following portions of the patient's history were reviewed and updated as appropriate: allergies, current medications, past family history, past medical history, past social history, past surgical history and problem list.   Objective:   Vitals:   11/18/18 0905  BP: 123/80  Pulse: 80  Weight: (!) 314 lb (142.4 kg)    Fetal Status: Fetal Heart Rate (bpm): 145   Movement: Present     General:  Alert, oriented and cooperative. Patient is in no acute distress.  Skin: Skin is warm and dry. No rash noted.   Cardiovascular: Normal heart rate noted  Respiratory: Normal respiratory effort, no problems with respiration noted  Abdomen: Soft, gravid, appropriate for gestational age.  Pain/Pressure: Absent     Pelvic: Cervical exam deferred        Extremities: Normal range of motion.     Mental Status: Normal mood and affect. Normal behavior. Normal judgment and thought content.   Assessment and Plan:  Pregnancy: [id number] at [redacted]w[redacted]d 1. Diet controlled gestational diabetes mellitus (GDM) in third trimester States FBS <96 and PP <120  2. Encounter for supervision of high risk multigravida of advanced maternal age, antepartum   3. Multigravida of advanced maternal age in third trimester    Preterm labor symptoms and general obstetric precautions including but not limited to vaginal bleeding, contractions, leaking of fluid and fetal movement were reviewed in detail with the patient. Please refer to After Visit Summary for other counseling recommendations.   Return in about 2 weeks (around 12/02/2018).  Future Appointments  Date Time Provider [place]  12/15/2018  1:45 PM Constant, Peggy, MD [facility] None  12/16/2018  9:30 AM WH-MFC NURSE WH-MFC MFC-US  12/16/2018  9:30 AM WH-MFC [place] 5 WH-MFCUS MFC-US  03/24/2019  9:00 AM GI-BCG DIAG TOMO 1 GI-BCGMM GI-BREAST CE  03/24/2019  9:10 AM GI-BCG [place] 1 GI-BCGUS GI-BREAST CE    Emeterio Reeve, MD

## 2018-11-18 NOTE — Patient Instructions (Signed)

## 2018-11-18 NOTE — Progress Notes (Signed)
ROB

## 2018-12-02 ENCOUNTER — Other Ambulatory Visit: Payer: Self-pay

## 2018-12-02 ENCOUNTER — Ambulatory Visit (INDEPENDENT_AMBULATORY_CARE_PROVIDER_SITE_OTHER): Payer: BLUE CROSS/BLUE SHIELD | Admitting: Obstetrics and Gynecology

## 2018-12-02 ENCOUNTER — Encounter: Payer: Self-pay | Admitting: Obstetrics and Gynecology

## 2018-12-02 DIAGNOSIS — O24410 Gestational diabetes mellitus in pregnancy, diet controlled: Secondary | ICD-10-CM

## 2018-12-02 DIAGNOSIS — Z3A34 34 weeks gestation of pregnancy: Secondary | ICD-10-CM

## 2018-12-02 DIAGNOSIS — O09529 Supervision of elderly multigravida, unspecified trimester: Secondary | ICD-10-CM

## 2018-12-02 DIAGNOSIS — O09523 Supervision of elderly multigravida, third trimester: Secondary | ICD-10-CM

## 2018-12-02 NOTE — Progress Notes (Signed)
Pt is on the phone for televisit. [redacted]w[redacted]d. Pt reports that she has been checking her BG level (fasting: 90, after meal: 121) Pt has been unable to check BP, she has a machine and cuff but she is working on getting batteries for it. Pt reports she is having trouble sleeping.

## 2018-12-02 NOTE — Progress Notes (Signed)
   TELEHEALTH VIRTUAL OBSTETRICS VISIT ENCOUNTER NOTE  I connected with Cheryl Acosta on 12/02/18 at 11:15 AM EDT by telephone at home and verified that I am speaking with the correct person using two identifiers.   I discussed the limitations, risks, security and privacy concerns of performing an evaluation and management service by telephone and the availability of in person appointments. I also discussed with the patient that there may be a patient responsible charge related to this service. The patient expressed understanding and agreed to proceed.  Subjective:  Cheryl Acosta is a 43 y.o. [id number] at [redacted]w[redacted]d being followed for ongoing prenatal care.  She is currently monitored for the following issues for this high-risk pregnancy and has AMA (advanced maternal age) multigravida 31+; Asthma exacerbation; Encounter for supervision of high risk multigravida of advanced maternal age, antepartum; H/O gestational diabetes in prior pregnancy, currently pregnant; Obesity affecting pregnancy; Sickle cell trait ([place]); Asthma; Breast mass; and Gestational diabetes mellitus (GDM) on their problem list.  Patient reports no complaints. Reports fetal movement. Denies any contractions, bleeding or leaking of fluid.   The following portions of the patient's history were reviewed and updated as appropriate: allergies, current medications, past family history, past medical history, past social history, past surgical history and problem list.   Objective:   General:  Alert, oriented and cooperative.   Mental Status: Normal mood and affect perceived. Normal judgment and thought content.  Rest of physical exam deferred due to type of encounter  Assessment and Plan:  Pregnancy: G5P3013 at [redacted]w[redacted]d 1. Encounter for supervision of high risk multigravida of advanced maternal age, antepartum Stable Vaginal cultures next visit  2. Diet controlled gestational diabetes mellitus (GDM) in third trimester CBG's in goal range  Continue with diet U/S f/u ordered  3. Multigravida of advanced maternal age in third trimester Stable  Preterm labor symptoms and general obstetric precautions including but not limited to vaginal bleeding, contractions, leaking of fluid and fetal movement were reviewed in detail with the patient.  I discussed the assessment and treatment plan with the patient. The patient was provided an opportunity to ask questions and all were answered. The patient agreed with the plan and demonstrated an understanding of the instructions. The patient was advised to call back or seek an in-person office evaluation/go to MAU at [facility] for any urgent or concerning symptoms. Please refer to After Visit Summary for other counseling recommendations.   I provided 11 minutes of non-face-to-face time during this encounter.  F/U in 2 weeks face to face for vaginal cultures  Future Appointments  Date Time Provider [place]  12/16/2018  8:30 AM Chancy Milroy, MD [facility] None  12/16/2018  9:30 AM WH-MFC NURSE WH-MFC MFC-US  12/16/2018  9:30 AM WH-MFC [place] 5 WH-MFCUS MFC-US  03/24/2019  9:00 AM GI-BCG DIAG TOMO 1 GI-BCGMM GI-BREAST CE  03/24/2019  9:10 AM GI-BCG [place] 1 GI-BCGUS GI-BREAST CE    Chancy Milroy, MD Center for [facility], [place]

## 2018-12-15 ENCOUNTER — Encounter: Payer: BLUE CROSS/BLUE SHIELD | Admitting: Obstetrics and Gynecology

## 2018-12-16 ENCOUNTER — Ambulatory Visit (HOSPITAL_COMMUNITY)
Admission: RE | Admit: 2018-12-16 | Discharge: 2018-12-16 | Disposition: A | Discharge status: Home / Self Care | Payer: BLUE CROSS/BLUE SHIELD | Source: Ambulatory Visit | Attending: Maternal & Fetal Medicine | Admitting: Maternal & Fetal Medicine

## 2018-12-16 ENCOUNTER — Ambulatory Visit (HOSPITAL_COMMUNITY): Payer: BLUE CROSS/BLUE SHIELD | Admitting: *Deleted

## 2018-12-16 ENCOUNTER — Encounter (HOSPITAL_COMMUNITY): Payer: Self-pay

## 2018-12-16 ENCOUNTER — Other Ambulatory Visit: Payer: Self-pay

## 2018-12-16 ENCOUNTER — Other Ambulatory Visit (HOSPITAL_COMMUNITY): Payer: Self-pay | Admitting: Maternal & Fetal Medicine

## 2018-12-16 ENCOUNTER — Encounter: Payer: Self-pay | Admitting: Obstetrics and Gynecology

## 2018-12-16 ENCOUNTER — Ambulatory Visit (INDEPENDENT_AMBULATORY_CARE_PROVIDER_SITE_OTHER): Payer: BLUE CROSS/BLUE SHIELD | Admitting: Obstetrics and Gynecology

## 2018-12-16 VITALS — BP 115/56 | HR 78 | Temp 98.2°F

## 2018-12-16 DIAGNOSIS — Z3A36 36 weeks gestation of pregnancy: Secondary | ICD-10-CM

## 2018-12-16 DIAGNOSIS — Z362 Encounter for other antenatal screening follow-up: Secondary | ICD-10-CM

## 2018-12-16 DIAGNOSIS — O09523 Supervision of elderly multigravida, third trimester: Secondary | ICD-10-CM

## 2018-12-16 DIAGNOSIS — O24410 Gestational diabetes mellitus in pregnancy, diet controlled: Secondary | ICD-10-CM

## 2018-12-16 DIAGNOSIS — Z113 Encounter for screening for infections with a predominantly sexual mode of transmission: Secondary | ICD-10-CM

## 2018-12-16 DIAGNOSIS — O09293 Supervision of pregnancy with other poor reproductive or obstetric history, third trimester: Secondary | ICD-10-CM

## 2018-12-16 DIAGNOSIS — Z862 Personal history of diseases of the blood and blood-forming organs and certain disorders involving the immune mechanism: Secondary | ICD-10-CM

## 2018-12-16 DIAGNOSIS — O09529 Supervision of elderly multigravida, unspecified trimester: Secondary | ICD-10-CM

## 2018-12-16 DIAGNOSIS — O99213 Obesity complicating pregnancy, third trimester: Secondary | ICD-10-CM | POA: Diagnosis not present

## 2018-12-16 NOTE — Progress Notes (Signed)
Subjective:  Cheryl Acosta is a 43 y.o. [id number] at [redacted]w[redacted]d being seen today for ongoing prenatal care.  She is currently monitored for the following issues for this high-risk pregnancy and has AMA (advanced maternal age) multigravida 64+; Encounter for supervision of high risk multigravida of advanced maternal age, antepartum; H/O gestational diabetes in prior pregnancy, currently pregnant; Obesity affecting pregnancy; Sickle cell trait ([place]); Asthma; Breast mass; and Gestational diabetes mellitus (GDM) on their problem list.  Patient reports general discomforts of pregnancy.  Contractions: Irregular. Vag. Bleeding: None.  Movement: Present. Denies leaking of fluid.   The following portions of the patient's history were reviewed and updated as appropriate: allergies, current medications, past family history, past medical history, past social history, past surgical history and problem list. Problem list updated.  Objective:   Vitals:   12/16/18 0838  BP: 127/79  Pulse: 80  Weight: (!) 319 lb 9.6 oz (145 kg)    Fetal Status: Fetal Heart Rate (bpm): 132   Movement: Present     General:  Alert, oriented and cooperative. Patient is in no acute distress.  Skin: Skin is warm and dry. No rash noted.   Cardiovascular: Normal heart rate noted  Respiratory: Normal respiratory effort, no problems with respiration noted  Abdomen: Soft, gravid, appropriate for gestational age. Pain/Pressure: Present     Pelvic:  Cervical exam performed        Extremities: Normal range of motion.  Edema: None  Mental Status: Normal mood and affect. Normal behavior. Normal judgment and thought content.   Urinalysis:      Assessment and Plan:  Pregnancy: [id number] at [redacted]w[redacted]d  1. Encounter for supervision of high risk multigravida of advanced maternal age, antepartum Labor precautions reviewed - Cervicovaginal ancillary only( [place]) - Strep Gp B NAA  2. Diet controlled gestational diabetes mellitus (GDM) in third  trimester CBG's in goal range U/S today Continue with diet  3. Multigravida of advanced maternal age in third trimester Stable  Preterm labor symptoms and general obstetric precautions including but not limited to vaginal bleeding, contractions, leaking of fluid and fetal movement were reviewed in detail with the patient. Please refer to After Visit Summary for other counseling recommendations.  Return in about 1 week (around 12/23/2018) for OB visit, televisit.   Chancy Milroy, MD

## 2018-12-16 NOTE — Progress Notes (Signed)
ROB/GBS. Reports no problems today. 

## 2018-12-16 NOTE — Patient Instructions (Signed)
Third Trimester of Pregnancy The third trimester is from week 28 through week 40 (months 7 through 9). The third trimester is a time when the unborn baby (fetus) is growing rapidly. At the end of the ninth month, the fetus is about 20 inches in length and weighs 6-10 pounds. Body changes during your third trimester Your body will continue to go through many changes during pregnancy. The changes vary from woman to woman. During the third trimester:  Your weight will continue to increase. You can expect to gain 25-35 pounds (11-16 kg) by the end of the pregnancy.  You may begin to get stretch marks on your hips, abdomen, and breasts.  You may urinate more often because the fetus is moving lower into your pelvis and pressing on your bladder.  You may develop or continue to have heartburn. This is caused by increased hormones that slow down muscles in the digestive tract.  You may develop or continue to have constipation because increased hormones slow digestion and cause the muscles that push waste through your intestines to relax.  You may develop hemorrhoids. These are swollen veins (varicose veins) in the rectum that can itch or be painful.  You may develop swollen, bulging veins (varicose veins) in your legs.  You may have increased body aches in the pelvis, back, or thighs. This is due to weight gain and increased hormones that are relaxing your joints.  You may have changes in your hair. These can include thickening of your hair, rapid growth, and changes in texture. Some women also have hair loss during or after pregnancy, or hair that feels dry or thin. Your hair will most likely return to normal after your baby is born.  Your breasts will continue to grow and they will continue to become tender. A yellow fluid (colostrum) may leak from your breasts. This is the first milk you are producing for your baby.  Your belly button may stick out.  You may notice more swelling in your hands,  face, or ankles.  You may have increased tingling or numbness in your hands, arms, and legs. The skin on your belly may also feel numb.  You may feel short of breath because of your expanding uterus.  You may have more problems sleeping. This can be caused by the size of your belly, increased need to urinate, and an increase in your body's metabolism.  You may notice the fetus "dropping," or moving lower in your abdomen (lightening).  You may have increased vaginal discharge.  You may notice your joints feel loose and you may have pain around your pelvic bone. What to expect at prenatal visits You will have prenatal exams every 2 weeks until week 36. Then you will have weekly prenatal exams. During a routine prenatal visit:  You will be weighed to make sure you and the baby are growing normally.  Your blood pressure will be taken.  Your abdomen will be measured to track your baby's growth.  The fetal heartbeat will be listened to.  Any test results from the previous visit will be discussed.  You may have a cervical check near your due date to see if your cervix has softened or thinned (effaced).  You will be tested for Group B streptococcus. This happens between 35 and 37 weeks. Your health care provider may ask you:  What your birth plan is.  How you are feeling.  If you are feeling the baby move.  If you have had any abnormal   symptoms, such as leaking fluid, bleeding, severe headaches, or abdominal cramping.  If you are using any tobacco products, including cigarettes, chewing tobacco, and electronic cigarettes.  If you have any questions. Other tests or screenings that may be performed during your third trimester include:  Blood tests that check for low iron levels (anemia).  Fetal testing to check the health, activity level, and growth of the fetus. Testing is done if you have certain medical conditions or if there are problems during the pregnancy.  Nonstress test  (NST). This test checks the health of your baby to make sure there are no signs of problems, such as the baby not getting enough oxygen. During this test, a belt is placed around your belly. The baby is made to move, and its heart rate is monitored during movement. What is false labor? False labor is a condition in which you feel small, irregular tightenings of the muscles in the womb (contractions) that usually go away with rest, changing position, or drinking water. These are called Braxton Hicks contractions. Contractions may last for hours, days, or even weeks before true labor sets in. If contractions come at regular intervals, become more frequent, increase in intensity, or become painful, you should see your health care provider. What are the signs of labor?  Abdominal cramps.  Regular contractions that start at 10 minutes apart and become stronger and more frequent with time.  Contractions that start on the top of the uterus and spread down to the lower abdomen and back.  Increased pelvic pressure and dull back pain.  A watery or bloody mucus discharge that comes from the vagina.  Leaking of amniotic fluid. This is also known as your "water breaking." It could be a slow trickle or a gush. Let your health care provider know if it has a color or strange odor. If you have any of these signs, call your health care provider right away, even if it is before your due date. Follow these instructions at home: Medicines  Follow your health care provider's instructions regarding medicine use. Specific medicines may be either safe or unsafe to take during pregnancy.  Take a prenatal vitamin that contains at least 600 micrograms (mcg) of folic acid.  If you develop constipation, try taking a stool softener if your health care provider approves. Eating and drinking   Eat a balanced diet that includes fresh fruits and vegetables, whole grains, good sources of protein such as meat, eggs, or tofu,  and low-fat dairy. Your health care provider will help you determine the amount of weight gain that is right for you.  Avoid raw meat and uncooked cheese. These carry germs that can cause birth defects in the baby.  If you have low calcium intake from food, talk to your health care provider about whether you should take a daily calcium supplement.  Eat four or five small meals rather than three large meals a day.  Limit foods that are high in fat and processed sugars, such as fried and sweet foods.  To prevent constipation: ? Drink enough fluid to keep your urine clear or pale yellow. ? Eat foods that are high in fiber, such as fresh fruits and vegetables, whole grains, and beans. Activity  Exercise only as directed by your health care provider. Most women can continue their usual exercise routine during pregnancy. Try to exercise for 30 minutes at least 5 days a week. Stop exercising if you experience uterine contractions.  Avoid heavy lifting.  Do   not exercise in extreme heat or humidity, or at high altitudes.  Wear low-heel, comfortable shoes.  Practice good posture.  You may continue to have sex unless your health care provider tells you otherwise. Relieving pain and discomfort  Take frequent breaks and rest with your legs elevated if you have leg cramps or low back pain.  Take warm sitz baths to soothe any pain or discomfort caused by hemorrhoids. Use hemorrhoid cream if your health care provider approves.  Wear a good support bra to prevent discomfort from breast tenderness.  If you develop varicose veins: ? Wear support pantyhose or compression stockings as told by your healthcare provider. ? Elevate your feet for 15 minutes, 3-4 times a day. Prenatal care  Write down your questions. Take them to your prenatal visits.  Keep all your prenatal visits as told by your health care provider. This is important. Safety  Wear your seat belt at all times when driving.  Make  a list of emergency phone numbers, including numbers for family, friends, the hospital, and police and fire departments. General instructions  Avoid cat litter boxes and soil used by cats. These carry germs that can cause birth defects in the baby. If you have a cat, ask someone to clean the litter box for you.  Do not travel far distances unless it is absolutely necessary and only with the approval of your health care provider.  Do not use hot tubs, steam rooms, or saunas.  Do not drink alcohol.  Do not use any products that contain nicotine or tobacco, such as cigarettes and e-cigarettes. If you need help quitting, ask your health care provider.  Do not use any medicinal herbs or unprescribed drugs. These chemicals affect the formation and growth of the baby.  Do not douche or use tampons or scented sanitary pads.  Do not cross your legs for long periods of time.  To prepare for the arrival of your baby: ? Take prenatal classes to understand, practice, and ask questions about labor and delivery. ? Make a trial run to the hospital. ? Visit the hospital and tour the maternity area. ? Arrange for maternity or paternity leave through employers. ? Arrange for family and friends to take care of pets while you are in the hospital. ? Purchase a rear-facing car seat and make sure you know how to install it in your car. ? Pack your hospital bag. ? Prepare the baby's nursery. Make sure to remove all pillows and stuffed animals from the baby's crib to prevent suffocation.  Visit your dentist if you have not gone during your pregnancy. Use a soft toothbrush to brush your teeth and be gentle when you floss. Contact a health care provider if:  You are unsure if you are in labor or if your water has broken.  You become dizzy.  You have mild pelvic cramps, pelvic pressure, or nagging pain in your abdominal area.  You have lower back pain.  You have persistent nausea, vomiting, or  diarrhea.  You have an unusual or bad smelling vaginal discharge.  You have pain when you urinate. Get help right away if:  Your water breaks before 37 weeks.  You have regular contractions less than 5 minutes apart before 37 weeks.  You have a fever.  You are leaking fluid from your vagina.  You have spotting or bleeding from your vagina.  You have severe abdominal pain or cramping.  You have rapid weight loss or weight gain.  You have   shortness of breath with chest pain.  You notice sudden or extreme swelling of your face, hands, ankles, feet, or legs.  Your baby makes fewer than 10 movements in 2 hours.  You have severe headaches that do not go away when you take medicine.  You have vision changes. Summary  The third trimester is from week 28 through week 40, months 7 through 9. The third trimester is a time when the unborn baby (fetus) is growing rapidly.  During the third trimester, your discomfort may increase as you and your baby continue to gain weight. You may have abdominal, leg, and back pain, sleeping problems, and an increased need to urinate.  During the third trimester your breasts will keep growing and they will continue to become tender. A yellow fluid (colostrum) may leak from your breasts. This is the first milk you are producing for your baby.  False labor is a condition in which you feel small, irregular tightenings of the muscles in the womb (contractions) that eventually go away. These are called Braxton Hicks contractions. Contractions may last for hours, days, or even weeks before true labor sets in.  Signs of labor can include: abdominal cramps; regular contractions that start at 10 minutes apart and become stronger and more frequent with time; watery or bloody mucus discharge that comes from the vagina; increased pelvic pressure and dull back pain; and leaking of amniotic fluid. This information is not intended to replace advice given to you by your  health care provider. Make sure you discuss any questions you have with your health care provider. Document Released: 07/24/2001 Document Revised: 09/04/2016 Document Reviewed: 09/04/2016 Elsevier Interactive Patient Education  2019 [employer]  

## 2018-12-17 ENCOUNTER — Other Ambulatory Visit (HOSPITAL_COMMUNITY): Payer: Self-pay | Admitting: *Deleted

## 2018-12-17 DIAGNOSIS — O09523 Supervision of elderly multigravida, third trimester: Secondary | ICD-10-CM

## 2018-12-17 LAB — CERVICOVAGINAL ANCILLARY ONLY
Chlamydia: NEGATIVE
Neisseria Gonorrhea: NEGATIVE

## 2018-12-18 LAB — STREP GP B NAA: Strep Gp B NAA: NEGATIVE

## 2018-12-23 ENCOUNTER — Other Ambulatory Visit (HOSPITAL_COMMUNITY): Payer: Self-pay | Admitting: *Deleted

## 2018-12-23 ENCOUNTER — Other Ambulatory Visit: Payer: Self-pay

## 2018-12-23 ENCOUNTER — Ambulatory Visit (HOSPITAL_COMMUNITY)
Admission: RE | Admit: 2018-12-23 | Discharge: 2018-12-23 | Disposition: A | Discharge status: Home / Self Care | Payer: BLUE CROSS/BLUE SHIELD | Source: Ambulatory Visit | Attending: Obstetrics and Gynecology | Admitting: Obstetrics and Gynecology

## 2018-12-23 ENCOUNTER — Ambulatory Visit (INDEPENDENT_AMBULATORY_CARE_PROVIDER_SITE_OTHER): Payer: BLUE CROSS/BLUE SHIELD | Admitting: Obstetrics

## 2018-12-23 ENCOUNTER — Ambulatory Visit (HOSPITAL_COMMUNITY): Payer: BLUE CROSS/BLUE SHIELD | Admitting: *Deleted

## 2018-12-23 ENCOUNTER — Encounter: Payer: Self-pay | Admitting: Obstetrics

## 2018-12-23 ENCOUNTER — Encounter (HOSPITAL_COMMUNITY): Payer: Self-pay

## 2018-12-23 VITALS — BP 111/71 | HR 85 | Temp 97.8°F

## 2018-12-23 DIAGNOSIS — N644 Mastodynia: Secondary | ICD-10-CM

## 2018-12-23 DIAGNOSIS — O24410 Gestational diabetes mellitus in pregnancy, diet controlled: Secondary | ICD-10-CM

## 2018-12-23 DIAGNOSIS — O09529 Supervision of elderly multigravida, unspecified trimester: Secondary | ICD-10-CM

## 2018-12-23 DIAGNOSIS — O09523 Supervision of elderly multigravida, third trimester: Secondary | ICD-10-CM

## 2018-12-23 DIAGNOSIS — O09293 Supervision of pregnancy with other poor reproductive or obstetric history, third trimester: Secondary | ICD-10-CM

## 2018-12-23 DIAGNOSIS — Z3A37 37 weeks gestation of pregnancy: Secondary | ICD-10-CM

## 2018-12-23 DIAGNOSIS — O26899 Other specified pregnancy related conditions, unspecified trimester: Secondary | ICD-10-CM

## 2018-12-23 DIAGNOSIS — R12 Heartburn: Secondary | ICD-10-CM

## 2018-12-23 DIAGNOSIS — Z862 Personal history of diseases of the blood and blood-forming organs and certain disorders involving the immune mechanism: Secondary | ICD-10-CM

## 2018-12-23 DIAGNOSIS — O99213 Obesity complicating pregnancy, third trimester: Secondary | ICD-10-CM | POA: Diagnosis not present

## 2018-12-23 DIAGNOSIS — O26893 Other specified pregnancy related conditions, third trimester: Secondary | ICD-10-CM

## 2018-12-23 MED ORDER — OMEPRAZOLE 20 MG PO CPDR: 20.0000 mg | DELAYED_RELEASE_CAPSULE | Freq: Two times a day (BID) | ORAL | 5 refills | Status: DC

## 2018-12-23 NOTE — Progress Notes (Signed)
   TELEHEALTH VIRTUAL OBSTETRICS PRENATAL VISIT ENCOUNTER NOTE  I connected with Amen Mullenax on 12/23/18 at 11:00 AM EDT by WebEx at home and verified that I am speaking with the correct person using two identifiers.   I discussed the limitations, risks, security and privacy concerns of performing an evaluation and management service by telephone and the availability of in person appointments. I also discussed with the patient that there may be a patient responsible charge related to this service. The patient expressed understanding and agreed to proceed. Subjective:  Florestine Carmical is a 43 y.o. [id number] at [redacted]w[redacted]d being seen today for ongoing prenatal care.  She is currently monitored for the following issues for this high-risk pregnancy and has AMA (advanced maternal age) multigravida 70+; Encounter for supervision of high risk multigravida of advanced maternal age, antepartum; H/O gestational diabetes in prior pregnancy, currently pregnant; Obesity affecting pregnancy; Sickle cell trait ([place]); Asthma; Breast mass; and Gestational diabetes mellitus (GDM) on their problem list.  Patient reports left breast pain 3 days ago, relieved with Tylenol no palpable masses, .  Reports fetal movement. Contractions: Irritability. Vag. Bleeding: None.  Movement: Present. Denies any contractions, bleeding or leaking of fluid.   The following portions of the patient's history were reviewed and updated as appropriate: allergies, current medications, past family history, past medical history, past social history, past surgical history and problem list.   Objective:  There were no vitals filed for this visit.  Fetal Status:     Movement: Present     General:  Alert, oriented and cooperative. Patient is in no acute distress.  Respiratory: Normal respiratory effort, no problems with respiration noted  Mental Status: Normal mood and affect. Normal behavior. Normal judgment and thought content.  Rest of physical exam  deferred due to type of encounter  Assessment and Plan:  Pregnancy: [id number] at [redacted]w[redacted]d  1. Encounter for supervision of high risk multigravida of advanced maternal age, antepartum  2. Diet controlled gestational diabetes mellitus (GDM) in third trimester - good glucose control with FBS's < 90 and postprandials < 120  3. Heartburn during pregnancy, antepartum Rx: - omeprazole (PRILOSEC) 20 MG capsule; Take 1 capsule (20 mg total) by mouth 2 (two) times daily before a meal.  Dispense: 60 capsule; Refill: 5  4. Mastodynia - will follow clinically and examine on next office visit if there is a recurrence    Term labor symptoms and general obstetric precautions including but not limited to vaginal bleeding, contractions, leaking of fluid and fetal movement were reviewed in detail with the patient. I discussed the assessment and treatment plan with the patient. The patient was provided an opportunity to ask questions and all were answered. The patient agreed with the plan and demonstrated an understanding of the instructions. The patient was advised to call back or seek an in-person office evaluation/go to [facility] at [facility] for any urgent or concerning symptoms. Please refer to After Visit Summary for other counseling recommendations.   I provided 10 minutes of face-to-face via WebEx time during this encounter.  Return in about 1 week (around 12/30/2018) for [facility].  Future Appointments  Date Time Provider [place]  12/30/2018 10:30 AM [place] MFC-US  12/30/2018 10:30 AM WH-MFC [place] 5 WH-MFCUS MFC-US  03/24/2019  9:00 AM GI-BCG DIAG TOMO 1 GI-BCGMM GI-BREAST CE  03/24/2019  9:10 AM GI-BCG [place] 1 GI-BCGUS GI-BREAST CE    Baltazar Najjar, MD [facility] 12-23-2018

## 2018-12-23 NOTE — Progress Notes (Signed)
Pain in left Breast  Pt took tylenol and warm compress  pain onset 3 days ago today pt feels better.

## 2018-12-30 ENCOUNTER — Ambulatory Visit (HOSPITAL_COMMUNITY)
Admission: RE | Admit: 2018-12-30 | Discharge: 2018-12-30 | Disposition: A | Discharge status: Home / Self Care | Payer: BLUE CROSS/BLUE SHIELD | Source: Ambulatory Visit | Attending: Obstetrics and Gynecology | Admitting: Obstetrics and Gynecology

## 2018-12-30 ENCOUNTER — Encounter (HOSPITAL_COMMUNITY): Payer: Self-pay

## 2018-12-30 ENCOUNTER — Ambulatory Visit (HOSPITAL_COMMUNITY): Payer: BLUE CROSS/BLUE SHIELD | Admitting: *Deleted

## 2018-12-30 ENCOUNTER — Other Ambulatory Visit: Payer: Self-pay

## 2018-12-30 ENCOUNTER — Telehealth (INDEPENDENT_AMBULATORY_CARE_PROVIDER_SITE_OTHER): Payer: BLUE CROSS/BLUE SHIELD | Admitting: Obstetrics and Gynecology

## 2018-12-30 ENCOUNTER — Encounter: Payer: Self-pay | Admitting: Obstetrics and Gynecology

## 2018-12-30 VITALS — BP 127/74 | HR 77

## 2018-12-30 VITALS — BP 137/65 | HR 79 | Temp 98.4°F

## 2018-12-30 DIAGNOSIS — O99213 Obesity complicating pregnancy, third trimester: Secondary | ICD-10-CM

## 2018-12-30 DIAGNOSIS — O09523 Supervision of elderly multigravida, third trimester: Secondary | ICD-10-CM | POA: Insufficient documentation

## 2018-12-30 DIAGNOSIS — O24410 Gestational diabetes mellitus in pregnancy, diet controlled: Secondary | ICD-10-CM | POA: Diagnosis not present

## 2018-12-30 DIAGNOSIS — Z3A38 38 weeks gestation of pregnancy: Secondary | ICD-10-CM

## 2018-12-30 DIAGNOSIS — Z862 Personal history of diseases of the blood and blood-forming organs and certain disorders involving the immune mechanism: Secondary | ICD-10-CM

## 2018-12-30 DIAGNOSIS — O09529 Supervision of elderly multigravida, unspecified trimester: Secondary | ICD-10-CM

## 2018-12-30 DIAGNOSIS — Z362 Encounter for other antenatal screening follow-up: Secondary | ICD-10-CM

## 2018-12-30 NOTE — Progress Notes (Signed)
TELEHEALTH OBSTETRICS PRENATAL VIRTUAL VIDEO VISIT ENCOUNTER NOTE  Provider location: [facility] [place]   I connected with Cheryl Acosta on 12/30/18 at  4:00 PM EDT by WebEx Encounter at home and verified that I am speaking with the correct person using two identifiers.   I discussed the limitations, risks, security and privacy concerns of performing an evaluation and management service by telephone and the availability of in person appointments. I also discussed with the patient that there may be a patient responsible charge related to this service. The patient expressed understanding and agreed to proceed. Subjective:  [name] is a 43 y.o. [id number] at [redacted]w[redacted]d being seen today for ongoing prenatal care.  She is currently monitored for the following issues for this high-risk pregnancy and has AMA (advanced maternal age) multigravida 71+; Encounter for supervision of high risk multigravida of advanced maternal age, antepartum; H/O gestational diabetes in prior pregnancy, currently pregnant; Obesity affecting pregnancy; Sickle cell trait ([place]); Asthma; Breast mass; and Gestational diabetes mellitus (GDM) on their problem list.  Patient reports general discomforts of pregnancy.  Contractions: Irregular. Vag. Bleeding: None.  Movement: Present. Denies any leaking of fluid.   The following portions of the patient's history were reviewed and updated as appropriate: allergies, current medications, past family history, past medical history, past social history, past surgical history and problem list.   Objective:   Vitals:   12/30/18 1554  BP: 127/74  Pulse: 77    Fetal Status:     Movement: Present     General:  Alert, oriented and cooperative. Patient is in no acute distress.  Respiratory: Normal respiratory effort, no problems with respiration noted  Mental Status: Normal mood and affect. Normal behavior. Normal judgment and thought content.  Rest of physical exam  deferred due to type of encounter  Imaging: [place] Mfm Fetal Bpp Wo Non Stress  Result Date: 12/30/2018 ----------------------------------------------------------------------  OBSTETRICS REPORT                       (Signed Final 12/30/2018 11:26 am) ---------------------------------------------------------------------- Patient Info  ID #:       [id number]                          D.O.B.:  November 13, 1975 (43 yrs)  Name:       Cheryl Acosta                    Visit Date: 12/30/2018 10:21 am ---------------------------------------------------------------------- Performed By  Performed By:     Wilnette Kales        Ref. Address:     [address]                                                             [place]  Attending:        Tama High MD        [facility]  Referred By:      [facility] ---------------------------------------------------------------------- Orders   #  Description                          Code         Ordered By   1  [place] MFM FETAL BPP WO NON              76819.01     CORENTHIAN      STRESS                                            BOOKER  ----------------------------------------------------------------------   #  Order #                    Accession #                 Episode #   1  [id number]                  [id number]                  [id number]  ---------------------------------------------------------------------- Indications   Gestational diabetes in pregnancy, diet        O24.410   controlled   Maternal morbid obesity (pre preganancy        O99.210 E66.01   bmi  48.38)   Advanced maternal age multigravida 22,         O106.523   third trimester   Encounter for other  antenatal screening        Z36.2   follow-up (Low Risk NIPS)   History of sickle cell trait                   Z86.2   [redacted] weeks gestation of pregnancy                Z3A.38  ---------------------------------------------------------------------- Vital Signs  Weight (lb): 314  Height:        5'7"  BMI:         49.17 ---------------------------------------------------------------------- Fetal Evaluation  Num Of Fetuses:         1  Fetal Heart Rate(bpm):  141  Cardiac Activity:       Observed  Presentation:           Cephalic  Placenta:               Anterior  P. Cord Insertion:      Visualized, central  Amniotic Fluid  AFI FV:      Within normal limits  AFI Sum(cm)     %Tile       Largest Pocket(cm)  18.09           71          7.78  RUQ(cm)       RLQ(cm)       LUQ(cm)        LLQ(cm)  7.78          2.89          3.96           3.45 ---------------------------------------------------------------------- Biophysical Evaluation  Amniotic F.V:   Within normal limits       F. Tone:        Observed  F. Movement:    Observed                   Score:          8/8  F. Breathing:   Observed ---------------------------------------------------------------------- OB History  Blood Type:    A+  Gravidity:    5         Term:   3  TOP:          1        Living:  3 ---------------------------------------------------------------------- Gestational Age  LMP:           34w 4d        Date:  05/02/18                 EDD:   02/06/19  Best:          38w 1d     Det. By:  U/S  (09/16/18)          EDD:   01/12/19 ---------------------------------------------------------------------- Anatomy  Ventricles:            Appears normal         Kidneys:                Appear normal  Heart:                 Appears normal         Bladder:                Appears normal                         (4CH, axis, and                         situs)  Stomach:               Appears normal, left                         sided  ---------------------------------------------------------------------- Impression  Advanced maternal age. Gestational diabetes. Apparently  well-controlled on diet.  Amniotic fluid is normal and good fetal activity is seen.  Antenatal testing is reassuring. BPP 8/8. ---------------------------------------------------------------------- Recommendations  Consider delivery at 39 weeks. ----------------------------------------------------------------------                  Tama High, MD Electronically Signed Final Report   12/30/2018 11:26 am ----------------------------------------------------------------------  [place] Mfm Fetal Bpp Wo Non Stress  Result Date: 12/23/2018 ----------------------------------------------------------------------  OBSTETRICS REPORT                       (Signed Final 12/23/2018 04:30 pm) ---------------------------------------------------------------------- Patient Info  ID #:       [id number]                          D.O.B.:  02-May-1976 (43 yrs)  Name:       Cheryl Acosta                    Visit Date: 12/23/2018 08:51 am ---------------------------------------------------------------------- Performed By  Performed By:     Rodrigo Ran BS      Ref. Address:     [address] [place]                                                             [place]  Attending:        Sander Nephew      [facility]                    MD                                       [facility]  Referred By:      Bessie ---------------------------------------------------------------------- Orders   #  Description  Code         Ordered By   1  [place] MFM FETAL BPP WO NON              [id number]     CORENTHIAN      STRESS                                             BOOKER  ----------------------------------------------------------------------   #  Order #                    Accession #                 Episode #   1  [id number]                  [id number]                  [id number]  ---------------------------------------------------------------------- Indications   Poor obstetric history: Previous gestational   O09.299   diabetes   Maternal morbid obesity (pre preganancy        O99.210 E66.01   bmi  23.38)   Advanced maternal age multigravida 23,         O73.523   third trimester   Encounter for other antenatal screening        Z36.2   follow-up (Low Risk NIPS)   History of sickle cell trait                   Z86.2   [redacted] weeks gestation of pregnancy                Z3A.37  ---------------------------------------------------------------------- Vital Signs                                                 Height:        5'7" ---------------------------------------------------------------------- Fetal Evaluation  Num Of Fetuses:         1  Fetal Heart Rate(bpm):  137  Cardiac Activity:       Observed  Presentation:           Cephalic  Amniotic Fluid  AFI FV:      Within normal limits  AFI Sum(cm)     %Tile       Largest Pocket(cm)  14.71           55          4.68  RUQ(cm)       RLQ(cm)       LUQ(cm)        LLQ(cm)  4.68          4.01          2.74           3.28 ---------------------------------------------------------------------- Biophysical Evaluation  Amniotic F.V:   Within normal limits       F. Tone:        Observed  F. Movement:    Observed                   Score:          8/8  F. Breathing:   Observed ---------------------------------------------------------------------- OB History  Blood Type:  A+  Gravidity:    5         Term:   3  TOP:          1        Living:  3 ---------------------------------------------------------------------- Gestational Age  LMP:           33w 4d        Date:  05/02/18                 EDD:   02/06/19  Best:          37w 1d      Det. By:  U/S  (09/16/18)          EDD:   01/12/19 ---------------------------------------------------------------------- Impression  Biophysical profile 8/8 ---------------------------------------------------------------------- Recommendations  Follow up as clinically indicated. ----------------------------------------------------------------------               Sander Nephew, MD Electronically Signed Final Report   12/23/2018 04:30 pm ----------------------------------------------------------------------  [place] Mfm Fetal Bpp Wo Non Stress  Result Date: 12/16/2018 ----------------------------------------------------------------------  OBSTETRICS REPORT                       (Signed Final 12/16/2018 02:00 pm) ---------------------------------------------------------------------- Patient Info  ID #:       [id number]                          D.O.B.:  11/23/1975 (43 yrs)  Name:       Cheryl Acosta                    Visit Date: 12/16/2018 09:24 am ---------------------------------------------------------------------- Performed By  Performed By:     Georgie Chard        Ref. Address:     [place]                                                             [place]                                                             [place]  Attending:        Sander Nephew      [facility]                    MD                                       [facility]  Referred By:      [facility] ---------------------------------------------------------------------- Orders   #  Description                          Code         Ordered By   1  [place] MFM OB FOLLOW UP                  76816.01     Sander Nephew   2  [place] MFM FETAL BPP WO NON              76819.01     CORENTHIAN       STRESS                                            BOOKER  ----------------------------------------------------------------------   #  Order #                    Accession #                 Episode #   1  [id number]                  [id number]                  [id number]   2  [id number]                  [id number]                  [id number]  ---------------------------------------------------------------------- Indications   Poor obstetric history: Previous gestational   O09.299   diabetes   Maternal morbid obesity (pre preganancy        O99.210 E66.01   bmi  48.38)   Advanced maternal age multigravida 42+,        O46.523   third trimester   Encounter for other antenatal screening        Z36.2   follow-up (Low Risk NIPS)   History of sickle cell trait                   Z86.2   [redacted] weeks gestation of pregnancy                Z3A.36  ---------------------------------------------------------------------- Vital Signs  Weight (lb): 319                               Height:        5'7"  BMI:         49.96 ---------------------------------------------------------------------- Fetal Evaluation  Num Of Fetuses:         1  Fetal Heart Rate(bpm):  158  Cardiac Activity:       Observed  Presentation:           Cephalic  Placenta:               Anterior  P. Cord Insertion:      Previously Visualized  Amniotic Fluid  AFI FV:      Within normal limits  AFI Sum(cm)     %Tile       Largest Pocket(cm)  9.51            19          4.47  RUQ(cm)       RLQ(cm)       LUQ(cm)        LLQ(cm)  3.03          2.01          4.47           0 ---------------------------------------------------------------------- Biophysical Evaluation  Amniotic F.V:   Within normal limits       F. Tone:        Observed  F. Movement:    Observed                   Score:          8/8  F. Breathing:   Observed ---------------------------------------------------------------------- Biometry  BPD:      87.1  mm     G. Age:  35w 1d         31  %    CI:         79.9   %     70 - 86                                                          FL/HC:      22.2   %    20.1 - 22.1  HC:      307.9  mm     G. Age:  34w 3d        < 3  %    HC/AC:      0.91        0.93 - 1.11  AC:      336.9  mm     G. Age:  37w 4d         91  %    FL/BPD:     78.4   %    71 - 87  FL:       68.3  mm     G. Age:  35w 0d         21  %    FL/AC:      20.3   %    20 - 24  LV:        2.7  mm  Est. FW:    2913  gm      6 lb 7 oz     69  % ---------------------------------------------------------------------- OB History  Blood Type:    A+  Gravidity:    5         Term:   3  TOP:  1        Living:  3 ---------------------------------------------------------------------- Gestational Age  LMP:           32w 4d        Date:  05/02/18                 EDD:   02/06/19  U/S Today:     35w 4d                                        EDD:   01/16/19  Best:          36w 1d     Det. By:  U/S  (09/16/18)          EDD:   01/12/19 ---------------------------------------------------------------------- Anatomy  Cranium:               Appears normal         Aortic Arch:            Previously seen  Cavum:                 Appears normal         Ductal Arch:            Previously seen  Ventricles:            Appears normal         Diaphragm:              Appears normal  Choroid Plexus:        Previously seen        Stomach:                Appears normal, left                                                                        sided  Cerebellum:            Previously seen        Abdomen:                Appears normal  Posterior Fossa:       Previously seen        Abdominal Wall:         Not well visualized  Nuchal Fold:           Not applicable (>16    Cord Vessels:           Previously seen                         wks GA)  Face:                  Orbits prevl; profile  Kidneys:                Appear normal                         not well visualized  Lips:                  Appears normal  Bladder:                Appears normal   Thoracic:              Appears normal         Spine:                  Previously seen  Heart:                 Previously seen        Upper Extremities:      Previously seen  RVOT:                  Previously seen        Lower Extremities:      Previously seen  LVOT:                  Previously seen  Other:  Fetus appears to be female. Heels and 5th digit previously visualized.          Technically difficult due to maternal habitus and fetal position. ---------------------------------------------------------------------- Cervix Uterus Adnexa  Cervix  Not visualized (advanced GA >24wks) ---------------------------------------------------------------------- Impression  Normal interval growth.  Biophysical profile 8/8 ---------------------------------------------------------------------- Recommendations  Follow up as clinically indicated. ----------------------------------------------------------------------               Sander Nephew, MD Electronically Signed Final Report   12/16/2018 02:00 pm ----------------------------------------------------------------------  [place] Mfm Ob Follow Up  Result Date: 12/16/2018 ----------------------------------------------------------------------  OBSTETRICS REPORT                       (Signed Final 12/16/2018 02:00 pm) ---------------------------------------------------------------------- Patient Info  ID #:       [id number]                          D.O.B.:  1976-03-04 (43 yrs)  Name:       Cheryl Acosta                    Visit Date: 12/16/2018 09:24 am ---------------------------------------------------------------------- Performed By  Performed By:     Georgie Chard        Ref. Address:     [address] [place]  [place]  Attending:        Sander Nephew      [facility]                                       [facility]  Referred By:      [facility] ---------------------------------------------------------------------- Orders   #  Description                          Code         Ordered By   1  [place] MFM OB FOLLOW UP                  76816.01     Sander Nephew   2  [place] MFM FETAL BPP WO NON              76819.01     CORENTHIAN      STRESS                                            BOOKER  ----------------------------------------------------------------------   #  Order #                    Accession #                 Episode #   1  [id number]                  [id number]                  [id number]   2  [id number]                  [id number]                  [id number]  ---------------------------------------------------------------------- Indications   Poor obstetric history: Previous gestational   O09.299   diabetes   Maternal morbid obesity (pre preganancy        O99.210 E66.01   bmi  48.38)   Advanced maternal age multigravida 68+,        O45.523   third trimester   Encounter for other antenatal screening        Z36.2   follow-up (Low Risk NIPS)   History of sickle cell trait                   Z86.2   [redacted] weeks gestation of pregnancy                Z3A.36  ---------------------------------------------------------------------- Vital Signs  Weight (lb): 319  Height:        5'7"  BMI:         49.96 ---------------------------------------------------------------------- Fetal Evaluation  Num Of Fetuses:         1  Fetal Heart Rate(bpm):  158  Cardiac Activity:       Observed  Presentation:           Cephalic  Placenta:               Anterior  P. Cord Insertion:      Previously Visualized  Amniotic Fluid  AFI FV:      Within normal limits  AFI Sum(cm)     %Tile       Largest Pocket(cm)   9.51            19          4.47  RUQ(cm)       RLQ(cm)       LUQ(cm)        LLQ(cm)  3.03          2.01          4.47           0 ---------------------------------------------------------------------- Biophysical Evaluation  Amniotic F.V:   Within normal limits       F. Tone:        Observed  F. Movement:    Observed                   Score:          8/8  F. Breathing:   Observed ---------------------------------------------------------------------- Biometry  BPD:      87.1  mm     G. Age:  35w 1d         31  %    CI:         79.9   %    70 - 86                                                          FL/HC:      22.2   %    20.1 - 22.1  HC:      307.9  mm     G. Age:  34w 3d        < 3  %    HC/AC:      0.91        0.93 - 1.11  AC:      336.9  mm     G. Age:  37w 4d         91  %    FL/BPD:     78.4   %    71 - 87  FL:       68.3  mm     G. Age:  35w 0d         21  %    FL/AC:      20.3   %    20 - 24  LV:        2.7  mm  Est. FW:    2913  gm      6 lb 7 oz     69  % ---------------------------------------------------------------------- OB History  Blood Type:    A+  Gravidity:  5         Term:   3  TOP:          1        Living:  3 ---------------------------------------------------------------------- Gestational Age  LMP:           32w 4d        Date:  05/02/18                 EDD:   02/06/19  U/S Today:     35w 4d                                        EDD:   01/16/19  Best:          36w 1d     Det. By:  U/S  (09/16/18)          EDD:   01/12/19 ---------------------------------------------------------------------- Anatomy  Cranium:               Appears normal         Aortic Arch:            Previously seen  Cavum:                 Appears normal         Ductal Arch:            Previously seen  Ventricles:            Appears normal         Diaphragm:              Appears normal  Choroid Plexus:        Previously seen        Stomach:                Appears normal, left                                                                         sided  Cerebellum:            Previously seen        Abdomen:                Appears normal  Posterior Fossa:       Previously seen        Abdominal Wall:         Not well visualized  Nuchal Fold:           Not applicable (>19    Cord Vessels:           Previously seen                         wks GA)  Face:                  Orbits prevl; profile  Kidneys:                Appear normal  not well visualized  Lips:                  Appears normal         Bladder:                Appears normal  Thoracic:              Appears normal         Spine:                  Previously seen  Heart:                 Previously seen        Upper Extremities:      Previously seen  RVOT:                  Previously seen        Lower Extremities:      Previously seen  LVOT:                  Previously seen  Other:  Fetus appears to be female. Heels and 5th digit previously visualized.          Technically difficult due to maternal habitus and fetal position. ---------------------------------------------------------------------- Cervix Uterus Adnexa  Cervix  Not visualized (advanced GA >24wks) ---------------------------------------------------------------------- Impression  Normal interval growth.  Biophysical profile 8/8 ---------------------------------------------------------------------- Recommendations  Follow up as clinically indicated. ----------------------------------------------------------------------               Sander Nephew, MD Electronically Signed Final Report   12/16/2018 02:00 pm ----------------------------------------------------------------------   Assessment and Plan:  Pregnancy: [id number] at [redacted]w[redacted]d 1. Encounter for supervision of high risk multigravida of advanced maternal age, antepartum Stable Labor precautions  2. Diet controlled gestational diabetes mellitus (GDM) in third trimester Reports CBG's in goal range Continue with diet EFW 69 % on  12/16/18 BPP 8/8 today  3. Multigravida of advanced maternal age in third trimester    Term labor symptoms and general obstetric precautions including but not limited to vaginal bleeding, contractions, leaking of fluid and fetal movement were reviewed in detail with the patient. I discussed the assessment and treatment plan with the patient. The patient was provided an opportunity to ask questions and all were answered. The patient agreed with the plan and demonstrated an understanding of the instructions. The patient was advised to call back or seek an in-person office evaluation/go to [facility] at [facility] for any urgent or concerning symptoms. Please refer to After Visit Summary for other counseling recommendations.   I provided 11 minutes of face-to-face time during this encounter.  Return in about 1 week (around 01/06/2019) for OB visit, face to face.for cervical exam and discuss IOL  Future Appointments  Date Time Provider [place]  03/24/2019  9:00 AM [facility] DIAG TOMO 1 GI-BCGMM GI-[facility]  03/24/2019  9:10 AM [facility] [place] 1 GI-BCGUS GI[facility]    Chancy Milroy, MD [facility] [place]

## 2018-12-30 NOTE — Progress Notes (Signed)
Webex ROB GDM pt- Highest fasting 83, after eating 122 U/S completed today.

## 2019-01-06 ENCOUNTER — Other Ambulatory Visit: Payer: Self-pay

## 2019-01-06 ENCOUNTER — Telehealth (HOSPITAL_COMMUNITY): Payer: Self-pay | Admitting: *Deleted

## 2019-01-06 ENCOUNTER — Encounter (HOSPITAL_COMMUNITY): Payer: Self-pay | Admitting: *Deleted

## 2019-01-06 ENCOUNTER — Encounter: Payer: Self-pay | Admitting: Obstetrics and Gynecology

## 2019-01-06 ENCOUNTER — Ambulatory Visit (INDEPENDENT_AMBULATORY_CARE_PROVIDER_SITE_OTHER): Payer: BLUE CROSS/BLUE SHIELD | Admitting: Obstetrics and Gynecology

## 2019-01-06 VITALS — BP 121/79 | HR 80 | Wt 316.4 lb

## 2019-01-06 DIAGNOSIS — O09529 Supervision of elderly multigravida, unspecified trimester: Secondary | ICD-10-CM

## 2019-01-06 DIAGNOSIS — O24410 Gestational diabetes mellitus in pregnancy, diet controlled: Secondary | ICD-10-CM

## 2019-01-06 DIAGNOSIS — O09523 Supervision of elderly multigravida, third trimester: Secondary | ICD-10-CM

## 2019-01-06 DIAGNOSIS — Z3A39 39 weeks gestation of pregnancy: Secondary | ICD-10-CM

## 2019-01-06 MED ORDER — DOCUSATE SODIUM 100 MG PO CAPS: 100.0000 mg | ORAL_CAPSULE | Freq: Two times a day (BID) | ORAL | 2 refills | Status: DC | PRN

## 2019-01-06 NOTE — Progress Notes (Signed)
   PRENATAL VISIT NOTE  Subjective:  Cheryl Acosta is a 43 y.o. [id number] at [redacted]w[redacted]d being seen today for ongoing prenatal care.  She is currently monitored for the following issues for this high-risk pregnancy and has AMA (advanced maternal age) multigravida 60+; Encounter for supervision of high risk multigravida of advanced maternal age, antepartum; H/O gestational diabetes in prior pregnancy, currently pregnant; Obesity affecting pregnancy; Sickle cell trait ([place]); Asthma; Breast mass; and Gestational diabetes mellitus (GDM) on their problem list.  Patient reports no complaints.  Contractions: Irregular. Vag. Bleeding: None.  Movement: Present. Denies leaking of fluid.   The following portions of the patient's history were reviewed and updated as appropriate: allergies, current medications, past family history, past medical history, past social history, past surgical history and problem list.   Objective:   Vitals:   01/06/19 1021  BP: 121/79  Pulse: 80  Weight: (!) 316 lb 6.4 oz (143.5 kg)    Fetal Status: Fetal Heart Rate (bpm): 141 Fundal Height: 40 cm Movement: Present  Presentation: Vertex  General:  Alert, oriented and cooperative. Patient is in no acute distress.  Skin: Skin is warm and dry. No rash noted.   Cardiovascular: Normal heart rate noted  Respiratory: Normal respiratory effort, no problems with respiration noted  Abdomen: Soft, gravid, appropriate for gestational age.  Pain/Pressure: Present     Pelvic: Cervical exam performed Dilation: Fingertip Effacement (%): Thick Station: Ballotable  Extremities: Normal range of motion.  Edema: None  Mental Status: Normal mood and affect. Normal behavior. Normal judgment and thought content.   Assessment and Plan:  Pregnancy: [id number] at [redacted]w[redacted]d 1. Encounter for supervision of high risk multigravida of advanced maternal age, antepartum Patient reports feeling well Will schedule IOL at 40 weeks  2. Multigravida of advanced  maternal age in third trimester   3. Diet controlled gestational diabetes mellitus (GDM) in third trimester Patient did not bring records but reports fasting as high as 93 and pp as high as 121 Continue diet control  Term labor symptoms and general obstetric precautions including but not limited to vaginal bleeding, contractions, leaking of fluid and fetal movement were reviewed in detail with the patient. Please refer to After Visit Summary for other counseling recommendations.   Return for postpartum.  Future Appointments  Date Time Provider [place]  03/24/2019  9:00 AM [facility] DI[facility] TOMO 1 [facility]  03/24/2019  9:10 AM [facility] [place] 1 [facility]    Mora Bellman, MD

## 2019-01-06 NOTE — Telephone Encounter (Signed)
Preadmission screen  

## 2019-01-06 NOTE — Progress Notes (Signed)
Pt is here for ROB. [redacted]w[redacted]d.

## 2019-01-06 NOTE — Addendum Note (Signed)
Addended by: Mora Bellman on: 01/06/2019 10:59 AM   Modules accepted: Orders

## 2019-01-08 ENCOUNTER — Other Ambulatory Visit (HOSPITAL_COMMUNITY)
Admission: RE | Admit: 2019-01-08 | Discharge: 2019-01-08 | Disposition: A | Discharge status: Home / Self Care | Payer: BLUE CROSS/BLUE SHIELD | Source: Ambulatory Visit | Attending: Obstetrics and Gynecology | Admitting: Obstetrics and Gynecology

## 2019-01-08 ENCOUNTER — Other Ambulatory Visit: Payer: Self-pay

## 2019-01-08 DIAGNOSIS — Z1159 Encounter for screening for other viral diseases: Secondary | ICD-10-CM | POA: Diagnosis present

## 2019-01-08 NOTE — MAU Note (Signed)
Swab collected without difficulty. 

## 2019-01-09 ENCOUNTER — Other Ambulatory Visit (HOSPITAL_COMMUNITY): Payer: Self-pay | Admitting: *Deleted

## 2019-01-09 LAB — NOVEL CORONAVIRUS, NAA (HOSP ORDER, SEND-OUT TO REF LAB; TAT 18-24 HRS): SARS-CoV-2, NAA: NOT DETECTED

## 2019-01-10 ENCOUNTER — Other Ambulatory Visit: Payer: Self-pay | Admitting: Advanced Practice Midwife

## 2019-01-10 NOTE — H&P (Deleted)
  The note originally documented on this encounter has been moved the the encounter in which it belongs.  

## 2019-01-10 NOTE — H&P (Signed)
Capricia Serda is a 43 y.o. female [phone] with IUP at [redacted]w[redacted]d presenting for IOL for AMA, A1DM (dx at 21 weeks)  [facility].  Prenatal History/Complications:  SVD X 3 at term, pelvis proven to 8# 1 oz 11 week TAB Hx GDM Asthma Morbid obesity  Past Medical History: Past Medical History:  Diagnosis Date  . Anemia    not on Iron  . Asthma    on Symbicort inhaler; uses prn; last used 04/28/17  . Diabetes mellitus without complication ([place])   . Environmental allergies   . GERD (gastroesophageal reflux disease)    during pregnancy only  . Gestational diabetes    diet-controlled    Past Surgical History: Past Surgical History:  Procedure Laterality Date  . BREAST SURGERY  2003   BILATERAL LUMPECTOMY   . DILATION AND EVACUATION N/A 12/12/2015   Procedure: DILATATION AND EVACUATION;  Surgeon: Shelly Bombard, MD;  Location: [facility];  Service: Gynecology;  Laterality: N/A;  . THUMB FUSION      Obstetrical History: OB History    Gravida  5   Para  3   Term  3   Preterm  0   AB  1   Living  3     SAB  0   TAB  1   Ectopic  0   Multiple  0   Live Births  3           Social History: Social History   Socioeconomic History  . Marital status: Married    Spouse name: Not on file  . Number of children: Not on file  . Years of education: Not on file  . Highest education level: Not on file  Occupational History  . Not on file  Social Needs  . Financial resource strain: Not hard at all  . Food insecurity:    Worry: Never true    Inability: Never true  . Transportation needs:    Medical: No    Non-medical: Not on file  Tobacco Use  . Smoking status: Never Smoker  . Smokeless tobacco: Never Used  Substance and Sexual Activity  . Alcohol use: No  . Drug use: No  . Sexual activity: Yes    Birth control/protection: None, Condom  Lifestyle  . Physical activity:    Days per week: Not on file    Minutes per session: Not on file  . Stress: Only a  little  Relationships  . Social connections:    Talks on phone: Not on file    Gets together: Not on file    Attends religious service: Not on file    Active member of club or organization: Not on file    Attends meetings of clubs or organizations: Not on file    Relationship status: Not on file  Other Topics Concern  . Not on file  Social History Narrative  . Not on file    Family History: Family History  Problem Relation Age of Onset  . Diabetes Mother     Allergies: No Known Allergies  (Not in a hospital admission)       Review of Systems   Constitutional: Negative for fever and chills Eyes: Negative for visual disturbances Respiratory: Negative for shortness of breath, dyspnea Cardiovascular: Negative for chest pain or palpitations  Gastrointestinal: Negative for abdominal pain, vomiting, diarrhea and constipation.   Genitourinary: Negative for dysuria and urgency Musculoskeletal: Negative for back pain, joint pain, myalgias  Neurological: Negative for dizziness  and headaches   Last menstrual period 05/02/2018, currently breastfeeding. General appearance: alert, cooperative and no distress Lungs: normal respiratory effort Heart: regular rate and rhythm Abdomen: soft, non-tender; bowel sounds normal Extremities: Homans sign is negative, no sign of DVT DTR's 2+ Presentation: cephalic Fetal monitoring  Baseline: 140 bpm, Variability: Good {> 6 bpm), Accelerations: Reactive and Decelerations: Absent Uterine activity  None      Prenatal labs: ABO, Rh: --/--/A POS, A POS Performed at [facility] [address] [place], [place] [address]  [phone]) Antibody: NEG (05/31 0043) Rubella: immune RPR: Non Reactive (01/27 1151)  HBsAg: Negative (01/27 1151)  HIV: Non Reactive (01/27 1151)  GBS: Negative (05/05 0921)   Nursing Staff Provider  Office Location  [facility]  Dating   LMP  Language  English  Anatomy US  girl  Flu Vaccine  10-23-18 Genetic  Screen  NIPS: low risk      TDaP vaccine   10-23-18 Hgb A1C or  GTT Early  83/163/154 @ 21 weeks   Rhogam   n/a   LAB RESULTS   Feeding Plan BOTH  Blood Type A/Positive/-- (01/27 1151)   Contraception Unsure Antibody Negative (01/27 1151)  Circumcision Girl  Rubella 5.10 (01/27 1151)  Pediatrician  [facility]  RPR Non Reactive (01/27 1151)   Support Person AKOSILE FOB HBsAg Negative (01/27 1151)   Prenatal Classes NO HIV Non Reactive (01/27 1151)  BTL Consent  GBS Negative (05/05 0921)(For PCN allergy, check sensitivities)   VBAC Consent  Pap  02/2018 normal    Hgb Electro   sickle cell trait    CF  negative    SMA 2    Waterbirth  [ ]  Class [ ]  Consent [ ]  CNM visit      Prenatal Transfer Tool  Maternal Diabetes: Yes:  Diabetes Type:  Diet controlled Genetic Screening: Normal Maternal Ultrasounds/Referrals: Normal Fetal Ultrasounds or other Referrals:  None Maternal Substance Abuse:  No Significant Maternal Medications:  None Significant Maternal Lab Results: Lab values include: Group B Strep negative    Results for orders placed or performed during the hospital encounter of 01/11/19 (from the past 24 hour(s))  CBC   Collection Time: 01/11/19 12:43 AM  Result Value Ref Range   WBC 6.2 4.0 - 10.5 K/uL   RBC 5.86 (H) 3.87 - 5.11 MIL/uL   Hemoglobin 13.0 12.0 - 15.0 g/dL   HCT 39.7 36.0 - 46.0 %   MCV 67.7 (L) 80.0 - 100.0 fL   MCH 22.2 (L) 26.0 - 34.0 pg   MCHC 32.7 30.0 - 36.0 g/dL   RDW 14.6 11.5 - 15.5 %   Platelets 225 150 - 400 K/uL   nRBC 0.0 0.0 - 0.2 %  Type and screen   Collection Time: 01/11/19 12:43 AM  Result Value Ref Range   ABO/RH(D) A POS    Antibody Screen NEG    Sample Expiration      01/14/2019,2359 Performed at [facility], [place] [address], [place], [place] [address]   ABO/Rh   Collection Time: 01/11/19 12:43 AM  Result Value Ref Range   ABO/RH(D)      A POS Performed at [facility] [address] [place],  [place] [address]     Assessment: Kianna Billet is a 43 y.o. [phone] with an IUP at [redacted]w[redacted]d presenting for IOL for AMA, A1DM.  Plan: #Labor: Cytotec->Foley->pitocin #Pain:  Per request #FWB Cat 1   Christin Fudge  01/11/2019, 3:18 AM

## 2019-01-11 ENCOUNTER — Inpatient Hospital Stay (HOSPITAL_COMMUNITY): Payer: BLUE CROSS/BLUE SHIELD | Admitting: Anesthesiology

## 2019-01-11 ENCOUNTER — Inpatient Hospital Stay (HOSPITAL_COMMUNITY)
Admission: AD | Admit: 2019-01-11 | Discharge: 2019-01-13 | DRG: 807 | Disposition: A | Discharge status: Home / Self Care | Payer: BLUE CROSS/BLUE SHIELD | Attending: Family Medicine | Admitting: Family Medicine

## 2019-01-11 ENCOUNTER — Encounter (HOSPITAL_COMMUNITY): Payer: Self-pay

## 2019-01-11 ENCOUNTER — Other Ambulatory Visit: Payer: Self-pay

## 2019-01-11 ENCOUNTER — Inpatient Hospital Stay (HOSPITAL_COMMUNITY): Payer: BLUE CROSS/BLUE SHIELD

## 2019-01-11 DIAGNOSIS — O09299 Supervision of pregnancy with other poor reproductive or obstetric history, unspecified trimester: Secondary | ICD-10-CM

## 2019-01-11 DIAGNOSIS — O9902 Anemia complicating childbirth: Secondary | ICD-10-CM | POA: Diagnosis present

## 2019-01-11 DIAGNOSIS — O24420 Gestational diabetes mellitus in childbirth, diet controlled: Principal | ICD-10-CM | POA: Diagnosis present

## 2019-01-11 DIAGNOSIS — D573 Sickle-cell trait: Secondary | ICD-10-CM | POA: Diagnosis present

## 2019-01-11 DIAGNOSIS — Z3490 Encounter for supervision of normal pregnancy, unspecified, unspecified trimester: Secondary | ICD-10-CM | POA: Diagnosis present

## 2019-01-11 DIAGNOSIS — O09523 Supervision of elderly multigravida, third trimester: Secondary | ICD-10-CM

## 2019-01-11 DIAGNOSIS — Z3A39 39 weeks gestation of pregnancy: Secondary | ICD-10-CM

## 2019-01-11 DIAGNOSIS — O99214 Obesity complicating childbirth: Secondary | ICD-10-CM | POA: Diagnosis present

## 2019-01-11 DIAGNOSIS — J45909 Unspecified asthma, uncomplicated: Secondary | ICD-10-CM | POA: Diagnosis present

## 2019-01-11 DIAGNOSIS — O9952 Diseases of the respiratory system complicating childbirth: Secondary | ICD-10-CM | POA: Diagnosis present

## 2019-01-11 DIAGNOSIS — O24410 Gestational diabetes mellitus in pregnancy, diet controlled: Secondary | ICD-10-CM

## 2019-01-11 DIAGNOSIS — O09529 Supervision of elderly multigravida, unspecified trimester: Secondary | ICD-10-CM

## 2019-01-11 DIAGNOSIS — O24419 Gestational diabetes mellitus in pregnancy, unspecified control: Secondary | ICD-10-CM | POA: Diagnosis present

## 2019-01-11 DIAGNOSIS — O99210 Obesity complicating pregnancy, unspecified trimester: Secondary | ICD-10-CM | POA: Diagnosis present

## 2019-01-11 DIAGNOSIS — Z8632 Personal history of gestational diabetes: Secondary | ICD-10-CM

## 2019-01-11 LAB — CBC
HCT: 39.7 % (ref 36.0–46.0)
Hemoglobin: 13 g/dL (ref 12.0–15.0)
MCH: 22.2 pg — ABNORMAL LOW (ref 26.0–34.0)
MCHC: 32.7 g/dL (ref 30.0–36.0)
MCV: 67.7 fL — ABNORMAL LOW (ref 80.0–100.0)
Platelets: 225 10*3/uL (ref 150–400)
RBC: 5.86 MIL/uL — ABNORMAL HIGH (ref 3.87–5.11)
RDW: 14.6 % (ref 11.5–15.5)
WBC: 6.2 10*3/uL (ref 4.0–10.5)
nRBC: 0 % (ref 0.0–0.2)

## 2019-01-11 LAB — GLUCOSE, CAPILLARY
Glucose-Capillary: 102 mg/dL — ABNORMAL HIGH (ref 70–99)
Glucose-Capillary: 91 mg/dL (ref 70–99)
Glucose-Capillary: 86 mg/dL (ref 70–99)
Glucose-Capillary: 87 mg/dL (ref 70–99)

## 2019-01-11 LAB — TYPE AND SCREEN
ABO/RH(D): A POS
Antibody Screen: NEGATIVE

## 2019-01-11 LAB — RPR: RPR Ser Ql: NONREACTIVE

## 2019-01-11 MED ORDER — FENTANYL CITRATE (PF) 100 MCG/2ML IJ SOLN
100.0000 ug | Freq: Once | INTRAMUSCULAR | Status: AC
  Administered 2019-01-11: 100 ug via INTRAVENOUS
  Filled 2019-01-11: qty 2

## 2019-01-11 MED ORDER — ZOLPIDEM TARTRATE 5 MG PO TABS: 5.0000 mg | ORAL_TABLET | Freq: Every evening | ORAL | Status: DC | PRN

## 2019-01-11 MED ORDER — OXYTOCIN 40 UNITS IN NORMAL SALINE INFUSION - SIMPLE MED
2.5000 [IU]/h | INTRAVENOUS | Status: DC
  Filled 2019-01-11: qty 1000

## 2019-01-11 MED ORDER — FENTANYL-BUPIVACAINE-NACL 0.5-0.125-0.9 MG/250ML-% EP SOLN
12.0000 mL/h | EPIDURAL | Status: DC | PRN
  Filled 2019-01-11: qty 250

## 2019-01-11 MED ORDER — SOD CITRATE-CITRIC ACID 500-334 MG/5ML PO SOLN: 30.0000 mL | ORAL | Status: DC | PRN

## 2019-01-11 MED ORDER — DIPHENHYDRAMINE HCL 25 MG PO CAPS
25.0000 mg | ORAL_CAPSULE | Freq: Four times a day (QID) | ORAL | Status: DC | PRN
  Administered 2019-01-12 – 2019-01-13 (×2): 25 mg via ORAL
  Filled 2019-01-11 (×2): qty 1

## 2019-01-11 MED ORDER — WITCH HAZEL-GLYCERIN EX PADS: 1.0000 "application " | MEDICATED_PAD | CUTANEOUS | Status: DC | PRN

## 2019-01-11 MED ORDER — ONDANSETRON HCL 4 MG PO TABS: 4.0000 mg | ORAL_TABLET | ORAL | Status: DC | PRN

## 2019-01-11 MED ORDER — LACTATED RINGERS IV SOLN
INTRAVENOUS | Status: DC
  Administered 2019-01-11 (×3): via INTRAVENOUS

## 2019-01-11 MED ORDER — OXYTOCIN BOLUS FROM INFUSION
500.0000 mL | Freq: Once | INTRAVENOUS | Status: AC
  Administered 2019-01-11: 500 mL via INTRAVENOUS

## 2019-01-11 MED ORDER — LACTATED RINGERS IV SOLN: 500.0000 mL | INTRAVENOUS | Status: DC | PRN

## 2019-01-11 MED ORDER — ONDANSETRON HCL 4 MG/2ML IJ SOLN: 4.0000 mg | Freq: Four times a day (QID) | INTRAMUSCULAR | Status: DC | PRN

## 2019-01-11 MED ORDER — OXYCODONE-ACETAMINOPHEN 5-325 MG PO TABS: 1.0000 | ORAL_TABLET | ORAL | Status: DC | PRN

## 2019-01-11 MED ORDER — PHENYLEPHRINE 40 MCG/ML (10ML) SYRINGE FOR IV PUSH (FOR BLOOD PRESSURE SUPPORT): 80.0000 ug | PREFILLED_SYRINGE | INTRAVENOUS | Status: DC | PRN

## 2019-01-11 MED ORDER — DIBUCAINE (PERIANAL) 1 % EX OINT: 1.0000 "application " | TOPICAL_OINTMENT | CUTANEOUS | Status: DC | PRN

## 2019-01-11 MED ORDER — PHENYLEPHRINE 40 MCG/ML (10ML) SYRINGE FOR IV PUSH (FOR BLOOD PRESSURE SUPPORT)
80.0000 ug | PREFILLED_SYRINGE | INTRAVENOUS | Status: DC | PRN
  Filled 2019-01-11: qty 10

## 2019-01-11 MED ORDER — ACETAMINOPHEN 325 MG PO TABS
650.0000 mg | ORAL_TABLET | ORAL | Status: DC | PRN
  Administered 2019-01-12: 650 mg via ORAL
  Filled 2019-01-11: qty 2

## 2019-01-11 MED ORDER — LIDOCAINE HCL (PF) 1 % IJ SOLN
INTRAMUSCULAR | Status: DC | PRN
  Administered 2019-01-11: 5 mL via EPIDURAL

## 2019-01-11 MED ORDER — EPHEDRINE 5 MG/ML INJ: 10.0000 mg | INTRAVENOUS | Status: DC | PRN

## 2019-01-11 MED ORDER — COCONUT OIL OIL: 1.0000 "application " | TOPICAL_OIL | Status: DC | PRN

## 2019-01-11 MED ORDER — BENZOCAINE-MENTHOL 20-0.5 % EX AERO
1.0000 "application " | INHALATION_SPRAY | CUTANEOUS | Status: DC | PRN
  Administered 2019-01-12 – 2019-01-13 (×2): 1 via TOPICAL
  Filled 2019-01-11 (×2): qty 56

## 2019-01-11 MED ORDER — TETANUS-DIPHTH-ACELL PERTUSSIS 5-2.5-18.5 LF-MCG/0.5 IM SUSP: 0.5000 mL | Freq: Once | INTRAMUSCULAR | Status: DC

## 2019-01-11 MED ORDER — SENNOSIDES-DOCUSATE SODIUM 8.6-50 MG PO TABS
2.0000 | ORAL_TABLET | ORAL | Status: DC
  Administered 2019-01-11 – 2019-01-12 (×2): 2 via ORAL
  Filled 2019-01-11 (×2): qty 2

## 2019-01-11 MED ORDER — MISOPROSTOL 50MCG HALF TABLET
50.0000 ug | ORAL_TABLET | ORAL | Status: DC
  Administered 2019-01-11 (×2): 50 ug via BUCCAL
  Filled 2019-01-11 (×2): qty 1

## 2019-01-11 MED ORDER — ONDANSETRON HCL 4 MG/2ML IJ SOLN: 4.0000 mg | INTRAMUSCULAR | Status: DC | PRN

## 2019-01-11 MED ORDER — DIPHENHYDRAMINE HCL 50 MG/ML IJ SOLN: 12.5000 mg | INTRAMUSCULAR | Status: DC | PRN

## 2019-01-11 MED ORDER — FENTANYL CITRATE (PF) 100 MCG/2ML IJ SOLN: 100.0000 ug | INTRAMUSCULAR | Status: DC | PRN

## 2019-01-11 MED ORDER — SIMETHICONE 80 MG PO CHEW: 80.0000 mg | CHEWABLE_TABLET | ORAL | Status: DC | PRN

## 2019-01-11 MED ORDER — IBUPROFEN 600 MG PO TABS
600.0000 mg | ORAL_TABLET | Freq: Four times a day (QID) | ORAL | Status: DC
  Administered 2019-01-11 – 2019-01-13 (×7): 600 mg via ORAL
  Filled 2019-01-11 (×7): qty 1

## 2019-01-11 MED ORDER — PRENATAL MULTIVITAMIN CH
1.0000 | ORAL_TABLET | Freq: Every day | ORAL | Status: DC
  Administered 2019-01-12 – 2019-01-13 (×2): 1 via ORAL
  Filled 2019-01-11 (×3): qty 1

## 2019-01-11 MED ORDER — MISOPROSTOL 25 MCG QUARTER TABLET
25.0000 ug | ORAL_TABLET | ORAL | Status: DC
  Administered 2019-01-11 (×2): 25 ug via VAGINAL
  Filled 2019-01-11 (×2): qty 1

## 2019-01-11 MED ORDER — ACETAMINOPHEN 325 MG PO TABS: 650.0000 mg | ORAL_TABLET | ORAL | Status: DC | PRN

## 2019-01-11 MED ORDER — LACTATED RINGERS IV SOLN
500.0000 mL | Freq: Once | INTRAVENOUS | Status: AC
  Administered 2019-01-11: 500 mL via INTRAVENOUS

## 2019-01-11 MED ORDER — OXYCODONE-ACETAMINOPHEN 5-325 MG PO TABS: 2.0000 | ORAL_TABLET | ORAL | Status: DC | PRN

## 2019-01-11 MED ORDER — LIDOCAINE HCL (PF) 1 % IJ SOLN
30.0000 mL | INTRAMUSCULAR | Status: AC | PRN
  Administered 2019-01-11: 30 mL via SUBCUTANEOUS
  Filled 2019-01-11: qty 30

## 2019-01-11 MED ORDER — SODIUM CHLORIDE (PF) 0.9 % IJ SOLN
INTRAMUSCULAR | Status: DC | PRN
  Administered 2019-01-11: 12 mL/h via EPIDURAL

## 2019-01-11 MED ORDER — MEASLES, MUMPS & RUBELLA VAC IJ SOLR: 0.5000 mL | Freq: Once | INTRAMUSCULAR | Status: DC

## 2019-01-11 MED ORDER — MISOPROSTOL 50MCG HALF TABLET: 50.0000 ug | ORAL_TABLET | ORAL | Status: DC

## 2019-01-11 NOTE — Anesthesia Preprocedure Evaluation (Signed)
Anesthesia Evaluation  Patient identified by MRN, date of birth, ID band Patient awake    Reviewed: Allergy & Precautions, H&P , NPO status , Patient's Chart, lab work & pertinent test results  Airway Mallampati: II   Neck ROM: full    Dental   Pulmonary asthma ,    breath sounds clear to auscultation       Cardiovascular negative cardio ROS   Rhythm:regular Rate:Normal     Neuro/Psych    GI/Hepatic GERD  ,  Endo/Other  diabetes, GestationalMorbid obesity  Renal/GU      Musculoskeletal   Abdominal   Peds  Hematology   Anesthesia Other Findings   Reproductive/Obstetrics                             Anesthesia Physical Anesthesia Plan  ASA: II  Anesthesia Plan: Epidural   Post-op Pain Management:    Induction: Intravenous  PONV Risk Score and Plan: 2 and Treatment may vary due to age or medical condition  Airway Management Planned: Natural Airway  Additional Equipment:   Intra-op Plan:   Post-operative Plan:   Informed Consent: I have reviewed the patients History and Physical, chart, labs and discussed the procedure including the risks, benefits and alternatives for the proposed anesthesia with the patient or authorized representative who has indicated his/her understanding and acceptance.       Plan Discussed with: Anesthesiologist  Anesthesia Plan Comments:         Anesthesia Quick Evaluation

## 2019-01-11 NOTE — Progress Notes (Signed)
LABOR PROGRESS NOTE  Cheryl Acosta is a 43 y.o. H6D1497 at [redacted]w[redacted]d  admitted for IOL for AMA and A1GDM.   Subjective: Patient very nervous about feeling any pain. Requests IV Fentanyl for cervical exam and possible FB placement. Requests to have epidural prior to starting Pitocin.   Objective: BP 137/86   Pulse 77   Temp 97.6 F (36.4 C) (Oral)   Resp 20   Ht 5\' 7"  (1.702 m)   Wt (!) 143.3 kg   LMP 05/02/2018   BMI 49.49 kg/m  or  Vitals:   01/11/19 0731 01/11/19 0837 01/11/19 0935 01/11/19 1030  BP: 120/73 126/66 126/62 137/86  Pulse: 74 77 77 77  Resp: 20 20 18 20   Temp: 97.6 F (36.4 C)     TempSrc: Oral     Weight:      Height:       Dilation: 1.5 Effacement (%): 40 Cervical Position: Anterior  Station: -2 Presentation: Vertex Exam by:: Dr. Juleen China FHT: baseline rate 130, moderate varibility, +acel, no decel Toco: q4-6 min   Labs: Lab Results  Component Value Date   WBC 6.2 01/11/2019   HGB 13.0 01/11/2019   HCT 39.7 01/11/2019   MCV 67.7 (L) 01/11/2019   PLT 225 01/11/2019    Patient Active Problem List   Diagnosis Date Noted  . Encounter for induction of labor 01/11/2019  . Gestational diabetes mellitus (GDM) 09/29/2018  . Encounter for supervision of high risk multigravida of advanced maternal age, antepartum 09/08/2018  . H/O gestational diabetes in prior pregnancy, currently pregnant 09/08/2018  . Obesity affecting pregnancy 09/08/2018  . Sickle cell trait ([place]) 09/08/2018  . Asthma 09/08/2018  . Breast mass 09/08/2018  . AMA (advanced maternal age) multigravida 20+ 10/08/2016    Assessment / Plan: 43 y.o. [id number] at [redacted]w[redacted]d here for IOL for AMA and A1GDM.   Labor: Patient s/p cytotec x2. On exam, introitus narrow and patient very resistant to cervical exam. Cervix is very anterior and thick. Did not attempt FB placement due to difficult exam. Will change cytotec to buccal 50 mg.  Fetal Wellbeing:  Cat I  Pain Control:  Epidural prior to starting  Pitocin  Anticipated MOD:  NSVD   Phill Myron, D.O. OB Fellow  01/11/2019, 10:47 AM

## 2019-01-11 NOTE — Anesthesia Procedure Notes (Signed)
Epidural Patient location during procedure: OB Start time: 01/11/2019 5:45 PM End time: 01/11/2019 5:57 PM  Staffing Anesthesiologist: Albertha Ghee, MD Performed: anesthesiologist   Preanesthetic Checklist Completed: patient identified, site marked, pre-op evaluation, timeout performed, IV checked, risks and benefits discussed and monitors and equipment checked  Epidural Patient position: sitting Prep: DuraPrep Patient monitoring: heart rate, cardiac monitor, continuous pulse ox and blood pressure Approach: midline Location: L3-L4 Injection technique: LOR saline  Needle:  Needle type: Tuohy  Needle gauge: 17 G Needle length: 9 cm Needle insertion depth: 8 cm Catheter type: closed end flexible Catheter size: 19 Gauge Catheter at skin depth: 14 cm Test dose: negative and Other  Assessment Events: blood not aspirated, injection not painful, no injection resistance and negative IV test  Additional Notes Informed consent obtained prior to proceeding including risk of failure, 1% risk of PDPH, risk of minor discomfort and bruising.  Discussed rare but serious complications including epidural abscess, permanent nerve injury, epidural hematoma.  Discussed alternatives to epidural analgesia and patient desires to proceed.  Timeout performed pre-procedure verifying patient name, procedure, and platelet count.  Patient tolerated procedure well. Reason for block:procedure for pain

## 2019-01-11 NOTE — Discharge Summary (Addendum)
OB Discharge Summary     Patient Name: Cheryl Acosta DOB: 22-Sep-1975 MRN: [id number]  Date of admission: 01/11/2019 Delivering MD: Cheryl Acosta   Date of discharge: 01/13/2019  Admitting diagnosis: preg Intrauterine pregnancy: [redacted]w[redacted]d    Secondary diagnosis:  Active Problems:   AMA (advanced maternal age) multigravida 4104+  H/O gestational diabetes in prior pregnancy, currently pregnant   Obesity affecting pregnancy   [place]   Asthma   Gestational diabetes mellitus (GDM)   Encounter for induction of labor   SVD (spontaneous vaginal delivery)  Additional problems: none     Discharge diagnosis: Term Pregnancy Delivered and GDM A1                                                                                                Post partum procedures:none  Augmentation: AROM and Cytotec  Complications: None  Hospital course:  Induction of Labor With Vaginal Delivery   43y.o. yo [id number] [id number] admitted to the hospital 01/11/2019 for induction of labor.  Indication for induction: A1 DM and AMA.  Patient had an uncomplicated labor course as follows: Membrane Rupture Time/Date: 6:38 PM ,01/11/2019   Intrapartum Procedures: Episiotomy: None [1]                                         Lacerations:  2nd degree [3];Perineal [11]  Patient had delivery of a Viable infant.  Information for the patient's newborn:  Cheryl Acosta, Kindel0[[id number]]Delivery Method: Vaginal, Spontaneous(Filed from Delivery Summary)   01/11/2019  Details of delivery can be found in separate delivery note.  Patient had a routine postpartum course. Patient is discharged home 01/13/19.  Physical exam  Vitals:   01/12/19 0930 01/12/19 1500 01/12/19 2053 01/13/19 0553  BP: 118/60 119/63 103/65 128/81  Pulse: 64 62 67 (!) 59  Resp: '18 18 16 16  '$ Temp: 97.6 F (36.4 C) 97.7 F (36.5 C) 97.6 F (36.4 C) 98.2 F (36.8 C)  TempSrc: Oral Oral Oral Oral  SpO2:   100%   Weight:       Height:       General: alert, cooperative and no distress Lochia: appropriate Uterine Fundus: firm Incision: N/A DVT Evaluation: No evidence of DVT seen on physical exam. Negative Homan's sign. No cords or calf tenderness. No significant calf/ankle edema. Labs: Lab Results  Component Value Date   WBC 6.2 01/11/2019   HGB 13.0 01/11/2019   HCT 39.7 01/11/2019   MCV 67.7 (L) 01/11/2019   PLT 225 01/11/2019   CMP Latest Ref Rng & Units 09/08/2018  Glucose 65 - 99 mg/dL 92  BUN 6 - 24 mg/dL 7  Creatinine 0.57 - 1.00 mg/dL 0.64  Sodium 134 - 144 mmol/L 136  Potassium 3.5 - 5.2 mmol/L 4.2  Chloride 96 - 106 mmol/L 102  CO2 20 - 29 mmol/L 20  Calcium 8.7 - 10.2 mg/dL 9.3  Total Protein 6.0 - 8.5 g/dL 6.4  Total Bilirubin 0.0 - 1.2 mg/dL <0.2  Alkaline Phos 39 - 117 IU/L 50  AST 0 - 40 IU/L 6  ALT 0 - 32 IU/L 6    Discharge instruction: per After Visit Summary and "Baby and Me Booklet".  After visit meds:  Allergies as of 01/13/2019   No Known Allergies     Medication List    TAKE these medications   Accu-Chek FastClix Lancets Misc 1 Stick by Percutaneous route 4 (four) times daily.   acetaminophen 325 MG tablet Commonly known as:  Tylenol Take 2 tablets (650 mg total) by mouth every 4 (four) hours as needed (for pain scale < 4).   albuterol 108 (90 Base) MCG/ACT inhaler Commonly known as:  VENTOLIN HFA Inhale 2 puffs into the lungs every 6 (six) hours as needed for wheezing or shortness of breath.   aspirin EC 81 MG tablet Take 1 tablet (81 mg total) by mouth daily. Take after 12 weeks for prevention of preeclampsia later in pregnancy   blood glucose meter kit and supplies Dispense based on patient and insurance preference. Use up to four times daily as directed. (FOR ICD-10 E10.9, E11.9).   cetirizine 10 MG tablet Commonly known as:  ZYRTEC Take 10 mg by mouth daily as needed for allergies.   coconut oil Oil Apply 1 application topically as needed.    docusate sodium 100 MG capsule Commonly known as:  COLACE Take 1 capsule (100 mg total) by mouth 2 (two) times daily as needed. What changed:  reasons to take this   fluticasone 110 MCG/ACT inhaler Commonly known as:  FLOVENT HFA Inhale 2 puffs into the lungs 2 (two) times daily.   glucose blood test strip Check by percutaneous route 4 times daily   ibuprofen 600 MG tablet Commonly known as:  ADVIL Take 1 tablet (600 mg total) by mouth every 6 (six) hours.   montelukast 10 MG tablet Commonly known as:  SINGULAIR Take 10 mg by mouth at bedtime.   omeprazole 20 MG capsule Commonly known as:  PriLOSEC Take 1 capsule (20 mg total) by mouth 2 (two) times daily before a meal.   Symbicort 160-4.5 MCG/ACT inhaler Generic drug:  budesonide-formoterol Inhale 2 puffs into the lungs 2 (two) times daily.   Vitafol Ultra 29-0.6-0.4-200 MG Caps Take 1 tablet by mouth daily before breakfast.       Diet: carb modified diet  Activity: Advance as tolerated. Pelvic rest for 6 weeks.   Outpatient follow up:4 weeks Follow up Appt: Future Appointments  Date Time Provider [place]  02/09/2019 10:00 AM Woodroe Mode, MD [facility] None  02/23/2019  9:15 AM [facility] None  03/24/2019  9:00 AM [facility] DIAG TOMO 1 [facility] GI-BREAST [facility]  03/24/2019  9:10 AM [facility] [place] 1 [facility]   Follow up Visit:No follow-ups on file. Please schedule this patient for Postpartum visit in: 4 weeks with the following provider: Any provider For C/S patients schedule nurse incision check in weeks 2 weeks: no High risk pregnancy complicated by: AMA, M1DQQ  Delivery mode:  SVD Anticipated Birth Control:  other/unsure PP Procedures needed: 2 hour GTT  Schedule Integrated [place] visit: no  Postpartum contraception: Undecided- discussed options today. Pt previously on nuvaring which she tolerated well with no complaints. Pt also interested in longer term options and plans to discuss IUD and  nexplanon further at OP f/u.   Newborn Data: Live born female  Birth Weight:   APGAR: 9, 9  Newborn Delivery   Birth date/time:  01/11/2019 19:05:00 Delivery type:  Vaginal, Spontaneous     Baby Feeding: Bottle, Breast and no milk let down, will continue to pump and stimulate at home Disposition:home   01/13/2019 Wilber Oliphant, MD  I confirm that I have verified the information documented in the resident's note and that I have also personally reperformed the history, physical exam and all medical decision making activities of this service and have verified that all service and findings are accurately documented in this student's note.   General: alert, cooperative and no distress Lochia: appropriate Uterine Fundus: firm Incision: N/A DVT Evaluation: No evidence of DVT seen on physical exam. Negative Homan's sign. No cords or calf tenderness. No significant calf/ankle edema.  Planning outpatient LARC unsure type. Pelvic rest advised until PP visit.  PP visit scheduled 6/29 @ [facility] 2 hour PP GTT scheduled 7/13  Luvenia Redden, [place] 01/13/2019 10:00 AM

## 2019-01-11 NOTE — Progress Notes (Signed)
LABOR PROGRESS NOTE  Cheryl Acosta is a 43 y.o. [name] at [redacted]w[redacted]d  admitted for IOL for AMA and A1GDM.   Subjective: Patient comfortable with epidural. No concerns.   Objective: BP 114/65   Pulse 76   Temp 97.6 F (36.4 C) (Oral)   Resp 16   Ht 5\' 7"  (1.702 m)   Wt (!) 143.3 kg   LMP 05/02/2018   SpO2 100%   BMI 49.49 kg/m  or  Vitals:   01/11/19 1813 01/11/19 1822 01/11/19 1826 01/11/19 1831  BP: 115/64 115/67 113/66 114/65  Pulse: (!) 117 90 74 76  Resp: 18 16 16 16   Temp:      TempSrc:      SpO2:   100%   Weight:      Height:       Dilation: 6.5 Effacement (%): 80 Cervical Position: Posterior Station: -2 Presentation: Vertex Exam by:: Juleen China MD FHT: baseline rate 135, moderate varibility, +acel, variable decel Toco: q2-3 min   Labs: Lab Results  Component Value Date   WBC 6.2 01/11/2019   HGB 13.0 01/11/2019   HCT 39.7 01/11/2019   MCV 67.7 (L) 01/11/2019   PLT 225 01/11/2019    Patient Active Problem List   Diagnosis Date Noted  . Encounter for induction of labor 01/11/2019  . Gestational diabetes mellitus (GDM) 09/29/2018  . Encounter for supervision of high risk multigravida of advanced maternal age, antepartum 09/08/2018  . H/O gestational diabetes in prior pregnancy, currently pregnant 09/08/2018  . Obesity affecting pregnancy 09/08/2018  . Sickle cell trait ([place]) 09/08/2018  . Asthma 09/08/2018  . Breast mass 09/08/2018  . AMA (advanced maternal age) multigravida 41+ 10/08/2016    Assessment / Plan: 43 y.o. [id number] at [redacted]w[redacted]d here for IOL for AMA and A1GDM.   Labor: Patient s/p cytotec x4. Now contracting regularly and making steady cervical change. AROM performed ?pink tinged fluid. Bloody show is present.  Fetal Wellbeing:  Cat II although continues to have moderate variability and is likely related to quick cervical change.  Pain Control:  Epidural in place  Anticipated MOD:  NSVD   Phill Myron, D.O. OB Fellow  01/11/2019, 6:43  PM

## 2019-01-12 LAB — GLUCOSE, CAPILLARY: Glucose-Capillary: 83 mg/dL (ref 70–99)

## 2019-01-12 LAB — ABO/RH: ABO/RH(D): A POS

## 2019-01-12 NOTE — Progress Notes (Signed)
Post Partum Day 1 Subjective: Doing well, no complaints. Misses family/friends and wishes they could be with her.  Objective: Blood pressure 119/63, pulse 62, temperature 97.7 F (36.5 C), temperature source Oral, resp. rate 18, height 5\' 7"  (1.702 m), weight (!) 143.3 kg, last menstrual period 05/02/2018, SpO2 100 %, unknown if currently breastfeeding.  Physical Exam:  General: alert, well-appearing, sitting up in chair  Lochia: appropriate Uterine Fundus: firm Incision: n/a DVT Evaluation: No significant calf/ankle edema.  Recent Labs    01/11/19 0043  HGB 13.0  HCT 39.7    Assessment/Plan: Plan for discharge tomorrow  Continue routine postpartum care    LOS: 1 day   Glenice Bow, DO 01/12/2019, 6:44 PM

## 2019-01-12 NOTE — Anesthesia Postprocedure Evaluation (Signed)
Anesthesia Post Note  Patient: Cheryl Acosta  Procedure(s) Performed: AN AD [place]     Patient location during evaluation: Mother Baby Anesthesia Type: Epidural Level of consciousness: awake and alert and oriented Pain management: satisfactory to patient Vital Signs Assessment: post-procedure vital signs reviewed and stable Respiratory status: respiratory function stable Cardiovascular status: stable Postop Assessment: no headache, no backache, epidural receding, patient able to bend at knees, no signs of nausea or vomiting and adequate PO intake Anesthetic complications: no    Last Vitals:  Vitals:   01/12/19 0234 01/12/19 0501  BP: 128/72 134/87  Pulse: 67 62  Resp: 16 16  Temp: 36.7 C 36.6 C  SpO2:      Last Pain:  Vitals:   01/12/19 0502  TempSrc:   PainSc: 4    Pain Goal:                   Darold Miley

## 2019-01-12 NOTE — Lactation Note (Signed)
This note was copied from a baby's chart. Lactation Consultation Note  Patient Name: Cheryl Acosta POLID'C Date: 01/12/2019   Visited with P4 Mom of term baby at 71 hrs old.  Mom not interested in breastfeeding baby due to not feeling well.   Mom never latched her 3 other babies.  Mom pumped for 4-6 months with her other babies.  Nipple semi-flat and center of nipple inverts.  Mom states she is too uncomfortable to pump yet.  May call for assistance later.   Baby getting formula by bottle currently.   Broadus John 01/12/2019, 11:51 AM

## 2019-01-13 MED ORDER — IBUPROFEN 600 MG PO TABS: 600.0000 mg | ORAL_TABLET | Freq: Four times a day (QID) | ORAL | 0 refills | Status: DC

## 2019-01-13 MED ORDER — ACETAMINOPHEN 325 MG PO TABS: 650.0000 mg | ORAL_TABLET | ORAL | Status: DC | PRN

## 2019-01-13 MED ORDER — COCONUT OIL OIL: 1.0000 "application " | TOPICAL_OIL | 0 refills | Status: DC | PRN

## 2019-01-13 NOTE — Discharge Instructions (Signed)
Vaginal Delivery, Care After °Refer to this sheet in the next few weeks. These instructions provide you with information about caring for yourself after vaginal delivery. Your health care provider may also give you more specific instructions. Your treatment has been planned according to current medical practices, but problems sometimes occur. Call your health care provider if you have any problems or questions. °What can I expect after the procedure? °After vaginal delivery, it is common to have: °· Some bleeding from your vagina. °· Soreness in your abdomen, your vagina, and the area of skin between your vaginal opening and your anus (perineum). °· Pelvic cramps. °· Fatigue. °Follow these instructions at home: °Medicines °· Take over-the-counter and prescription medicines only as told by your health care provider. °· If you were prescribed an antibiotic medicine, take it as told by your health care provider. Do not stop taking the antibiotic until it is finished. °Driving ° °· Do not drive or operate heavy machinery while taking prescription pain medicine. °· Do not drive for 24 hours if you received a sedative. °Lifestyle °· Do not drink alcohol. This is especially important if you are breastfeeding or taking medicine to relieve pain. °· Do not use tobacco products, including cigarettes, chewing tobacco, or e-cigarettes. If you need help quitting, ask your health care provider. °Eating and drinking °· Drink at least 8 eight-ounce glasses of water every day unless you are told not to by your health care provider. If you choose to breastfeed your baby, you may need to drink more water than this. °· Eat high-fiber foods every day. These foods may help prevent or relieve constipation. High-fiber foods include: °? Whole grain cereals and breads. °? Brown rice. °? Beans. °? Fresh fruits and vegetables. °Activity °· Return to your normal activities as told by your health care provider. Ask your health care provider what  activities are safe for you. °· Rest as much as possible. Try to rest or take a nap when your baby is sleeping. °· Do not lift anything that is heavier than your baby or 10 lb (4.5 kg) until your health care provider says that it is safe. °· Talk with your health care provider about when you can engage in sexual activity. This may depend on your: °? Risk of infection. °? Rate of healing. °? Comfort and desire to engage in sexual activity. °Vaginal Care °· If you have an episiotomy or a vaginal tear, check the area every day for signs of infection. Check for: °? More redness, swelling, or pain. °? More fluid or blood. °? Warmth. °? Pus or a bad smell. °· Do not use tampons or douches until your health care provider says this is safe. °· Watch for any blood clots that may pass from your vagina. These may look like clumps of dark red, brown, or black discharge. °General instructions °· Keep your perineum clean and dry as told by your health care provider. °· Wear loose, comfortable clothing. °· Wipe from front to back when you use the toilet. °· Ask your health care provider if you can shower or take a bath. If you had an episiotomy or a perineal tear during labor and delivery, your health care provider may tell you not to take baths for a certain length of time. °· Wear a bra that supports your breasts and fits you well. °· If possible, have someone help you with household activities and help care for your baby for at least a few days after you   leave the hospital. °· Keep all follow-up visits for you and your baby as told by your health care provider. This is important. °Contact a health care provider if: °· You have: °? Vaginal discharge that has a bad smell. °? Difficulty urinating. °? Pain when urinating. °? A sudden increase or decrease in the frequency of your bowel movements. °? More redness, swelling, or pain around your episiotomy or vaginal tear. °? More fluid or blood coming from your episiotomy or vaginal  tear. °? Pus or a bad smell coming from your episiotomy or vaginal tear. °? A fever. °? A rash. °? Little or no interest in activities you used to enjoy. °? Questions about caring for yourself or your baby. °· Your episiotomy or vaginal tear feels warm to the touch. °· Your episiotomy or vaginal tear is separating or does not appear to be healing. °· Your breasts are painful, hard, or turn red. °· You feel unusually sad or worried. °· You feel nauseous or you vomit. °· You pass large blood clots from your vagina. If you pass a blood clot from your vagina, save it to show to your health care provider. Do not flush blood clots down the toilet without having your health care provider look at them. °· You urinate more than usual. °· You are dizzy or light-headed. °· You have not breastfed at all and you have not had a menstrual period for 12 weeks after delivery. °· You have stopped breastfeeding and you have not had a menstrual period for 12 weeks after you stopped breastfeeding. °Get help right away if: °· You have: °? Pain that does not go away or does not get better with medicine. °? Chest pain. °? Difficulty breathing. °? Blurred vision or spots in your vision. °? Thoughts about hurting yourself or your baby. °· You develop pain in your abdomen or in one of your legs. °· You develop a severe headache. °· You faint. °· You bleed from your vagina so much that you fill two sanitary pads in one hour. °This information is not intended to replace advice given to you by your health care provider. Make sure you discuss any questions you have with your health care provider. °Document Released: 07/27/2000 Document Revised: 01/11/2016 Document Reviewed: 08/14/2015 °Elsevier Interactive Patient Education © 2019 [employer] ° °

## 2019-01-13 NOTE — Lactation Note (Signed)
This note was copied from a baby's chart. Lactation Consultation Note  Patient Name: Cheryl Acosta GOTLX'B Date: 01/13/2019 Reason for consult: Follow-up assessment;Infant weight loss(4% weight loss )  Baby is 40 hours old  Per mom feeding bottle due to " no milk yet " and my nipples are inverted and I just pump  And bottle feed .  LC reviewed supply and demand and the importance of the 1st 2 weeks of breast feeding,  And consistent pumping 8-10 times a day for 15 - 20 mins both breast together.  Consider hand expressing before and after to increase volume.  Sore nipple and engorgement prevention and tx reviewed.  LC stressed with mom its easier to prevent and than to deal with it when it happens.  LC reviewed S/S's of mastitis and LC resources after D/C for [place]/ pamphlet and Pointed out the phone number.  Per mom has a DEBP at home.   Maternal Data    Feeding Feeding Type: (baby last fed at 1048 ) Nipple Type: Slow - flow  LATCH Score                   Interventions Interventions: Breast feeding basics reviewed  Lactation Tools Discussed/Used     Consult Status Consult Status: Complete Date: 01/13/19    Cheryl Acosta 01/13/2019, 11:23 AM

## 2019-01-16 NOTE — Telephone Encounter (Signed)
It takes time after delivery for the fluid to diurese naturally.  Give the body 2 weeks to eliminate the fluid naturally.

## 2019-01-19 ENCOUNTER — Observation Stay (HOSPITAL_BASED_OUTPATIENT_CLINIC_OR_DEPARTMENT_OTHER)
Admission: EM | Admit: 2019-01-19 | Discharge: 2019-01-20 | Disposition: A | Discharge status: Home / Self Care | Payer: BC Managed Care – PPO | Attending: Obstetrics and Gynecology | Admitting: Obstetrics and Gynecology

## 2019-01-19 ENCOUNTER — Encounter (HOSPITAL_BASED_OUTPATIENT_CLINIC_OR_DEPARTMENT_OTHER): Payer: Self-pay

## 2019-01-19 ENCOUNTER — Emergency Department (HOSPITAL_BASED_OUTPATIENT_CLINIC_OR_DEPARTMENT_OTHER): Payer: BC Managed Care – PPO

## 2019-01-19 ENCOUNTER — Other Ambulatory Visit: Payer: Self-pay

## 2019-01-19 ENCOUNTER — Inpatient Hospital Stay (HOSPITAL_COMMUNITY)
Admission: AD | Admit: 2019-01-19 | Payer: BC Managed Care – PPO | Source: Ambulatory Visit | Admitting: Obstetrics and Gynecology

## 2019-01-19 DIAGNOSIS — Z7982 Long term (current) use of aspirin: Secondary | ICD-10-CM | POA: Diagnosis not present

## 2019-01-19 DIAGNOSIS — R519 Headache, unspecified: Secondary | ICD-10-CM

## 2019-01-19 DIAGNOSIS — D573 Sickle-cell trait: Secondary | ICD-10-CM | POA: Insufficient documentation

## 2019-01-19 DIAGNOSIS — R0602 Shortness of breath: Secondary | ICD-10-CM | POA: Diagnosis present

## 2019-01-19 DIAGNOSIS — Z1159 Encounter for screening for other viral diseases: Secondary | ICD-10-CM | POA: Diagnosis not present

## 2019-01-19 DIAGNOSIS — Z79899 Other long term (current) drug therapy: Secondary | ICD-10-CM | POA: Insufficient documentation

## 2019-01-19 DIAGNOSIS — D3502 Benign neoplasm of left adrenal gland: Secondary | ICD-10-CM | POA: Insufficient documentation

## 2019-01-19 DIAGNOSIS — Z7951 Long term (current) use of inhaled steroids: Secondary | ICD-10-CM | POA: Insufficient documentation

## 2019-01-19 DIAGNOSIS — O9963 Diseases of the digestive system complicating the puerperium: Secondary | ICD-10-CM | POA: Insufficient documentation

## 2019-01-19 DIAGNOSIS — O1495 Unspecified pre-eclampsia, complicating the puerperium: Principal | ICD-10-CM | POA: Diagnosis present

## 2019-01-19 DIAGNOSIS — O9953 Diseases of the respiratory system complicating the puerperium: Secondary | ICD-10-CM | POA: Diagnosis not present

## 2019-01-19 DIAGNOSIS — K219 Gastro-esophageal reflux disease without esophagitis: Secondary | ICD-10-CM | POA: Insufficient documentation

## 2019-01-19 DIAGNOSIS — O1490 Unspecified pre-eclampsia, unspecified trimester: Secondary | ICD-10-CM | POA: Diagnosis present

## 2019-01-19 DIAGNOSIS — J45909 Unspecified asthma, uncomplicated: Secondary | ICD-10-CM | POA: Insufficient documentation

## 2019-01-19 DIAGNOSIS — R51 Headache: Secondary | ICD-10-CM | POA: Diagnosis present

## 2019-01-19 LAB — CBC WITH DIFFERENTIAL/PLATELET
Abs Immature Granulocytes: 0.02 10*3/uL (ref 0.00–0.07)
Basophils Absolute: 0 10*3/uL (ref 0.0–0.1)
Basophils Relative: 1 %
Eosinophils Absolute: 0.2 10*3/uL (ref 0.0–0.5)
Eosinophils Relative: 4 %
HCT: 39.3 % (ref 36.0–46.0)
Hemoglobin: 12.4 g/dL (ref 12.0–15.0)
Immature Granulocytes: 0 %
Lymphocytes Relative: 25 %
Lymphs Abs: 1.5 10*3/uL (ref 0.7–4.0)
MCH: 22.1 pg — ABNORMAL LOW (ref 26.0–34.0)
MCHC: 31.6 g/dL (ref 30.0–36.0)
MCV: 70.1 fL — ABNORMAL LOW (ref 80.0–100.0)
Monocytes Absolute: 0.5 10*3/uL (ref 0.1–1.0)
Monocytes Relative: 7 %
Neutro Abs: 4 10*3/uL (ref 1.7–7.7)
Neutrophils Relative %: 63 %
Platelets: 258 10*3/uL (ref 150–400)
RBC: 5.61 MIL/uL — ABNORMAL HIGH (ref 3.87–5.11)
RDW: 14.6 % (ref 11.5–15.5)
Smear Review: NORMAL
WBC: 6.3 10*3/uL (ref 4.0–10.5)
nRBC: 0 % (ref 0.0–0.2)

## 2019-01-19 LAB — COMPREHENSIVE METABOLIC PANEL
ALT: 37 U/L (ref 0–44)
AST: 25 U/L (ref 15–41)
Albumin: 3.4 g/dL — ABNORMAL LOW (ref 3.5–5.0)
Alkaline Phosphatase: 67 U/L (ref 38–126)
Anion gap: 7 (ref 5–15)
BUN: 10 mg/dL (ref 6–20)
CO2: 23 mmol/L (ref 22–32)
Calcium: 8.7 mg/dL — ABNORMAL LOW (ref 8.9–10.3)
Chloride: 107 mmol/L (ref 98–111)
Creatinine, Ser: 0.72 mg/dL (ref 0.44–1.00)
GFR calc Af Amer: >60 mL/min (ref >60)
GFR calc non Af Amer: >60 mL/min (ref >60)
Glucose, Bld: 97 mg/dL (ref 70–99)
Potassium: 3.8 mmol/L (ref 3.5–5.1)
Sodium: 137 mmol/L (ref 135–145)
Total Bilirubin: 0.4 mg/dL (ref 0.3–1.2)
Total Protein: 6.5 g/dL (ref 6.5–8.1)

## 2019-01-19 LAB — URINALYSIS, ROUTINE W REFLEX MICROSCOPIC
Bilirubin Urine: NEGATIVE
Glucose, UA: NEGATIVE mg/dL
Ketones, ur: NEGATIVE mg/dL
Nitrite: NEGATIVE
Protein, ur: NEGATIVE mg/dL
Specific Gravity, Urine: 1.015 (ref 1.005–1.030)
pH: 7.5 (ref 5.0–8.0)

## 2019-01-19 LAB — URINALYSIS, MICROSCOPIC (REFLEX)

## 2019-01-19 LAB — SARS CORONAVIRUS 2 AG (30 MIN TAT): SARS Coronavirus 2 Ag: NEGATIVE

## 2019-01-19 MED ORDER — MAGNESIUM SULFATE 40 G IN LACTATED RINGERS - SIMPLE
2.0000 g/h | INTRAVENOUS | Status: AC
  Administered 2019-01-19: 22:00:00 2 g/h via INTRAVENOUS

## 2019-01-19 MED ORDER — OXYCODONE HCL 5 MG PO TABS
5.0000 mg | ORAL_TABLET | ORAL | Status: DC | PRN
  Administered 2019-01-19 – 2019-01-20 (×2): 5 mg via ORAL
  Filled 2019-01-19 (×2): qty 1

## 2019-01-19 MED ORDER — MAGNESIUM SULFATE 40 G IN LACTATED RINGERS - SIMPLE
INTRAVENOUS | Status: AC
  Filled 2019-01-19: qty 500

## 2019-01-19 MED ORDER — LABETALOL HCL 5 MG/ML IV SOLN: 40.0000 mg | INTRAVENOUS | Status: DC | PRN

## 2019-01-19 MED ORDER — SIMETHICONE 80 MG PO CHEW: 80.0000 mg | CHEWABLE_TABLET | Freq: Four times a day (QID) | ORAL | Status: DC | PRN

## 2019-01-19 MED ORDER — STERILE WATER FOR INJECTION IV SOLN
INTRAVENOUS | Status: DC
  Administered 2019-01-19 (×2): via INTRAVENOUS
  Filled 2019-01-19 (×4): qty 50

## 2019-01-19 MED ORDER — LACTATED RINGERS IV SOLN
INTRAVENOUS | Status: DC
  Administered 2019-01-19 – 2019-01-20 (×2): via INTRAVENOUS

## 2019-01-19 MED ORDER — DOCUSATE SODIUM 100 MG PO CAPS: 100.0000 mg | ORAL_CAPSULE | Freq: Two times a day (BID) | ORAL | Status: DC | PRN

## 2019-01-19 MED ORDER — LABETALOL HCL 5 MG/ML IV SOLN: 20.0000 mg | INTRAVENOUS | Status: DC | PRN

## 2019-01-19 MED ORDER — MAGNESIUM SULFATE 2 GM/50ML IV SOLN
2.0000 g | INTRAVENOUS | Status: AC
  Administered 2019-01-19: 2 g via INTRAVENOUS
  Filled 2019-01-19: qty 50

## 2019-01-19 MED ORDER — LABETALOL HCL 5 MG/ML IV SOLN: 80.0000 mg | INTRAVENOUS | Status: DC | PRN

## 2019-01-19 MED ORDER — HYDRALAZINE HCL 20 MG/ML IJ SOLN: 10.0000 mg | INTRAMUSCULAR | Status: DC | PRN

## 2019-01-19 MED ORDER — IOHEXOL 350 MG/ML SOLN
100.0000 mL | Freq: Once | INTRAVENOUS | Status: AC | PRN
  Administered 2019-01-19: 100 mL via INTRAVENOUS

## 2019-01-19 MED ORDER — MAGNESIUM SULFATE BOLUS VIA INFUSION
4.0000 g | Freq: Once | INTRAVENOUS | Status: DC
  Filled 2019-01-19: qty 500

## 2019-01-19 MED ORDER — LACTATED RINGERS IV SOLN
INTRAVENOUS | Status: DC
  Administered 2019-01-19: 19:00:00 via INTRAVENOUS

## 2019-01-19 MED ORDER — MAGNESIUM HYDROXIDE 400 MG/5ML PO SUSP: 30.0000 mL | Freq: Every day | ORAL | Status: DC | PRN

## 2019-01-19 MED ORDER — MAGNESIUM SULFATE 40 G IN LACTATED RINGERS - SIMPLE
1.0000 g/h | INTRAVENOUS | Status: DC
  Filled 2019-01-19: qty 500

## 2019-01-19 NOTE — ED Notes (Signed)
C/o increased bp up to 190/105,  Also bilateral feet and legs swelling  Rt worse than left,  Mid back pain

## 2019-01-19 NOTE — ED Triage Notes (Signed)
Pt c/o HA, fatigue, SOB, back pain-states BP was elevated today-pt postpartum-vaginal deliver 5/31-NAD-to triage in w/c

## 2019-01-19 NOTE — ED Notes (Signed)
[facility] arrived to transport pt to [facility]

## 2019-01-19 NOTE — H&P (Signed)
Obstetric History and Physical  Annalese Knapper is a 43 y.o. [id number] who is s/p SVD on 01/11/19, discharged on 01/13/19 who went to [facility] with severe headache and severely elevated BP at home. (Per patient, was 170/105, 165/105, 193/119, 194/114). Reports she has had headache off and on for three days but was different today, has had blurred vision as well as severe, new onset photophobia. Reports she is still having minimal bleeding, no pain. No issues with voiding, stooling. Denies nausea/vomiting. Pregnancy c/b gDM for which she was induced. No h/o HTN or preeclampsia during pregnancy.   Transferred from [facility] for admission, she was started on MgSO4 at [facility]. She also underwent eval for lower extremity DVT, PE via CT chest as well as a head CT for her headache, all of which were negative.   Prenatal Course Pregnancy complications or risks: Patient Active Problem List   Diagnosis Date Noted  . Pre-eclampsia 01/19/2019  . Preeclampsia in postpartum period 01/19/2019  . Encounter for induction of labor 01/11/2019  . SVD (spontaneous vaginal delivery) 01/11/2019  . Gestational diabetes mellitus (GDM) 09/29/2018  . Encounter for supervision of high risk multigravida of advanced maternal age, antepartum 09/08/2018  . H/O gestational diabetes in prior pregnancy, currently pregnant 09/08/2018  . Obesity affecting pregnancy 09/08/2018  . Sickle cell trait ([place]) 09/08/2018  . Asthma 09/08/2018  . Breast mass 09/08/2018  . AMA (advanced maternal age) multigravida 42+ 10/08/2016    Medical History:  Past Medical History:  Diagnosis Date  . Anemia    not on Iron  . Asthma    on Symbicort inhaler; uses prn; last used 04/28/17  . Diabetes mellitus without complication ([place])   . Environmental allergies   . GERD (gastroesophageal reflux disease)    during pregnancy only  . Gestational diabetes    diet-controlled    Past Surgical History:  Procedure Laterality Date  . BREAST  SURGERY  2003   BILATERAL LUMPECTOMY   . DILATION AND EVACUATION N/A 12/12/2015   Procedure: DILATATION AND EVACUATION;  Surgeon: Shelly Bombard, MD;  Location: [facility];  Service: Gynecology;  Laterality: N/A;  . THUMB FUSION      OB History  Gravida Para Term Preterm AB Living  _0 0 1 4  SAB TAB Ectopic Multiple Live Births  0 1 0 0 4    # Outcome Date GA Lbr Len/2nd Weight Sex Delivery Anes PTL Lv  5 Term 01/11/19 58w6d06:55 / 00:10 3201 g F Vag-Spont EPI  LIV  4 Term 04/30/17 343w1d1:55 / 00:07 3666 g M Vag-Spont EPI  LIV  3 TAB 12/12/15 1153w3d TAB  N FD     Birth Comments: "baby had died; MD did D&E"  2 Term 06/06-24-09w53w0d87 g F Vag-Spont None N LIV  1 Term 05/25/06 40w050w0d2 g F Vag-Spont EPI N LIV    Social History   Socioeconomic History  . Marital status: Married    Spouse name: Not on file  . Number of children: Not on file  . Years of education: Not on file  . Highest education level: Not on file  Occupational History  . Not on file  Social Needs  . Financial resource strain: Not hard at all  . Food insecurity:    Worry: Never true    Inability: Never true  . Transportation needs:    Medical: No    Non-medical: Not on file  Tobacco Use  . Smoking status: Never Smoker  . Smokeless tobacco: Never Used  Substance and Sexual Activity  . Alcohol use: No  . Drug use: No  . Sexual activity: Not on file  Lifestyle  . Physical activity:    Days per week: Not on file    Minutes per session: Not on file  . Stress: Only a little  Relationships  . Social connections:    Talks on phone: Not on file    Gets together: Not on file    Attends religious service: Not on file    Active member of club or organization: Not on file    Attends meetings of clubs or organizations: Not on file    Relationship status: Not on file  Other Topics Concern  . Not on file  Social History Narrative  . Not on file    Family History  Problem Relation Age of Onset   . Diabetes Mother     Medications Prior to Admission  Medication Sig Dispense Refill Last Dose  . ACCU-CHEK FASTCLIX LANCETS MISC 1 Stick by Percutaneous route 4 (four) times daily. 100 each 12 Taking  . acetaminophen (TYLENOL) 325 MG tablet Take 2 tablets (650 mg total) by mouth every 4 (four) hours as needed (for pain scale < 4).     . albuterol (PROVENTIL HFA;VENTOLIN HFA) 108 (90 Base) MCG/ACT inhaler Inhale 2 puffs into the lungs every 6 (six) hours as needed for wheezing or shortness of breath. 1 Inhaler 2 Past Week at Unknown time  . aspirin EC 81 MG tablet Take 1 tablet (81 mg total) by mouth daily. Take after 12 weeks for prevention of preeclampsia later in pregnancy 300 tablet 2 01/11/2019 at Unknown time  . blood glucose meter kit and supplies Dispense based on patient and insurance preference. Use up to four times daily as directed. (FOR ICD-10 E10.9, E11.9). 1 each 0 Taking  . cetirizine (ZYRTEC) 10 MG tablet Take 10 mg by mouth daily as needed for allergies.    01/11/2019 at Unknown time  . coconut oil OIL Apply 1 application topically as needed.  0   . docusate sodium (COLACE) 100 MG capsule Take 1 capsule (100 mg total) by mouth 2 (two) times daily as needed. (Patient taking differently: Take 100 mg by mouth 2 (two) times daily as needed for mild constipation. ) 30 capsule 2 01/11/2019 at Unknown time  . fluticasone (FLOVENT HFA) 110 MCG/ACT inhaler Inhale 2 puffs into the lungs 2 (two) times daily. 1 Inhaler 12 01/11/2019 at Unknown time  . glucose blood test strip Check by percutaneous route 4 times daily 100 each 12 Taking  . ibuprofen (ADVIL) 600 MG tablet Take 1 tablet (600 mg total) by mouth every 6 (six) hours. 30 tablet 0   . montelukast (SINGULAIR) 10 MG tablet Take 10 mg by mouth at bedtime.   01/11/2019 at Unknown time  . omeprazole (PRILOSEC) 20 MG capsule Take 1 capsule (20 mg total) by mouth 2 (two) times daily before a meal. 60 capsule 5 01/11/2019 at Unknown time  .  Prenat-Fe Poly-Methfol-FA-DHA (VITAFOL ULTRA) 29-0.6-0.4-200 MG CAPS Take 1 tablet by mouth daily before breakfast. 30 capsule 11 01/11/2019 at Unknown time  . SYMBICORT 160-4.5 MCG/ACT inhaler Inhale 2 puffs into the lungs 2 (two) times daily.   01/11/2019 at Unknown time    No Known Allergies  Review of Systems: Negative except for what is mentioned in HPI.  Physical Exam: BP (!) 145/67 (BP  Location: Right Arm)   Pulse 65   Temp 98.1 F (36.7 C)   Resp 20   Ht _0  (1.702 m)   Wt (!) 143.3 kg   LMP 05/02/2018   SpO2 100%   BMI 49.49 kg/m  CONSTITUTIONAL: Well-developed, well-nourished female in no acute distress. Patient appears fatigued but is breast pumping on arrival. HENT:  Normocephalic, atraumatic, External right and left ear normal. Oropharynx is clear and moist EYES: Conjunctivae and EOM are normal. Pupils are equal, round, and reactive to light. No scleral icterus.  NECK: Normal range of motion, supple, no masses SKIN: Skin is warm and dry. No rash noted. Not diaphoretic. No erythema. No pallor. NEUROLOGIC: Alert and oriented to person, place, and time. Normal reflexes, muscle tone coordination. No cranial nerve deficit noted. PSYCHIATRIC: Normal mood and affect. Normal behavior. Normal judgment and thought content. CARDIOVASCULAR: Normal heart rate noted, regular rhythm RESPIRATORY: Effort and breath sounds normal, no problems with respiration noted ABDOMEN: Soft, nontender, nondistended, gravid. MUSCULOSKELETAL: Normal range of motion. No edema and no tenderness. 2+ distal pulses.   Pertinent Labs/Studies:   Results for orders placed or performed during the hospital encounter of 01/19/19 (from the past 24 hour(s))  Urinalysis, Routine w reflex microscopic     Status: Abnormal   Collection Time: 01/19/19  2:22 PM  Result Value Ref Range   Color, Urine YELLOW YELLOW   APPearance CLEAR CLEAR   Specific Gravity, Urine 1.015 1.005 - 1.030   pH 7.5 5.0 - 8.0   Glucose,  UA NEGATIVE NEGATIVE mg/dL   Hgb urine dipstick LARGE (A) NEGATIVE   Bilirubin Urine NEGATIVE NEGATIVE   Ketones, ur NEGATIVE NEGATIVE mg/dL   Protein, ur NEGATIVE NEGATIVE mg/dL   Nitrite NEGATIVE NEGATIVE   Leukocytes,Ua SMALL (A) NEGATIVE  Urinalysis, Microscopic (reflex)     Status: Abnormal   Collection Time: 01/19/19  2:22 PM  Result Value Ref Range   RBC / HPF 6-10 0 - 5 RBC/hpf   WBC, UA 6-10 0 - 5 WBC/hpf   Bacteria, UA FEW (A) NONE SEEN   Squamous Epithelial / LPF 0-5 0 - 5   WBC Clumps PRESENT   CBC with Differential     Status: Abnormal   Collection Time: 01/19/19  2:40 PM  Result Value Ref Range   WBC 6.3 4.0 - 10.5 K/uL   RBC 5.61 (H) 3.87 - 5.11 MIL/uL   Hemoglobin 12.4 12.0 - 15.0 g/dL   HCT 39.3 36.0 - 46.0 %   MCV 70.1 (L) 80.0 - 100.0 fL   MCH 22.1 (L) 26.0 - 34.0 pg   MCHC 31.6 30.0 - 36.0 g/dL   RDW 14.6 11.5 - 15.5 %   Platelets 258 150 - 400 K/uL   nRBC 0.0 0.0 - 0.2 %   Neutrophils Relative % 63 %   Neutro Abs 4.0 1.7 - 7.7 K/uL   Lymphocytes Relative 25 %   Lymphs Abs 1.5 0.7 - 4.0 K/uL   Monocytes Relative 7 %   Monocytes Absolute 0.5 0.1 - 1.0 K/uL   Eosinophils Relative 4 %   Eosinophils Absolute 0.2 0.0 - 0.5 K/uL   Basophils Relative 1 %   Basophils Absolute 0.0 0.0 - 0.1 K/uL   WBC Morphology MORPHOLOGY UNREMARKABLE    RBC Morphology MORPHOLOGY UNREMARKABLE    Smear Review Normal platelet morphology    Immature Granulocytes 0 %   Abs Immature Granulocytes 0.02 0.00 - 0.07 K/uL  Comprehensive metabolic panel  Status: Abnormal   Collection Time: 01/19/19  2:40 PM  Result Value Ref Range   Sodium 137 135 - 145 mmol/L   Potassium 3.8 3.5 - 5.1 mmol/L   Chloride 107 98 - 111 mmol/L   CO2 23 22 - 32 mmol/L   Glucose, Bld 97 70 - 99 mg/dL   BUN 10 6 - 20 mg/dL   Creatinine, Ser 0.72 0.44 - 1.00 mg/dL   Calcium 8.7 (L) 8.9 - 10.3 mg/dL   Total Protein 6.5 6.5 - 8.1 g/dL   Albumin 3.4 (L) 3.5 - 5.0 g/dL   AST 25 15 - 41 U/L   ALT 37 0  - 44 U/L   Alkaline Phosphatase 67 38 - 126 U/L   Total Bilirubin 0.4 0.3 - 1.2 mg/dL   GFR calc non Af Amer >60 >60 mL/min   GFR calc Af Amer >60 >60 mL/min   Anion gap 7 5 - 15  SARS Coronavirus 2 (Hosp order,Performed in [facility] lab via [employer] [facility])     Status: None   Collection Time: 01/19/19  4:27 PM  Result Value Ref Range   SARS Coronavirus 2 ([employer] [facility] Now) NEGATIVE NEGATIVE    Assessment : Donasia Wimes is a 43 y.o. [id number] PPD#8 s/p SVD after IOL for gDM who is admitted for post partum preeclampsia and magnesium therapy based on blood pressure and headache. Headache slightly improved now. Reviewed plan for 24 hrs magnesium therapy, +/- anti-hypertensives as needed. She is agreeable to plan. Patient is breastfeeding.  Observation on OBSC MgSO4 therapy LR @ 100 mL/hr IOs Regular diet Repeat CBC/CMP in am IV labetalol per protocol for BP management    Feliz Beam, M.D. Attending [facility] [employer] (Faculty Practice)  01/19/2019, 10:51 PM

## 2019-01-19 NOTE — ED Notes (Signed)
Ultrasound at bedside

## 2019-01-19 NOTE — ED Provider Notes (Signed)
[facility] Provider Note   CSN: [id number] Arrival date & time: 01/19/19  1332    History   Chief Complaint Chief Complaint  Patient presents with   Hypertension    postpartum    HPI Cheryl Acosta is a 43 y.o. female.     HPI   2-3 days headaches, restless, sensitive to light Headaches pressure like 7/10, takes a few tylenol, helps some When headache is more severe will have some blurred vision, but none at this time Shortness of breath started after coming back from hospital, thought it would get better.   Out of breath quickly  No n/v/CP, occ cramping with pumping no other abdominal pain Both legs swollen, right worse and painful. For 6 days since returning from hospital.  No leg swelling with pregnancy.   Induced, normal vaginal delivery  No hx of DVT/PE, no hx of htn, no hx of preeclampsia.   Took blood pressure at home and was 170/105, 165/105, 193/119, 194/114  Past Medical History:  Diagnosis Date   Anemia    not on Iron   Asthma    on Symbicort inhaler; uses prn; last used 04/28/17   Diabetes mellitus without complication ([place])    Environmental allergies    GERD (gastroesophageal reflux disease)    during pregnancy only   Gestational diabetes    diet-controlled    Patient Active Problem List   Diagnosis Date Noted   Pre-eclampsia 01/19/2019   Encounter for induction of labor 01/11/2019   SVD (spontaneous vaginal delivery) 01/11/2019   Gestational diabetes mellitus (GDM) 09/29/2018   Encounter for supervision of high risk multigravida of advanced maternal age, antepartum 09/08/2018   H/O gestational diabetes in prior pregnancy, currently pregnant 09/08/2018   Obesity affecting pregnancy 09/08/2018   Sickle cell trait ([place]) 09/08/2018   Asthma 09/08/2018   Breast mass 09/08/2018   AMA (advanced maternal age) multigravida 40+ 10/08/2016    Past Surgical History:  Procedure Laterality Date    BREAST SURGERY  2003   BILATERAL LUMPECTOMY    DILATION AND EVACUATION N/A 12/12/2015   Procedure: DILATATION AND EVACUATION;  Surgeon: Shelly Bombard, MD;  Location: [facility];  Service: Gynecology;  Laterality: N/A;   THUMB FUSION       OB History    Gravida  5   Para  4   Term  4   Preterm  0   AB  1   Living  4     SAB  0   TAB  1   Ectopic  0   Multiple  0   Live Births  4            Home Medications    Prior to Admission medications   Medication Sig Start Date End Date Taking? Authorizing Provider  ACCU-CHEK FASTCLIX LANCETS MISC 1 Stick by Percutaneous route 4 (four) times daily. 10/01/18   Sloan Leiter, MD  acetaminophen (TYLENOL) 325 MG tablet Take 2 tablets (650 mg total) by mouth every 4 (four) hours as needed (for pain scale < 4). 01/13/19   Wilber Oliphant, MD  albuterol (PROVENTIL HFA;VENTOLIN HFA) 108 (90 Base) MCG/ACT inhaler Inhale 2 puffs into the lungs every 6 (six) hours as needed for wheezing or shortness of breath. 11/06/18   Emily Filbert, MD  aspirin EC 81 MG tablet Take 1 tablet (81 mg total) by mouth daily. Take after 12 weeks for prevention of preeclampsia later in pregnancy 09/08/18  Sloan Leiter, MD  blood glucose meter kit and supplies Dispense based on patient and insurance preference. Use up to four times daily as directed. (FOR ICD-10 E10.9, E11.9). 10/01/18   Sloan Leiter, MD  cetirizine (ZYRTEC) 10 MG tablet Take 10 mg by mouth daily as needed for allergies.     [provider]  coconut oil OIL Apply 1 application topically as needed. 01/13/19   Wilber Oliphant, MD  docusate sodium (COLACE) 100 MG capsule Take 1 capsule (100 mg total) by mouth 2 (two) times daily as needed. Patient taking differently: Take 100 mg by mouth 2 (two) times daily as needed for mild constipation.  01/06/19   Constant, Peggy, MD  fluticasone (FLOVENT HFA) 110 MCG/ACT inhaler Inhale 2 puffs into the lungs 2 (two) times daily. 11/06/18   Clovia Cuff C, MD    glucose blood test strip Check by percutaneous route 4 times daily 10/01/18   Sloan Leiter, MD  ibuprofen (ADVIL) 600 MG tablet Take 1 tablet (600 mg total) by mouth every 6 (six) hours. 01/13/19   Wilber Oliphant, MD  montelukast (SINGULAIR) 10 MG tablet Take 10 mg by mouth at bedtime.    [provider]  omeprazole (PRILOSEC) 20 MG capsule Take 1 capsule (20 mg total) by mouth 2 (two) times daily before a meal. 12/23/18   Shelly Bombard, MD  Prenat-Fe Poly-Methfol-FA-DHA (VITAFOL ULTRA) 29-0.6-0.4-200 MG CAPS Take 1 tablet by mouth daily before breakfast. 02/18/18   Shelly Bombard, MD  SYMBICORT 160-4.5 MCG/ACT inhaler Inhale 2 puffs into the lungs 2 (two) times daily. 04/23/17   [provider]    Family History Family History  Problem Relation Age of Onset   Diabetes Mother     Social History Social History   Tobacco Use   Smoking status: Never Smoker   Smokeless tobacco: Never Used  Substance Use Topics   Alcohol use: No   Drug use: No     Allergies   Patient has no known allergies.   Review of Systems Review of Systems  Constitutional: Negative for fever.  HENT: Negative for sore throat.   Eyes: Negative for visual disturbance (blurry vision).  Respiratory: Negative for cough and shortness of breath.   Cardiovascular: Negative for chest pain.  Gastrointestinal: Negative for abdominal pain, diarrhea, nausea and vomiting.  Genitourinary: Positive for frequency. Negative for difficulty urinating and dysuria.  Musculoskeletal: Negative for back pain and neck pain.  Skin: Negative for rash.  Neurological: Positive for headaches. Negative for syncope, facial asymmetry, weakness and numbness.     Physical Exam Updated Vital Signs BP 126/81    Pulse 60    Temp 98.4 F (36.9 C) (Oral)    Resp 19    Ht '5\' 7"'$  (1.702 m)    Wt (!) 143.3 kg    LMP 05/02/2018    SpO2 98%    BMI 49.49 kg/m   Physical Exam Vitals signs and nursing note reviewed.   Constitutional:      General: She is not in acute distress.    Appearance: She is well-developed. She is not diaphoretic.  HENT:     Head: Normocephalic and atraumatic.  Eyes:     Conjunctiva/sclera: Conjunctivae normal.  Neck:     Musculoskeletal: Normal range of motion.  Cardiovascular:     Rate and Rhythm: Normal rate and regular rhythm.     Heart sounds: Normal heart sounds. No murmur. No friction rub. No gallop.  Pulmonary:     Effort: Pulmonary effort is normal. No respiratory distress.     Breath sounds: Normal breath sounds. No wheezing or rales.  Abdominal:     General: There is no distension.     Palpations: Abdomen is soft.     Tenderness: There is no abdominal tenderness. There is no guarding.  Musculoskeletal:        General: Tenderness present.     Right lower leg: Edema (right slightly greater than left) present.     Left lower leg: Edema (mild) present.  Skin:    General: Skin is warm and dry.     Findings: No erythema or rash.  Neurological:     Mental Status: She is alert and oriented to person, place, and time.     GCS: GCS eye subscore is 4. GCS verbal subscore is 5. GCS motor subscore is 6.     Cranial Nerves: Cranial nerves are intact.     Sensory: Sensation is intact.     Motor: Motor function is intact.     Coordination: Coordination normal.      ED Treatments / Results  Labs (all labs ordered are listed, but only abnormal results are displayed) Labs Reviewed  CBC WITH DIFFERENTIAL/PLATELET - Abnormal; Notable for the following components:      Result Value   RBC 5.61 (*)    MCV 70.1 (*)    MCH 22.1 (*)    All other components within normal limits  COMPREHENSIVE METABOLIC PANEL - Abnormal; Notable for the following components:   Calcium 8.7 (*)    Albumin 3.4 (*)    All other components within normal limits  URINALYSIS, ROUTINE W REFLEX MICROSCOPIC - Abnormal; Notable for the following components:   Hgb urine dipstick LARGE (*)     Leukocytes,Ua SMALL (*)    All other components within normal limits  URINALYSIS, MICROSCOPIC (REFLEX) - Abnormal; Notable for the following components:   Bacteria, UA FEW (*)    All other components within normal limits  SARS CORONAVIRUS 2 (HOSP ORDER, PERFORMED IN [facility] VIA ABBOTT ID)    EKG None  Radiology Ct Head Wo Contrast  Result Date: 01/19/2019 CLINICAL DATA:  Fatigue, headache and shortness of breath. Elevated blood pressure today. Patient postpartum 01/11/2019. EXAM: CT HEAD WITHOUT CONTRAST TECHNIQUE: Contiguous axial images were obtained from the base of the skull through the vertex without intravenous contrast. COMPARISON:  None. FINDINGS: Brain: No evidence of acute infarction, hemorrhage, hydrocephalus, extra-axial collection or mass lesion/mass effect. Vascular: No hyperdense vessel or unexpected calcification. Skull: Intact.  No focal lesion. Sinuses/Orbits: Negative. Other: None. IMPRESSION: Negative head CT. Electronically Signed   By: Inge Rise M.D.   On: 01/19/2019 16:06   Ct Angio Chest Pe W And/or Wo Contrast  Result Date: 01/19/2019 CLINICAL DATA:  Postpartum female patient status post vaginal delivery 01/11/2019. Bilateral lower extremity swelling. Dyspnea. Fatigue. Back pain. EXAM: CT ANGIOGRAPHY CHEST WITH CONTRAST TECHNIQUE: Multidetector CT imaging of the chest was performed using the standard protocol during bolus administration of intravenous contrast. Multiplanar CT image reconstructions and MIPs were obtained to evaluate the vascular anatomy. CONTRAST:  141m OMNIPAQUE IOHEXOL 350 MG/ML SOLN COMPARISON:  02/08/2017 chest radiograph. FINDINGS: Cardiovascular: The study is high quality for the evaluation of pulmonary embolism. There are no filling defects in the central, lobar, segmental or subsegmental pulmonary artery branches to suggest acute pulmonary embolism. Great vessels are normal in course and caliber. Top-normal heart size. No significant  pericardial fluid/thickening. Mediastinum/Nodes: No discrete thyroid nodules. Unremarkable esophagus. Mild bilateral axillary lymphadenopathy measuring up to the 1.6 cm on the right (series 7/image 74) and 1.5 cm on the left (series 7/image 123). No pathologically enlarged mediastinal or hilar lymph nodes. Lungs/Pleura: No pneumothorax. No pleural effusion. No acute consolidative airspace disease, lung masses or significant pulmonary nodules. Upper abdomen: Left adrenal 1.6 cm nodule with density -80 HU, compatible with adenoma. Musculoskeletal:  No aggressive appearing focal osseous lesions. Review of the MIP images confirms the above findings. IMPRESSION: 1. No pulmonary embolism.  No active pulmonary disease. 2. Nonspecific mild bilateral axillary lymphadenopathy. Correlate clinically for any potential explanation (collagen vascular disease, chronic infection, etc). Consider outpatient referral for diagnostic mammographic evaluation. 3. Left adrenal adenoma. Electronically Signed   By: Ilona Sorrel M.D.   On: 01/19/2019 16:24    Procedures .Critical Care Performed by: Gareth Morgan, MD Authorized by: Gareth Morgan, MD   Critical care provider statement:    Critical care time (minutes):  30   Critical care was time spent personally by me on the following activities:  Discussions with consultants, evaluation of patient's response to treatment, examination of patient, ordering and performing treatments and interventions, ordering and review of laboratory studies, ordering and review of radiographic studies, pulse oximetry, re-evaluation of patient's condition, obtaining history from patient or surrogate and review of old charts   (including critical care time)  Medications Ordered in ED Medications  magnesium sulfate IVPB 2 g 50 mL (has no administration in time range)  lactated ringers infusion (has no administration in time range)  iohexol (OMNIPAQUE) 350 MG/ML injection 100 mL (100 mLs  Intravenous Contrast Given 01/19/19 1552)     Initial Impression / Assessment and Plan / ED Course  I have reviewed the triage vital signs and the nursing notes.  Pertinent labs & imaging results that were available during my care of the patient were reviewed by me and considered in my medical decision making (see chart for details).        43 year old female 9 days postpartum with history of diabetes presents with concern for headache, elevated blood pressure, and shortness of breath.  Reports that she checked her blood pressures at home and they had been elevated 756E to 332R systolic.   Ordered CT angio to evaluate for possible PE given patient recently postpartum, describing shortness of breath, and right greater than left leg swelling. CT PE and DVT study pending. EKG with sinus rhythm, no acute abnormalities.   CT head ordered and negative. In the setting of elevated BP and other symptoms, do not feel emergent MRI indicated for dural venous thrombosis at this time.  Labs without significant abnormalities.  Discussed with OBGYN, Dr. Rosana Hoes.  Her symptoms of severe headache, blurred vision, elevated blood pressures at home and mild elevation on arrival to ED concerning for preeclampsia.  Ordered 4g Mg bolus and infusion and plan to admit to Postpartum if no other significant findings on CT/DVT study.   Final Clinical Impressions(s) / ED Diagnoses   Final diagnoses:  Acute nonintractable headache, unspecified headache type  Shortness of breath  Pre-eclampsia, antepartum    ED Discharge Orders    None       Gareth Morgan, MD 01/19/19 [phone]

## 2019-01-20 LAB — CBC
HCT: 41.9 % (ref 36.0–46.0)
Hemoglobin: 13.4 g/dL (ref 12.0–15.0)
MCH: 22 pg — ABNORMAL LOW (ref 26.0–34.0)
MCHC: 32 g/dL (ref 30.0–36.0)
MCV: 68.9 fL — ABNORMAL LOW (ref 80.0–100.0)
Platelets: 267 10*3/uL (ref 150–400)
RBC: 6.08 MIL/uL — ABNORMAL HIGH (ref 3.87–5.11)
RDW: 14.6 % (ref 11.5–15.5)
WBC: 5.1 10*3/uL (ref 4.0–10.5)
nRBC: 0 % (ref 0.0–0.2)

## 2019-01-20 LAB — COMPREHENSIVE METABOLIC PANEL
ALT: 36 U/L (ref 0–44)
AST: 24 U/L (ref 15–41)
Albumin: 3.1 g/dL — ABNORMAL LOW (ref 3.5–5.0)
Alkaline Phosphatase: 64 U/L (ref 38–126)
Anion gap: 11 (ref 5–15)
BUN: 7 mg/dL (ref 6–20)
CO2: 23 mmol/L (ref 22–32)
Calcium: 8.4 mg/dL — ABNORMAL LOW (ref 8.9–10.3)
Chloride: 103 mmol/L (ref 98–111)
Creatinine, Ser: 0.88 mg/dL (ref 0.44–1.00)
GFR calc Af Amer: >60 mL/min (ref >60)
GFR calc non Af Amer: >60 mL/min (ref >60)
Glucose, Bld: 96 mg/dL (ref 70–99)
Potassium: 3.7 mmol/L (ref 3.5–5.1)
Sodium: 137 mmol/L (ref 135–145)
Total Bilirubin: 0.4 mg/dL (ref 0.3–1.2)
Total Protein: 7 g/dL (ref 6.5–8.1)

## 2019-01-20 MED ORDER — AMLODIPINE BESYLATE 10 MG PO TABS
10.0000 mg | ORAL_TABLET | Freq: Every day | ORAL | Status: DC
  Administered 2019-01-20: 10 mg via ORAL
  Filled 2019-01-20: qty 1

## 2019-01-20 MED ORDER — AMLODIPINE BESYLATE 10 MG PO TABS: 10.0000 mg | ORAL_TABLET | Freq: Every day | ORAL | 3 refills | Status: DC

## 2019-01-20 MED ORDER — BUTALBITAL-APAP-CAFFEINE 50-325-40 MG PO CAPS: 1.0000 | ORAL_CAPSULE | Freq: Four times a day (QID) | ORAL | 0 refills | Status: DC | PRN

## 2019-01-20 MED ORDER — ACETAMINOPHEN 500 MG PO TABS
1000.0000 mg | ORAL_TABLET | Freq: Four times a day (QID) | ORAL | Status: DC | PRN
  Administered 2019-01-20 (×2): 1000 mg via ORAL
  Filled 2019-01-20 (×2): qty 2

## 2019-01-20 NOTE — Progress Notes (Signed)
[facility] Attending Note  Subjective:  Patient with headache this morning, awaiting medication. No visual symptoms, no RUQ/epigastric pain.   Admitted on 01/19/2019 for Preeclampsia in postpartum period.    Objective:  Blood pressure (!) 148/93, pulse 65, temperature (!) 97.5 F (36.4 C), temperature source Oral, resp. rate 18, height 5\' 7"  (1.702 m), weight (!) 143.3 kg, last menstrual period 05/02/2018, SpO2 98 %, unknown if currently breastfeeding.  Patient Vitals for the past 24 hrs:  BP Temp Temp src Pulse Resp SpO2 Height Weight  01/20/19 0826 (!) 148/93 (!) 97.5 F (36.4 C) Oral 65 18 98 % - -  01/20/19 0710 - - - - - 93 % - -  01/20/19 0600 - - - - - 97 % - -  01/20/19 0500 - - - - 18 100 % - -  01/20/19 0349 134/75 98 F (36.7 C) Oral (!) 58 - 99 % - -  01/20/19 0100 - - - - 16 - - -  01/20/19 0035 (!) 142/49 - - 62 18 99 % - -  01/19/19 2341 (!) 150/74 97.7 F (36.5 C) Oral 61 20 - - -  01/19/19 2340 - - - - - 100 % - -  01/19/19 2233 (!) 161/74 - - 61 20 - - -  01/19/19 2230 - - - - - 100 % - -  01/19/19 2140 (!) 145/67 98.1 F (36.7 C) - 65 20 100 % - -  01/19/19 2032 (!) 159/87 - - 68 20 99 % - -  01/19/19 1838 (!) 145/98 - - 63 (!) 25 100 % - -  01/19/19 1717 (!) 139/99 - - 69 (!) 21 99 % - -  01/19/19 1608 126/81 - - 60 19 98 % - -  01/19/19 1446 117/70 - - (!) 56 - 99 % - -  01/19/19 1345 140/76 98.4 F (36.9 C) Oral 72 16 98 % - -  01/19/19 1344 - - - - - - 5\' 7"  (1.702 m) (!) 143.3 kg   Gen: NAD HENT: Normocephalic, atraumatic Lungs: Normal respiratory effort Heart: Regular rate noted Abdomen: NT, soft Cervix: Deferred Ext: 2+ DTRs, no edema, no cyanosis, negative Homan's sign  Assessment & Plan:  43 y.o. [id number] PPD#9 admitted with postpartum preeclampsia - Started on Norvasc 10 mg daily - Continue Magnesium sulfate until 1645 today.  If BP stable after 4 hours, can consider discharge to home. Orders placed (can be rescinded if  needed). - Continue close observation.  Verita Schneiders, MD, [place] [facility] [employer], [place]

## 2019-01-20 NOTE — Discharge Instructions (Signed)
Postpartum Hypertension  Postpartum hypertension is high blood pressure that remains higher than normal after childbirth. You may not realize that you have postpartum hypertension if your blood pressure is not being checked regularly. In most cases, postpartum hypertension will go away on its own, usually within a week of delivery. However, for some women, medical treatment is required to prevent serious complications, such as seizures or stroke.  What are the causes?  This condition may be caused by one or more of the following:   Hypertension that existed before pregnancy (chronic hypertension).   Hypertension that comes on as a result of pregnancy (gestational hypertension).   Hypertensive disorders during pregnancy (preeclampsia) or seizures in women who have high blood pressure during pregnancy (eclampsia).   A condition in which the liver, platelets, and red blood cells are damaged during pregnancy (HELLP syndrome).   A condition in which the thyroid produces too much hormones (hyperthyroidism).   Other rare problems of the nerves (neurological disorders) or blood disorders.  In some cases, the cause may not be known.  What increases the risk?  The following factors may make you more likely to develop this condition:   Chronic hypertension. In some cases, this may not have been diagnosed before pregnancy.   Obesity.   Type 2 diabetes.   Kidney disease.   History of preeclampsia or eclampsia.   Other medical conditions that change the level of hormones in the body (hormonal imbalance).  What are the signs or symptoms?  As with all types of hypertension, postpartum hypertension may not have any symptoms. Depending on how high your blood pressure is, you may experience:   Headaches. These may be mild, moderate, or severe. They may also be steady, constant, or sudden in onset (thunderclap headache).   Changes in your ability to see (visual changes).   Dizziness.   Shortness of breath.   Swelling  of your hands, feet, lower legs, or face. In some cases, you may have swelling in more than one of these locations.   Heart palpitations or a racing heartbeat.   Difficulty breathing while lying down.   Decrease in the amount of urine that you pass.  Other rare signs and symptoms may include:   Sweating more than usual. This lasts longer than a few days after delivery.   Chest pain.   Sudden dizziness when you get up from sitting or lying down.   Seizures.   Nausea or vomiting.   Abdominal pain.  How is this diagnosed?  This condition may be diagnosed based on the results of a physical exam, blood pressure measurements, and blood and urine tests.  You may also have other tests, such as a CT scan or an MRI, to check for other problems of postpartum hypertension.  How is this treated?  If blood pressure is high enough to require treatment, your options may include:   Medicines to reduce blood pressure (antihypertensives). Tell your health care provider if you are breastfeeding or if you plan to breastfeed. There are many antihypertensive medicines that are safe to take while breastfeeding.   Stopping medicines that may be causing hypertension.   Treating medical conditions that are causing hypertension.   Treating the complications of hypertension, such as seizures, stroke, or kidney problems.  Your health care provider will also continue to monitor your blood pressure closely until it is within a safe range for you.  Follow these instructions at home:   Take over-the-counter and prescription medicines only as   told by your health care provider.   Return to your normal activities as told by your health care provider. Ask your health care provider what activities are safe for you.   Do not use any products that contain nicotine or tobacco, such as cigarettes and e-cigarettes. If you need help quitting, ask your health care provider.   Keep all follow-up visits as told by your health care provider. This  is important.  Contact a health care provider if:   Your symptoms get worse.   You have new symptoms, such as:  ? A headache that does not get better.  ? Dizziness.  ? Visual changes.  Get help right away if:   You suddenly develop swelling in your hands, ankles, or face.   You have sudden, rapid weight gain.   You develop difficulty breathing, chest pain, racing heartbeat, or heart palpitations.   You develop severe pain in your abdomen.   You have any symptoms of a stroke. "BE FAST" is an easy way to remember the main warning signs of a stroke:  ? B - Balance. Signs are dizziness, sudden trouble walking, or loss of balance.  ? E - Eyes. Signs are trouble seeing or a sudden change in vision.  ? F - Face. Signs are sudden weakness or numbness of the face, or the face or eyelid drooping on one side.  ? A - Arms. Signs are weakness or numbness in an arm. This happens suddenly and usually on one side of the body.  ? S - Speech. Signs are sudden trouble speaking, slurred speech, or trouble understanding what people say.  ? T - Time. Time to call emergency services. Write down what time symptoms started.   You have other signs of a stroke, such as:  ? A sudden, severe headache with no known cause.  ? Nausea or vomiting.  ? Seizure.  These symptoms may represent a serious problem that is an emergency. Do not wait to see if the symptoms will go away. Get medical help right away. Call your local emergency services (911 in the [place] Do not drive yourself to the hospital.  Summary   Postpartum hypertension is high blood pressure that remains higher than normal after childbirth.   In most cases, postpartum hypertension will go away on its own, usually within a week of delivery.   For some women, medical treatment is required to prevent serious complications, such as seizures or stroke.  This information is not intended to replace advice given to you by your health care provider. Make sure you discuss any questions  you have with your health care provider.  Document Released: 04/02/2014 Document Revised: 05/20/2017 Document Reviewed: 05/20/2017  Elsevier Interactive Patient Education  2019 [employer]

## 2019-01-20 NOTE — Progress Notes (Signed)
Pt complaining of tightness in chest. Rating pain 2/10. States "its not really bothering me, I think its from my headache." Lung sounds clear. Tylenol given for headache. HOB elevated.

## 2019-01-20 NOTE — Discharge Summary (Signed)
Physician Discharge Summary  Patient ID: Cheryl Acosta MRN: [id number] DOB/AGE: 03/05/1976 43 y.o.  Admit date: 01/19/2019 Discharge date: 01/20/2019  Admission Diagnoses:  Discharge Diagnoses:  Principal Problem:   Preeclampsia in postpartum period  Discharged Condition: stable  Hospital Course: Patient was admitted for postpartum preeclampsia after having a vaginal delivery on 01/11/2019. Had severe range BP and headache, was admitted and started on magnesium sulfate and analgesia for her headache.  Her  Consults: None  Significant Diagnostic Studies: CBC Latest Ref Rng & Units 01/20/2019 01/19/2019 01/11/2019  WBC 4.0 - 10.5 K/uL 5.1 6.3 6.2  Hemoglobin 12.0 - 15.0 g/dL 13.4 12.4 13.0  Hematocrit 36.0 - 46.0 % 41.9 39.3 39.7  Platelets 150 - 400 K/uL 267 258 225   CMP Latest Ref Rng & Units 01/20/2019 01/19/2019 09/08/2018  Glucose 70 - 99 mg/dL 96 97 92  BUN 6 - 20 mg/dL _0 Creatinine 0.44 - 1.00 mg/dL 0.88 0.72 0.64  Sodium 135 - 145 mmol/L 137 137 136  Potassium 3.5 - 5.1 mmol/L 3.7 3.8 4.2  Chloride 98 - 111 mmol/L 103 107 102  CO2 22 - 32 mmol/L _1 Calcium 8.9 - 10.3 mg/dL 8.4(L) 8.7(L) 9.3  Total Protein 6.5 - 8.1 g/dL 7.0 6.5 6.4  Total Bilirubin 0.3 - 1.2 mg/dL 0.4 0.4 <0.2  Alkaline Phos 38 - 126 U/L 64 67 50  AST 15 - 41 U/L _2 ALT 0 - 44 U/L 36 37 6    Treatments: Magnesium sulfate and Norvasc  Discharge Exam: Blood pressure (!) 148/93, pulse 65, temperature (!) 97.5 F (36.4 C), temperature source Oral, resp. rate 18, height _3  (1.702 m), weight (!) 143.3 kg, last menstrual period 05/02/2018, SpO2 98 %, unknown if currently breastfeeding. General appearance: alert and no distress Resp: clear to auscultation bilaterally Cardio: regular rate and rhythm GI: soft, non-tender; bowel sounds normal; no masses,  no organomegaly Pelvic: deferred Extremities: extremities normal, atraumatic, no cyanosis or edema and Homans sign is negative, no sign of DVT,  2+ DTRs  Disposition: Discharge disposition: 01-Home or Self Care        Allergies as of 01/20/2019   No Known Allergies     Medication List    STOP taking these medications   Accu-Chek FastClix Lancets Misc   blood glucose meter kit and supplies   glucose blood test strip     TAKE these medications   acetaminophen 325 MG tablet Commonly known as:  Tylenol Take 2 tablets (650 mg total) by mouth every 4 (four) hours as needed (for pain scale < 4).   albuterol 108 (90 Base) MCG/ACT inhaler Commonly known as:  VENTOLIN HFA Inhale 2 puffs into the lungs every 6 (six) hours as needed for wheezing or shortness of breath.   amLODipine 10 MG tablet Commonly known as:  NORVASC Take 1 tablet (10 mg total) by mouth daily.   aspirin EC 81 MG tablet Take 1 tablet (81 mg total) by mouth daily. Take after 12 weeks for prevention of preeclampsia later in pregnancy   Butalbital-APAP-Caffeine 50-325-40 MG capsule Take 1-2 capsules by mouth every 6 (six) hours as needed for headache.   cetirizine 10 MG tablet Commonly known as:  ZYRTEC Take 10 mg by mouth daily as needed for allergies.   coconut oil Oil Apply 1 application topically as needed.   docusate sodium 100 MG capsule Commonly known as:  COLACE Take 1 capsule (100 mg total) by  mouth 2 (two) times daily as needed. What changed:  reasons to take this   fluticasone 110 MCG/ACT inhaler Commonly known as:  FLOVENT HFA Inhale 2 puffs into the lungs 2 (two) times daily.   ibuprofen 600 MG tablet Commonly known as:  ADVIL Take 1 tablet (600 mg total) by mouth every 6 (six) hours.   montelukast 10 MG tablet Commonly known as:  SINGULAIR Take 10 mg by mouth at bedtime.   omeprazole 20 MG capsule Commonly known as:  PriLOSEC Take 1 capsule (20 mg total) by mouth 2 (two) times daily before a meal.   Symbicort 160-4.5 MCG/ACT inhaler Generic drug:  budesonide-formoterol Inhale 2 puffs into the lungs 2 (two) times  daily.   Vitafol Ultra 29-0.6-0.4-200 MG Caps Take 1 tablet by mouth daily before breakfast.      Follow-up Information    [facility] Follow up in 1 week(s).   Specialty:  Obstetrics and Gynecology Contact information: [address] [place] [phone]         Future Appointments  Date Time Provider [place]  02/09/2019 10:00 AM Woodroe Mode, MD [facility] None  02/23/2019  9:15 AM [facility] None  03/24/2019  9:00 AM [facility] [place]OM[facility] 1 [facility]  03/24/2019  9:10 AM [facility] [place] 1 [facility]    Signed: Airlie Blumenberg 01/20/2019, 10:48 AM

## 2019-01-27 ENCOUNTER — Other Ambulatory Visit: Payer: Self-pay

## 2019-01-27 ENCOUNTER — Ambulatory Visit (INDEPENDENT_AMBULATORY_CARE_PROVIDER_SITE_OTHER): Payer: BC Managed Care – PPO

## 2019-01-27 VITALS — BP 122/82

## 2019-01-27 DIAGNOSIS — O1495 Unspecified pre-eclampsia, complicating the puerperium: Secondary | ICD-10-CM

## 2019-01-27 NOTE — Progress Notes (Signed)
Subjective:  Cheryl Acosta is a 43 y.o. female here for BP check. Pt taking Norvasc 10 mg. Last dose was yesterday morning.   Hypertension ROS: taking medications as instructed, no medication side effects noted, no TIA's, no chest pain on exertion, no dyspnea on exertion and no swelling of ankles.    Objective:  LMP 05/02/2018   Appearance alert, well appearing, and in no distress. General exam BP noted to be well controlled today in office.    Assessment:   Blood Pressure well controlled.   Plan:  Current treatment plan is effective, no change in therapy.  Pt to keep routine pp visit 02/09/2019.

## 2019-01-28 NOTE — Progress Notes (Signed)
Patient ID: Cheryl Acosta, female   DOB: 19-Mar-1976, 43 y.o.   MRN: [id number] I have reviewed the chart and agree with nursing staff's documentation of this patient's encounter.  Emeterio Reeve, MD 01/28/2019 9:18 AM

## 2019-02-09 ENCOUNTER — Ambulatory Visit (INDEPENDENT_AMBULATORY_CARE_PROVIDER_SITE_OTHER): Payer: BC Managed Care – PPO | Admitting: Obstetrics & Gynecology

## 2019-02-09 ENCOUNTER — Other Ambulatory Visit: Payer: Self-pay

## 2019-02-09 VITALS — BP 114/79 | HR 69 | Wt 299.0 lb

## 2019-02-09 DIAGNOSIS — Z1389 Encounter for screening for other disorder: Secondary | ICD-10-CM

## 2019-02-09 DIAGNOSIS — O99213 Obesity complicating pregnancy, third trimester: Secondary | ICD-10-CM

## 2019-02-09 NOTE — Progress Notes (Signed)
See postpartum note

## 2019-02-09 NOTE — Progress Notes (Signed)
Subjective:     Cheryl Acosta is a 43 y.o. female who presents for a postpartum visit. She is 3 weeks postpartum following a spontaneous vaginal delivery. I have fully reviewed the prenatal and intrapartum course. The delivery was at 39.6 gestational weeks. Outcome: spontaneous vaginal delivery. Anesthesia: epidural. Postpartum course has been complicated by PP preeclampsia and CHTN. Baby's course has been normal. Baby is feeding by breast. Bleeding no bleeding. Bowel function is normal. Bladder function is abnormal: some stress incontinence vs. urge. Patient is not sexually active. Contraception method is unsure, IUd vs.Nexplanon. Postpartum depression screening: negative.  The following portions of the patient's history were reviewed and updated as appropriate: allergies, current medications, past family history, past medical history, past social history, past surgical history and problem list.  Review of Systems Pertinent items are noted in HPI.   Objective:    BP 114/79   Pulse 69   Wt 299 lb (135.6 kg)   LMP 05/02/2018   BMI 46.83 kg/m   General:  alert, cooperative and no distress           Abdomen: not distended   Vulva:  not evaluated  Vagina: not evaluated                    Assessment:     normal postpartum exam. Pap smear not done at today's visit.   Plan:    1. Contraception: Nexplanon and or IUd 2. RTC for 2 hr and contraceptive 3. Follow up in: 2 weeks or as needed.    Woodroe Mode, MD 02/09/2019

## 2019-02-09 NOTE — Patient Instructions (Signed)
Contraception Choices Contraception, also called birth control, refers to methods or devices that prevent pregnancy. Hormonal methods Contraceptive implant  A contraceptive implant is a thin, plastic tube that contains a hormone. It is inserted into the upper part of the arm. It can remain in place for up to 3 years. Progestin-only injections Progestin-only injections are injections of progestin, a synthetic form of the hormone progesterone. They are given every 3 months by a health care provider. Birth control pills  Birth control pills are pills that contain hormones that prevent pregnancy. They must be taken once a day, preferably at the same time each day. Birth control patch  The birth control patch contains hormones that prevent pregnancy. It is placed on the skin and must be changed once a week for three weeks and removed on the fourth week. A prescription is needed to use this method of contraception. Vaginal ring  A vaginal ring contains hormones that prevent pregnancy. It is placed in the vagina for three weeks and removed on the fourth week. After that, the process is repeated with a new ring. A prescription is needed to use this method of contraception. Emergency contraceptive Emergency contraceptives prevent pregnancy after unprotected sex. They come in pill form and can be taken up to 5 days after sex. They work best the sooner they are taken after having sex. Most emergency contraceptives are available without a prescription. This method should not be used as your only form of birth control. Barrier methods Female condom  A female condom is a thin sheath that is worn over the penis during sex. Condoms keep sperm from going inside a woman's body. They can be used with a spermicide to increase their effectiveness. They should be disposed after a single use. Female condom  A female condom is a soft, loose-fitting sheath that is put into the vagina before sex. The condom keeps sperm  from going inside a woman's body. They should be disposed after a single use. Diaphragm  A diaphragm is a soft, dome-shaped barrier. It is inserted into the vagina before sex, along with a spermicide. The diaphragm blocks sperm from entering the uterus, and the spermicide kills sperm. A diaphragm should be left in the vagina for 6-8 hours after sex and removed within 24 hours. A diaphragm is prescribed and fitted by a health care provider. A diaphragm should be replaced every 1-2 years, after giving birth, after gaining more than 15 lb (6.8 kg), and after pelvic surgery. Cervical cap  A cervical cap is a round, soft latex or plastic cup that fits over the cervix. It is inserted into the vagina before sex, along with spermicide. It blocks sperm from entering the uterus. The cap should be left in place for 6-8 hours after sex and removed within 48 hours. A cervical cap must be prescribed and fitted by a health care provider. It should be replaced every 2 years. Sponge  A sponge is a soft, circular piece of polyurethane foam with spermicide on it. The sponge helps block sperm from entering the uterus, and the spermicide kills sperm. To use it, you make it wet and then insert it into the vagina. It should be inserted before sex, left in for at least 6 hours after sex, and removed and thrown away within 30 hours. Spermicides Spermicides are chemicals that kill or block sperm from entering the cervix and uterus. They can come as a cream, jelly, suppository, foam, or tablet. A spermicide should be inserted into the   vagina with an applicator at least 14-43 minutes before sex to allow time for it to work. The process must be repeated every time you have sex. Spermicides do not require a prescription. Intrauterine contraception Intrauterine device (IUD) An IUD is a T-shaped device that is put in a woman's uterus. There are two types:  Hormone IUD.This type contains progestin, a synthetic form of the hormone  progesterone. This type can stay in place for 3-5 years.  Copper IUD.This type is wrapped in copper wire. It can stay in place for 10 years.  Permanent methods of contraception Female tubal ligation In this method, a woman's fallopian tubes are sealed, tied, or blocked during surgery to prevent eggs from traveling to the uterus. Hysteroscopic sterilization In this method, a small, flexible insert is placed into each fallopian tube. The inserts cause scar tissue to form in the fallopian tubes and block them, so sperm cannot reach an egg. The procedure takes about 3 months to be effective. Another form of birth control must be used during those 3 months. Female sterilization This is a procedure to tie off the tubes that carry sperm (vasectomy). After the procedure, the man can still ejaculate fluid (semen). Natural planning methods Natural family planning In this method, a couple does not have sex on days when the woman could become pregnant. Calendar method This means keeping track of the length of each menstrual cycle, identifying the days when pregnancy can happen, and not having sex on those days. Ovulation method In this method, a couple avoids sex during ovulation. Symptothermal method This method involves not having sex during ovulation. The woman typically checks for ovulation by watching changes in her temperature and in the consistency of cervical mucus. Post-ovulation method In this method, a couple waits to have sex until after ovulation. Summary  Contraception, also called birth control, means methods or devices that prevent pregnancy.  Hormonal methods of contraception include implants, injections, pills, patches, vaginal rings, and emergency contraceptives.  Barrier methods of contraception can include female condoms, female condoms, diaphragms, cervical caps, sponges, and spermicides.  There are two types of IUDs (intrauterine devices). An IUD can be put in a woman's uterus to  prevent pregnancy for 3-5 years.  Permanent sterilization can be done through a procedure for males, females, or both.  Natural family planning methods involve not having sex on days when the woman could become pregnant. This information is not intended to replace advice given to you by your health care provider. Make sure you discuss any questions you have with your health care provider. Document Released: 07/30/2005 Document Revised: 08/01/2017 Document Reviewed: 09/01/2016 Elsevier Patient Education  2020 [employer].

## 2019-02-23 ENCOUNTER — Ambulatory Visit (INDEPENDENT_AMBULATORY_CARE_PROVIDER_SITE_OTHER): Payer: BC Managed Care – PPO | Admitting: Obstetrics

## 2019-02-23 ENCOUNTER — Encounter: Payer: Self-pay | Admitting: Obstetrics

## 2019-02-23 ENCOUNTER — Other Ambulatory Visit: Payer: BC Managed Care – PPO

## 2019-02-23 ENCOUNTER — Other Ambulatory Visit (HOSPITAL_COMMUNITY)
Admission: RE | Admit: 2019-02-23 | Discharge: 2019-02-23 | Disposition: A | Discharge status: Home / Self Care | Payer: BC Managed Care – PPO | Source: Ambulatory Visit | Attending: Obstetrics | Admitting: Obstetrics

## 2019-02-23 ENCOUNTER — Other Ambulatory Visit: Payer: Self-pay

## 2019-02-23 VITALS — BP 110/78 | HR 60 | Temp 98.5°F | Wt 304.0 lb

## 2019-02-23 DIAGNOSIS — Z3202 Encounter for pregnancy test, result negative: Secondary | ICD-10-CM | POA: Diagnosis not present

## 2019-02-23 DIAGNOSIS — N898 Other specified noninflammatory disorders of vagina: Secondary | ICD-10-CM | POA: Diagnosis present

## 2019-02-23 DIAGNOSIS — Z30430 Encounter for insertion of intrauterine contraceptive device: Secondary | ICD-10-CM

## 2019-02-23 LAB — POCT URINE PREGNANCY: Preg Test, Ur: NEGATIVE

## 2019-02-23 MED ORDER — LEVONORGESTREL 20 MCG/24HR IU IUD
INTRAUTERINE_SYSTEM | Freq: Once | INTRAUTERINE | Status: AC
  Administered 2019-02-23: 10:00:00 via INTRAUTERINE

## 2019-02-23 NOTE — Progress Notes (Signed)
Pt presents for IUD insertion. Denies IC x 14 days

## 2019-02-23 NOTE — Progress Notes (Signed)
IUD Insertion Procedure Note  Pre-operative Diagnosis: Desire Long Acting Reversible Contraception ( LARC )   Post-operative Diagnosis: same  Indications: contraception  Procedure Details  Urine pregnancy test was done in office and result was negative.  The risks (including infection, bleeding, pain, and uterine perforation) and benefits of the procedure were explained to the patient and Written informed consent was obtained.    Cervix cleansed with Betadine. Uterus sounded to 7 cm. IUD inserted without difficulty. String visible and trimmed. Patient tolerated procedure well.  IUD Information: Mirena, Lot # [id number], Expiration date: June 2022.  Condition: Stable  Complications: None  Plan:  The patient was advised to call for any fever or for prolonged or severe pain or bleeding. She was advised to use NSAID as needed for mild to moderate pain.   Attending Physician Documentation: I was present for or participated in the entire procedure, including opening and closing.  Shelly Bombard MD 02-23-2019

## 2019-02-25 LAB — CERVICOVAGINAL ANCILLARY ONLY
Bacterial vaginitis: NEGATIVE
Candida vaginitis: NEGATIVE
Chlamydia: NEGATIVE
Neisseria Gonorrhea: NEGATIVE
Trichomonas: NEGATIVE

## 2019-03-02 ENCOUNTER — Other Ambulatory Visit: Payer: Self-pay

## 2019-03-02 ENCOUNTER — Other Ambulatory Visit: Payer: BC Managed Care – PPO

## 2019-03-02 DIAGNOSIS — O99815 Abnormal glucose complicating the puerperium: Secondary | ICD-10-CM

## 2019-03-03 LAB — GLUCOSE TOLERANCE, 2 HOURS
Glucose, 2 hour: 113 mg/dL (ref 65–139)
Glucose, GTT - Fasting: 91 mg/dL (ref 65–99)

## 2019-03-24 ENCOUNTER — Other Ambulatory Visit: Payer: Self-pay

## 2019-03-24 ENCOUNTER — Ambulatory Visit: Admission: RE | Admit: 2019-03-24 | Payer: BLUE CROSS/BLUE SHIELD | Source: Ambulatory Visit

## 2019-03-24 ENCOUNTER — Ambulatory Visit
Admission: RE | Admit: 2019-03-24 | Discharge: 2019-03-24 | Disposition: A | Discharge status: Home / Self Care | Payer: BC Managed Care – PPO | Source: Ambulatory Visit | Attending: Obstetrics | Admitting: Obstetrics

## 2019-03-24 DIAGNOSIS — N63 Unspecified lump in unspecified breast: Secondary | ICD-10-CM

## 2019-04-06 ENCOUNTER — Ambulatory Visit (INDEPENDENT_AMBULATORY_CARE_PROVIDER_SITE_OTHER): Payer: BC Managed Care – PPO | Admitting: Obstetrics

## 2019-04-06 ENCOUNTER — Encounter: Payer: Self-pay | Admitting: Obstetrics

## 2019-04-06 ENCOUNTER — Other Ambulatory Visit: Payer: Self-pay

## 2019-04-06 VITALS — BP 112/80 | HR 120 | Wt 302.4 lb

## 2019-04-06 DIAGNOSIS — Z30431 Encounter for routine checking of intrauterine contraceptive device: Secondary | ICD-10-CM

## 2019-04-06 DIAGNOSIS — Z975 Presence of (intrauterine) contraceptive device: Secondary | ICD-10-CM | POA: Diagnosis not present

## 2019-04-06 NOTE — Progress Notes (Signed)
Subjective:    Cheryl Acosta  who presents for post insertion 6 week IUD string check. The patient has no complaints today. The patient is sexually active. Pertinent past medical history: none.  Likes her Mirena IUD.  Is not currently bleeding with the IUD.    The information documented in the HPI was reviewed and verified.  Menstrual History: OB History    Gravida Para Term Preterm AB Living   1 1 1     1    SAB TAB Ectopic Multiple Live Births         0 1      No LMP recorded. has Mirena IUD   Patient Active Problem List   Diagnosis Date Noted   Patient Active Problem List   Diagnosis Date Noted  . H/O gestational diabetes in prior pregnancy, currently pregnant 09/08/2018  . Sickle cell trait ([place]) 09/08/2018  . Asthma 09/08/2018  . Breast mass 09/08/2018  . Mild intermittent asthma with acute exacerbation 12/17/2017    No past medical history on file.  Past Surgical History:  Procedure Laterality Date   Past Surgical History:  Procedure Laterality Date  . BREAST SURGERY  2003   BILATERAL LUMPECTOMY   . DILATION AND EVACUATION N/A 12/12/2015   Procedure: DILATATION AND EVACUATION;  Surgeon: Shelly Bombard, MD;  Location: [facility];  Service: Gynecology;  Laterality: N/A;  . THUMB FUSION        Current Outpatient Prescriptions:  No medication comments found. Current Outpatient Medications on File Prior to Visit  Medication Sig Dispense Refill  . Prenat-Fe Poly-Methfol-FA-DHA (VITAFOL ULTRA) 29-0.6-0.4-200 MG CAPS Take 1 tablet by mouth daily before breakfast. 30 capsule 11  . acetaminophen (TYLENOL) 325 MG tablet Take 2 tablets (650 mg total) by mouth every 4 (four) hours as needed (for pain scale < 4). (Patient not taking: Reported on 02/23/2019)    . albuterol (PROVENTIL HFA;VENTOLIN HFA) 108 (90 Base) MCG/ACT inhaler Inhale 2 puffs into the lungs every 6 (six) hours as needed for wheezing or shortness of breath. (Patient not taking: Reported on 02/23/2019) 1 Inhaler 2   . amLODipine (NORVASC) 10 MG tablet Take 1 tablet (10 mg total) by mouth daily. (Patient not taking: Reported on 04/06/2019) 30 tablet 3  . aspirin EC 81 MG tablet Take 1 tablet (81 mg total) by mouth daily. Take after 12 weeks for prevention of preeclampsia later in pregnancy (Patient not taking: Reported on 02/23/2019) 300 tablet 2  . Butalbital-APAP-Caffeine 50-325-40 MG capsule Take 1-2 capsules by mouth every 6 (six) hours as needed for headache. (Patient not taking: Reported on 02/23/2019) 30 capsule 0  . cetirizine (ZYRTEC) 10 MG tablet Take 10 mg by mouth daily as needed for allergies.     . coconut oil OIL Apply 1 application topically as needed. (Patient not taking: Reported on 02/23/2019)  0  . docusate sodium (COLACE) 100 MG capsule Take 1 capsule (100 mg total) by mouth 2 (two) times daily as needed. (Patient not taking: Reported on 02/23/2019) 30 capsule 2  . fluticasone (FLOVENT HFA) 110 MCG/ACT inhaler Inhale 2 puffs into the lungs 2 (two) times daily. (Patient not taking: Reported on 02/23/2019) 1 Inhaler 12  . ibuprofen (ADVIL) 600 MG tablet Take 1 tablet (600 mg total) by mouth every 6 (six) hours. (Patient not taking: Reported on 02/23/2019) 30 tablet 0  . montelukast (SINGULAIR) 10 MG tablet Take 10 mg by mouth at bedtime.    [name] omeprazole (PRILOSEC) 20 MG capsule Take 1  capsule (20 mg total) by mouth 2 (two) times daily before a meal. (Patient not taking: Reported on 02/23/2019) 60 capsule 5  . SYMBICORT 160-4.5 MCG/ACT inhaler Inhale 2 puffs into the lungs 2 (two) times daily.     No current facility-administered medications on file prior to visit.     Allergies  Allergen Reactions  No Known Allergies   Social History  Substance Use Topics   Social History   Socioeconomic History  . Marital status: Married    Spouse name: Not on file  . Number of children: Not on file  . Years of education: Not on file  . Highest education level: Not on file  Occupational History  . Not  on file  Social Needs  . Financial resource strain: Not hard at all  . Food insecurity    Worry: Never true    Inability: Never true  . Transportation needs    Medical: No    Non-medical: Not on file  Tobacco Use  . Smoking status: Never Smoker  . Smokeless tobacco: Never Used  Substance and Sexual Activity  . Alcohol use: No  . Drug use: No  . Sexual activity: Not on file  Lifestyle  . Physical activity    Days per week: Not on file    Minutes per session: Not on file  . Stress: Only a little  Relationships  . Social Herbalist on phone: Not on file    Gets together: Not on file    Attends religious service: Not on file    Active member of club or organization: Not on file    Attends meetings of clubs or organizations: Not on file    Relationship status: Not on file  . Intimate partner violence    Fear of current or ex partner: No    Emotionally abused: No    Physically abused: No    Forced sexual activity: No  Other Topics Concern  . Not on file  Social History Narrative  . Not on file     No family history on file.     Review of Systems Constitutional: negative for weight loss Genitourinary:negative for abnormal menstrual periods and vaginal discharge   Objective:   BP 115/77   Pulse 89   Wt 161 lb (73 kg)   BMI 25.22 kg/m    General:   alert  Skin:   no rash or abnormalities  Lungs:   clear to auscultation bilaterally  Heart:   regular rate and rhythm, S1, S2 normal, no murmur, click, rub or gallop  Breasts:   deferred  Abdomen:  normal findings: no organomegaly, soft, non-tender and no hernia  Pelvis:  External genitalia: normal general appearance Urinary system: urethral meatus normal and bladder without fullness, nontender Vaginal: normal without tenderness, induration or masses Cervix: normal appearance, IUD strings present Adnexa: normal bimanual exam Uterus: anteverted and non-tender, normal size   Lab Review Urine pregnancy  test Labs reviewed yes Radiologic studies reviewed no  50% of 15 min visit spent on counseling and coordination of care.    Assessment:    43 y.o., continuing IUD, no contraindications.   Plan:    All questions answered.  No orders of the defined types were placed in this encounter.  No orders of the defined types were placed in this encounter.  Need to obtain previous records Follow up as needed or in 1 year for annual exam.

## 2019-04-06 NOTE — Progress Notes (Signed)
Pt is here for IUD string check, Mirena inserted on 02/23/19. Pt has no complaints today.

## 2019-07-12 ENCOUNTER — Other Ambulatory Visit: Payer: Self-pay | Admitting: Family Medicine

## 2019-08-24 ENCOUNTER — Other Ambulatory Visit: Payer: Self-pay

## 2019-08-24 DIAGNOSIS — B9689 Other specified bacterial agents as the cause of diseases classified elsewhere: Secondary | ICD-10-CM

## 2019-08-24 DIAGNOSIS — N760 Acute vaginitis: Secondary | ICD-10-CM

## 2019-08-24 MED ORDER — METRONIDAZOLE 500 MG PO TABS: 500.0000 mg | ORAL_TABLET | Freq: Two times a day (BID) | ORAL | 0 refills | Status: DC

## 2019-08-24 NOTE — Progress Notes (Signed)
Flagyl rx for symptoms of bacterial vaginosis. Pt made aware.

## 2019-12-06 ENCOUNTER — Ambulatory Visit (INDEPENDENT_AMBULATORY_CARE_PROVIDER_SITE_OTHER)
Admission: RE | Admit: 2019-12-06 | Discharge: 2019-12-06 | Disposition: A | Discharge status: Home / Self Care | Payer: Medicaid Other | Source: Ambulatory Visit

## 2019-12-06 ENCOUNTER — Ambulatory Visit
Admission: EM | Admit: 2019-12-06 | Discharge: 2019-12-06 | Disposition: A | Discharge status: Home / Self Care | Payer: Medicaid Other | Attending: Emergency Medicine | Admitting: Emergency Medicine

## 2019-12-06 DIAGNOSIS — Z20822 Contact with and (suspected) exposure to covid-19: Secondary | ICD-10-CM

## 2019-12-06 DIAGNOSIS — J069 Acute upper respiratory infection, unspecified: Secondary | ICD-10-CM

## 2019-12-06 MED ORDER — PREDNISONE 20 MG PO TABS: 40.0000 mg | ORAL_TABLET | Freq: Every day | ORAL | 0 refills | Status: AC

## 2019-12-06 MED ORDER — CETIRIZINE HCL 10 MG PO TABS: 10.0000 mg | ORAL_TABLET | Freq: Every day | ORAL | 0 refills | Status: AC | PRN

## 2019-12-06 NOTE — ED Provider Notes (Signed)
Virtual Visit via Video Note:  Cheryl Acosta  initiated request for Telemedicine visit with [facility] team. I connected with Cheryl Acosta  on 12/06/2019 at 10:26 AM  for a synchronized telemedicine visit using a video enabled HIPPA compliant telemedicine application. I verified that I am speaking with Cheryl Acosta  using two identifiers. [name], PA-C  was physically located in a [facility] site and Blossie Howton was located at a different location.   The limitations of evaluation and management by telemedicine as well as the availability of in-person appointments were discussed. Patient was informed that she  may incur a bill ( including co-pay) for this virtual visit encounter. Cheryl Acosta  expressed understanding and gave verbal consent to proceed with virtual visit.     History of Present Illness:Cheryl Acosta  is a 44 y.o. female with history of obesity, diabetes, asthma, allergies   Presenting for Covid testing: Exposure: husband who became symptomatic last monday Date of exposure: chronic/cohabitat Any fever, symptoms since exposure: Yes: Allergy symptoms for last 5 days, though has developed a worsening cough.  States this is nonproductive, relieved by Mucinex DM.  Patient does have laryngitis.  Taking Flonase, no allergy medications.  Taking Flovent inhaler 2 puffs daily, has not needed Ventolin inhaler.  Denying fever, difficulty breathing or chest pain.  Has not been Covid tested.    ROI as per HPI  Past Medical History:  Diagnosis Date  . Anemia    not on Iron  . Asthma    on Symbicort inhaler; uses prn; last used 04/28/17  . Diabetes mellitus without complication ([place])   . Environmental allergies   . GERD (gastroesophageal reflux disease)    during pregnancy only  . Gestational diabetes    diet-controlled    No Known Allergies      Observations/Objective: 44 year old female sitting in no acute distress.  Patient is able to speak in  full sentences without coughing, sneezing, wheezing.  Mild voice hoarseness noted.  No hot potato voice.  Assessment and Plan: H&P consistent with viral URI with cough, seasonal allergies, though cannot exclude Covid given close exposure.  Discussed utility of Covid testing: Patient electing to get tested.  Given history of asthma and cough will start antihistamine, prednisone, and have patient follow-up with PCP next week for further evaluation if needed.  Return precautions discussed, patient verbalized understanding and is agreeable to plan.  Follow Up Instructions: Patient to come to [facility] urgent care for Covid testing.   I discussed the assessment and treatment plan with the patient. The patient was provided an opportunity to ask questions and all were answered. The patient agreed with the plan and demonstrated an understanding of the instructions.   The patient was advised to call back or seek an in-person evaluation if the symptoms worsen or if the condition fails to improve as anticipated.  I provided 15 minutes of non-face-to-face time during this encounter.    Anderson, PA-C  12/06/2019 10:26 AM        Hall-Potvin, [place], PA-C 12/06/19 1227

## 2019-12-06 NOTE — ED Triage Notes (Signed)
Seen by provider only, department visit for lab only.  Prior to visit had e-visit

## 2019-12-06 NOTE — Discharge Instructions (Signed)
Start flonase, atrovent nasal spray for nasal congestion/drainage. You can use over the counter nasal saline rinse such as neti pot for nasal congestion. Keep hydrated, your urine should be clear to pale yellow in color. Tylenol/motrin for fever and pain. Monitor for any worsening of symptoms, chest pain, shortness of breath, wheezing, swelling of the throat, go to the emergency department for further evaluation needed.  

## 2019-12-07 LAB — NOVEL CORONAVIRUS, NAA: SARS-CoV-2, NAA: DETECTED — AB

## 2019-12-07 LAB — SARS-COV-2, NAA 2 DAY TAT

## 2019-12-08 ENCOUNTER — Ambulatory Visit (INDEPENDENT_AMBULATORY_CARE_PROVIDER_SITE_OTHER)
Admission: RE | Admit: 2019-12-08 | Discharge: 2019-12-08 | Disposition: A | Discharge status: Home / Self Care | Payer: Medicaid Other | Source: Ambulatory Visit

## 2019-12-08 ENCOUNTER — Telehealth: Payer: Self-pay | Admitting: Unknown Physician Specialty

## 2019-12-08 ENCOUNTER — Other Ambulatory Visit: Payer: Self-pay

## 2019-12-08 DIAGNOSIS — U071 COVID-19: Secondary | ICD-10-CM | POA: Diagnosis not present

## 2019-12-08 NOTE — Discharge Instructions (Signed)
You should self-quarantine for:  *10 days since your symptoms first appeared and  *24 hours with no fever, without the use of fever-reducing medications and  *your other symptoms of COVID are improving.  Most people do not need to be re-tested at the end of the quarantine period.    Go to the emergency department if you have high fever not relieved by Tylenol, shortness of breath, severe diarrhea, or other concerning symptoms.      

## 2019-12-08 NOTE — Telephone Encounter (Signed)
Called to discuss with Cheryl Acosta about Covid symptoms and the use of bamlanivimab, a monoclonal antibody infusion for those with mild to moderate Covid symptoms and at a high risk of hospitalization.     Pt is qualified for this infusion at the [facility] infusion center due to co-morbid conditions and/or a member of an at-risk group, however declines infusion at this time. Symptoms tier reviewed as well as criteria for ending isolation.  Symptoms reviewed that would warrant ED/Hospital evaluation. Preventative practices reviewed. Patient verbalized understanding. Patient advised to call back if he decides that he does want to get infusion. Callback number to the infusion center given. Patient advised to go to Urgent care or ED with severe symptoms. Last date she would be eligible for infusion is 4/29.       Patient Active Problem List   Diagnosis Date Noted  . H/O gestational diabetes in prior pregnancy, currently pregnant 09/08/2018  . Sickle cell trait ([name]) 09/08/2018  . Asthma 09/08/2018  . Breast mass 09/08/2018  . Mild intermittent asthma with acute exacerbation 12/17/2017

## 2019-12-08 NOTE — ED Provider Notes (Signed)
Virtual Visit via Video Note:  Cheryl Acosta  initiated request for Telemedicine visit with [facility] team. I connected with Cheryl Acosta  on 12/08/2019 at 10:43 AM  for a synchronized telemedicine visit using a video enabled HIPPA compliant telemedicine application. I verified that I am speaking with Cheryl Acosta  using two identifiers. Sharion Balloon, NP  was physically located in a [facility] Urgent care site and Mieshia Reffitt was located at a different location.   The limitations of evaluation and management by telemedicine as well as the availability of in-person appointments were discussed. Patient was informed that she  may incur a bill ( including co-pay) for this virtual visit encounter. Cheryl Acosta  expressed understanding and gave verbal consent to proceed with virtual visit.     History of Present Illness:Cheryl Acosta  is a 44 y.o. female presents for evaluation of mild nonproductive cough and hoarse voice x 1 week.  She tested COVID positive on 12/06/2019.  Patient has purchased an O2 sat but it has not arrived yet; she will pick it up this afternoon and will monitor her O2 sat at home.  She has been offered the [place] and is still considering it.  She is taking Mucinex DM, albuterol inhaler, and prednisone.  She denies fever, shortness of breath, or other symptoms.     No Known Allergies   Past Medical History:  Diagnosis Date  . Anemia    not on Iron  . Asthma    on Symbicort inhaler; uses prn; last used 04/28/17  . Diabetes mellitus without complication ([place])   . Environmental allergies   . GERD (gastroesophageal reflux disease)    during pregnancy only  . Gestational diabetes    diet-controlled     Social History   Tobacco Use  . Smoking status: Never Smoker  . Smokeless tobacco: Never Used  Substance Use Topics  . Alcohol use: No  . Drug use: No   ROS: as stated in HPI.  All other systems reviewed and negative.       Observations/Objective: Physical Exam  VITALS: Patient denies fever. GENERAL: Alert, appears well and in no acute distress. HEENT: Atraumatic. NECK: Normal movements of the head and neck. CARDIOPULMONARY: No increased WOB. Speaking in clear sentences. I:E ratio WNL.  MS: Moves all visible extremities without noticeable abnormality. PSYCH: Pleasant and cooperative, well-groomed. Speech normal rate and rhythm. Affect is appropriate. Insight and judgement are appropriate. Attention is focused, linear, and appropriate.  NEURO: CN grossly intact. Oriented as arrived to appointment on time with no prompting. Moves both UE equally.  SKIN: No obvious lesions, wounds, erythema, or cyanosis noted on face or hands.   Assessment and Plan:    ICD-10-CM   1. COVID-19  U07.1        Follow Up Instructions: Instructed patient to continue taking her current medications.  Instructed her to strongly consider the infusion offered by the outpatient infusion clinic.  Instructed her to come to the UC or go to the ED if she has shortness of breath, high fever, or other concerning symptoms.  Instructed her to self quarantine per CDC guidelines.  COVID information provided through [place].  Patient agrees to plan of care.       I discussed the assessment and treatment plan with the patient. The patient was provided an opportunity to ask questions and all were answered. The patient agreed with the plan and demonstrated an understanding of the instructions.  The patient was advised to call back or seek an in-person evaluation if the symptoms worsen or if the condition fails to improve as anticipated.      Sharion Balloon, NP  12/08/2019 10:43 AM         Sharion Balloon, NP 12/08/19 1043

## 2020-08-13 DIAGNOSIS — Z419 Encounter for procedure for purposes other than remedying health state, unspecified: Secondary | ICD-10-CM | POA: Diagnosis not present

## 2020-08-17 DIAGNOSIS — J208 Acute bronchitis due to other specified organisms: Secondary | ICD-10-CM | POA: Diagnosis not present

## 2020-08-17 DIAGNOSIS — B9689 Other specified bacterial agents as the cause of diseases classified elsewhere: Secondary | ICD-10-CM | POA: Diagnosis not present

## 2020-08-22 ENCOUNTER — Encounter (HOSPITAL_BASED_OUTPATIENT_CLINIC_OR_DEPARTMENT_OTHER): Payer: Self-pay | Admitting: Emergency Medicine

## 2020-08-22 ENCOUNTER — Emergency Department (HOSPITAL_BASED_OUTPATIENT_CLINIC_OR_DEPARTMENT_OTHER)
Admission: EM | Admit: 2020-08-22 | Discharge: 2020-08-22 | Disposition: A | Discharge status: Home / Self Care | Payer: BC Managed Care – PPO | Attending: Emergency Medicine | Admitting: Emergency Medicine

## 2020-08-22 ENCOUNTER — Emergency Department (HOSPITAL_BASED_OUTPATIENT_CLINIC_OR_DEPARTMENT_OTHER): Payer: BC Managed Care – PPO

## 2020-08-22 ENCOUNTER — Other Ambulatory Visit: Payer: Self-pay

## 2020-08-22 DIAGNOSIS — R059 Cough, unspecified: Secondary | ICD-10-CM | POA: Diagnosis present

## 2020-08-22 DIAGNOSIS — J069 Acute upper respiratory infection, unspecified: Secondary | ICD-10-CM | POA: Insufficient documentation

## 2020-08-22 DIAGNOSIS — Z7951 Long term (current) use of inhaled steroids: Secondary | ICD-10-CM | POA: Diagnosis not present

## 2020-08-22 DIAGNOSIS — E119 Type 2 diabetes mellitus without complications: Secondary | ICD-10-CM | POA: Insufficient documentation

## 2020-08-22 DIAGNOSIS — J4521 Mild intermittent asthma with (acute) exacerbation: Secondary | ICD-10-CM | POA: Insufficient documentation

## 2020-08-22 NOTE — ED Provider Notes (Signed)
[place] EMERGENCY DEPARTMENT Provider Note   CSN: [id number] Arrival date & time: 08/22/20  1304     History Chief Complaint  Patient presents with  . Follow-up  . Pneumonia    Cheryl Acosta is a 45 y.o. female.  Patient presents with concern for pnuemonia.  She states she has had a cough for 12-14 days.  No fevers.  She went to see her nurse at work today, and was told she had diminished breath sounds in the left lung.  She contacted her doctor and was advised to go to the ER.  She states her cough has not improved, but she has no fevers, no pain, no vomiting, no diarrhea.        Past Medical History:  Diagnosis Date  . Anemia    not on Iron  . Asthma    on Symbicort inhaler; uses prn; last used 04/28/17  . Diabetes mellitus without complication ([place])   . Environmental allergies   . GERD (gastroesophageal reflux disease)    during pregnancy only  . Gestational diabetes    diet-controlled    Patient Active Problem List   Diagnosis Date Noted  . H/O gestational diabetes in prior pregnancy, currently pregnant 09/08/2018  . Sickle cell trait ([place]) 09/08/2018  . Asthma 09/08/2018  . Breast mass 09/08/2018  . Mild intermittent asthma with acute exacerbation 12/17/2017    Past Surgical History:  Procedure Laterality Date  . BREAST SURGERY  2003   BILATERAL LUMPECTOMY   . DILATION AND EVACUATION N/A 12/12/2015   Procedure: DILATATION AND EVACUATION;  Surgeon: Shelly Bombard, MD;  Location: [facility];  Service: Gynecology;  Laterality: N/A;  . THUMB FUSION       OB History    Gravida  5   Para  4   Term  4   Preterm  0   AB  1   Living  4     SAB  0   IAB  1   Ectopic  0   Multiple  0   Live Births  4           Family History  Problem Relation Age of Onset  . Diabetes Mother     Social History   Tobacco Use  . Smoking status: Never Smoker  . Smokeless tobacco: Never Used  Vaping Use  . Vaping Use: Never used  Substance  Use Topics  . Alcohol use: No  . Drug use: No    Home Medications Prior to Admission medications   Medication Sig Start Date End Date Taking? Authorizing Provider  benzonatate (TESSALON) 100 MG capsule Take by mouth. 08/19/20 08/29/20 Yes [provider]  doxycycline (VIBRAMYCIN) 100 MG capsule Take by mouth. 08/17/20 08/24/20 Yes [provider]  predniSONE (STERAPRED UNI-PAK 21 TAB) 10 MG (21) TBPK tablet Take by mouth. 08/19/20 08/31/20 Yes [provider]  acetaminophen (TYLENOL) 325 MG tablet Take 2 tablets (650 mg total) by mouth every 4 (four) hours as needed (for pain scale < 4). Patient not taking: Reported on 02/23/2019 01/13/19   Wilber Oliphant, MD  albuterol (PROVENTIL HFA;VENTOLIN HFA) 108 (90 Base) MCG/ACT inhaler Inhale 2 puffs into the lungs every 6 (six) hours as needed for wheezing or shortness of breath. Patient not taking: Reported on 02/23/2019 11/06/18   Emily Filbert, MD  cetirizine (ZYRTEC) 10 MG tablet Take 1 tablet (10 mg total) by mouth daily as needed for allergies. 12/06/19   Hall-Potvin, [place], PA-C  fluticasone (FLOVENT HFA) 110 MCG/ACT inhaler Inhale 2 puffs into the lungs 2 (two) times daily. Patient not taking: Reported on 02/23/2019 11/06/18   Emily Filbert, MD  montelukast (SINGULAIR) 10 MG tablet Take 10 mg by mouth at bedtime.    [provider]    Allergies    Patient has no known allergies.  Review of Systems   Review of Systems  Constitutional: Negative for fever.  HENT: Negative for ear pain.   Eyes: Negative for pain.  Respiratory: Positive for cough.   Cardiovascular: Negative for chest pain.  Gastrointestinal: Negative for abdominal pain.  Genitourinary: Negative for flank pain.  Musculoskeletal: Negative for back pain.  Skin: Negative for rash.  Neurological: Negative for headaches.    Physical Exam Updated Vital Signs BP 136/81 (BP Location: Right Arm)   Pulse (!) 57   Temp 98.2 F (36.8 C) (Oral)   Resp 19    LMP 08/21/2020   SpO2 100%   Physical Exam Constitutional:      General: She is not in acute distress.    Appearance: Normal appearance.  HENT:     Head: Normocephalic.     Nose: Nose normal.  Eyes:     Extraocular Movements: Extraocular movements intact.  Cardiovascular:     Rate and Rhythm: Normal rate.  Pulmonary:     Effort: Pulmonary effort is normal. No respiratory distress.     Breath sounds: Normal breath sounds. No wheezing.  Musculoskeletal:        General: Normal range of motion.     Cervical back: Normal range of motion.  Neurological:     General: No focal deficit present.     Mental Status: She is alert. Mental status is at baseline.     ED Results / Procedures / Treatments   Labs (all labs ordered are listed, but only abnormal results are displayed) Labs Reviewed - No data to display  EKG None  Radiology DG Chest 2 View  Result Date: 08/22/2020 CLINICAL DATA:  Follow-up pneumonia EXAM: CHEST - 2 VIEW COMPARISON:  02/09/2020 FINDINGS: The heart size and mediastinal contours are within normal limits. No focal airspace consolidation, pleural effusion, or pneumothorax. The visualized skeletal structures are unremarkable. IMPRESSION: No active cardiopulmonary disease. Electronically Signed   By: Davina Poke D.O.   On: 08/22/2020 14:01    Procedures Procedures (including critical care time)  Medications Ordered in ED Medications - No data to display  ED Course  I have reviewed the triage vital signs and the nursing notes.  Pertinent labs & imaging results that were available during my care of the patient were reviewed by me and considered in my medical decision making (see chart for details).    MDM Rules/Calculators/A&P                          Patient with persistent cough, CXR is clear per radiology.  Patient refused COVID swab.  I advised continued isolation until her symptoms resolve.  EG shows sinus rhythm, normal rate, no ST elevation,  no ST depressions noted per my read.   Advise follow up with her doctor in 2-3 days, advise immediate return for worsening symptoms, difficulty breathing, fevers, pain or any other concerns.  Final Clinical Impression(s) / ED Diagnoses Final diagnoses:  Upper respiratory tract infection, unspecified type    Rx / DC Orders ED Discharge Orders    None       [place], Vonna Kotyk  S, MD 08/22/20 1554

## 2020-08-22 NOTE — ED Triage Notes (Signed)
Pt seen recently and diagnosed with pneumonia.  Pt states it doesn't feel much better and called her PCP.  Pt told to come to ED for re-evaluation.

## 2020-08-22 NOTE — Discharge Instructions (Addendum)
Continue isolation at home until your symptoms have resolved and 5 days of pass.  Return to the ER if you have worsening symptoms, difficulty breathing or any additional concerns.  Otherwise follow-up with your doctor in 1 or 2 days.

## 2020-08-22 NOTE — ED Notes (Signed)
Pt. Came here due to feeling a heavy feeling on R side of her chest.  Pt. Said the nurse at work said she heard less air flow in her lungs on the L side.  Pt. Here for EKG

## 2020-08-22 NOTE — ED Notes (Signed)
EKG being done by EMT Edison Nasuti at present time.

## 2020-09-13 DIAGNOSIS — Z419 Encounter for procedure for purposes other than remedying health state, unspecified: Secondary | ICD-10-CM | POA: Diagnosis not present

## 2020-10-11 DIAGNOSIS — Z419 Encounter for procedure for purposes other than remedying health state, unspecified: Secondary | ICD-10-CM | POA: Diagnosis not present

## 2020-11-11 DIAGNOSIS — Z419 Encounter for procedure for purposes other than remedying health state, unspecified: Secondary | ICD-10-CM | POA: Diagnosis not present

## 2020-12-11 DIAGNOSIS — Z419 Encounter for procedure for purposes other than remedying health state, unspecified: Secondary | ICD-10-CM | POA: Diagnosis not present

## 2021-01-11 DIAGNOSIS — Z419 Encounter for procedure for purposes other than remedying health state, unspecified: Secondary | ICD-10-CM | POA: Diagnosis not present

## 2021-02-10 DIAGNOSIS — Z419 Encounter for procedure for purposes other than remedying health state, unspecified: Secondary | ICD-10-CM | POA: Diagnosis not present

## 2021-03-13 DIAGNOSIS — Z419 Encounter for procedure for purposes other than remedying health state, unspecified: Secondary | ICD-10-CM | POA: Diagnosis not present

## 2021-04-13 DIAGNOSIS — Z419 Encounter for procedure for purposes other than remedying health state, unspecified: Secondary | ICD-10-CM | POA: Diagnosis not present

## 2021-05-13 DIAGNOSIS — Z419 Encounter for procedure for purposes other than remedying health state, unspecified: Secondary | ICD-10-CM | POA: Diagnosis not present

## 2021-06-13 DIAGNOSIS — Z419 Encounter for procedure for purposes other than remedying health state, unspecified: Secondary | ICD-10-CM | POA: Diagnosis not present

## 2021-07-13 DIAGNOSIS — Z419 Encounter for procedure for purposes other than remedying health state, unspecified: Secondary | ICD-10-CM | POA: Diagnosis not present

## 2021-08-13 DIAGNOSIS — Z419 Encounter for procedure for purposes other than remedying health state, unspecified: Secondary | ICD-10-CM | POA: Diagnosis not present

## 2021-09-13 DIAGNOSIS — Z419 Encounter for procedure for purposes other than remedying health state, unspecified: Secondary | ICD-10-CM | POA: Diagnosis not present

## 2021-10-11 DIAGNOSIS — Z419 Encounter for procedure for purposes other than remedying health state, unspecified: Secondary | ICD-10-CM | POA: Diagnosis not present

## 2021-11-11 DIAGNOSIS — Z419 Encounter for procedure for purposes other than remedying health state, unspecified: Secondary | ICD-10-CM | POA: Diagnosis not present

## 2021-12-11 DIAGNOSIS — Z419 Encounter for procedure for purposes other than remedying health state, unspecified: Secondary | ICD-10-CM | POA: Diagnosis not present

## 2022-01-11 DIAGNOSIS — Z419 Encounter for procedure for purposes other than remedying health state, unspecified: Secondary | ICD-10-CM | POA: Diagnosis not present

## 2022-02-10 DIAGNOSIS — Z419 Encounter for procedure for purposes other than remedying health state, unspecified: Secondary | ICD-10-CM | POA: Diagnosis not present

## 2022-03-13 DIAGNOSIS — Z419 Encounter for procedure for purposes other than remedying health state, unspecified: Secondary | ICD-10-CM | POA: Diagnosis not present

## 2022-04-13 DIAGNOSIS — Z419 Encounter for procedure for purposes other than remedying health state, unspecified: Secondary | ICD-10-CM | POA: Diagnosis not present

## 2022-05-13 DIAGNOSIS — Z419 Encounter for procedure for purposes other than remedying health state, unspecified: Secondary | ICD-10-CM | POA: Diagnosis not present

## 2022-06-13 DIAGNOSIS — Z419 Encounter for procedure for purposes other than remedying health state, unspecified: Secondary | ICD-10-CM | POA: Diagnosis not present

## 2022-06-20 ENCOUNTER — Other Ambulatory Visit (HOSPITAL_COMMUNITY): Payer: Self-pay

## 2022-06-20 MED ORDER — INSULIN PEN NEEDLE 31G X 8 MM MISC
2 refills | Status: AC
  Filled 2022-06-20: qty 100, 90d supply, fill #0
  Filled 2023-01-03: qty 100, 90d supply, fill #1

## 2022-06-20 MED ORDER — SAXENDA 18 MG/3ML ~~LOC~~ SOPN
2.4000 mg | PEN_INJECTOR | Freq: Every day | SUBCUTANEOUS | 0 refills | Status: DC
  Filled 2022-06-20: qty 12, 30d supply, fill #0

## 2022-07-13 DIAGNOSIS — Z419 Encounter for procedure for purposes other than remedying health state, unspecified: Secondary | ICD-10-CM | POA: Diagnosis not present

## 2022-07-24 ENCOUNTER — Other Ambulatory Visit (HOSPITAL_COMMUNITY): Payer: Self-pay

## 2022-07-24 MED ORDER — SAXENDA 18 MG/3ML ~~LOC~~ SOPN
PEN_INJECTOR | SUBCUTANEOUS | 2 refills | Status: AC
  Filled 2022-07-24 – 2022-08-08 (×3): qty 12, 24d supply, fill #0
  Filled 2023-01-03: qty 12, 24d supply, fill #1

## 2022-08-07 ENCOUNTER — Other Ambulatory Visit (HOSPITAL_COMMUNITY): Payer: Self-pay

## 2022-08-08 ENCOUNTER — Other Ambulatory Visit (HOSPITAL_COMMUNITY): Payer: Self-pay

## 2022-08-09 ENCOUNTER — Other Ambulatory Visit (HOSPITAL_COMMUNITY): Payer: Self-pay

## 2022-09-13 DIAGNOSIS — Z419 Encounter for procedure for purposes other than remedying health state, unspecified: Secondary | ICD-10-CM | POA: Diagnosis not present

## 2022-10-12 DIAGNOSIS — Z419 Encounter for procedure for purposes other than remedying health state, unspecified: Secondary | ICD-10-CM | POA: Diagnosis not present

## 2022-10-23 ENCOUNTER — Other Ambulatory Visit (HOSPITAL_COMMUNITY): Payer: Self-pay

## 2022-10-23 MED ORDER — ZEPBOUND 2.5 MG/0.5ML ~~LOC~~ SOAJ
2.5000 mg | SUBCUTANEOUS | 0 refills | Status: AC
  Filled 2022-10-23: qty 2, 28d supply, fill #0

## 2022-10-23 MED ORDER — DICLOFENAC SODIUM 75 MG PO TBEC
75.0000 mg | DELAYED_RELEASE_TABLET | Freq: Two times a day (BID) | ORAL | 1 refills | Status: DC
  Filled 2022-10-23: qty 30, 15d supply, fill #0

## 2022-11-06 ENCOUNTER — Other Ambulatory Visit (HOSPITAL_COMMUNITY): Payer: Self-pay

## 2022-11-08 ENCOUNTER — Other Ambulatory Visit (HOSPITAL_COMMUNITY): Payer: Self-pay

## 2022-11-12 DIAGNOSIS — Z419 Encounter for procedure for purposes other than remedying health state, unspecified: Secondary | ICD-10-CM | POA: Diagnosis not present

## 2022-12-12 DIAGNOSIS — Z419 Encounter for procedure for purposes other than remedying health state, unspecified: Secondary | ICD-10-CM | POA: Diagnosis not present

## 2023-01-03 ENCOUNTER — Other Ambulatory Visit (HOSPITAL_COMMUNITY): Payer: Self-pay

## 2023-01-12 DIAGNOSIS — Z419 Encounter for procedure for purposes other than remedying health state, unspecified: Secondary | ICD-10-CM | POA: Diagnosis not present

## 2023-02-11 DIAGNOSIS — Z419 Encounter for procedure for purposes other than remedying health state, unspecified: Secondary | ICD-10-CM | POA: Diagnosis not present

## 2023-03-14 DIAGNOSIS — Z419 Encounter for procedure for purposes other than remedying health state, unspecified: Secondary | ICD-10-CM | POA: Diagnosis not present

## 2023-04-14 DIAGNOSIS — Z419 Encounter for procedure for purposes other than remedying health state, unspecified: Secondary | ICD-10-CM | POA: Diagnosis not present

## 2023-05-14 DIAGNOSIS — Z419 Encounter for procedure for purposes other than remedying health state, unspecified: Secondary | ICD-10-CM | POA: Diagnosis not present

## 2023-06-14 DIAGNOSIS — Z419 Encounter for procedure for purposes other than remedying health state, unspecified: Secondary | ICD-10-CM | POA: Diagnosis not present

## 2023-07-29 ENCOUNTER — Encounter: Payer: Self-pay | Admitting: Obstetrics

## 2023-07-29 ENCOUNTER — Ambulatory Visit (INDEPENDENT_AMBULATORY_CARE_PROVIDER_SITE_OTHER): Payer: BC Managed Care – PPO | Admitting: Obstetrics

## 2023-07-29 VITALS — BP 127/88 | HR 64 | Ht 67.0 in | Wt 280.6 lb

## 2023-07-29 DIAGNOSIS — Z3009 Encounter for other general counseling and advice on contraception: Secondary | ICD-10-CM | POA: Diagnosis not present

## 2023-07-29 LAB — POCT URINE PREGNANCY: Preg Test, Ur: NEGATIVE

## 2023-07-29 NOTE — Progress Notes (Signed)
Pt is in the office for IUD follow up. Pt reports that she got Mirena IUD on 02/23/2019 Pt states that the wrong year for removal was written on IUD card and she thought it was due to be removed in 2024. Pt states that for a few months she has been having breast tenderness, bloating, constipation, and possible leakage of milk for the past few months. Pt states that she had a negative UPT at home and a negative UPT at urgent care last week.  Pt reports no issues with IUD

## 2023-07-29 NOTE — Progress Notes (Signed)
Subjective:    Cheryl Acosta is a 47 y.o. female who presents for contraception counseling. The patient has no complaints today. The patient is sexually active. Pertinent past medical history:  Diabetes, anemia, asthma  The information documented in the HPI was reviewed and verified.  Menstrual History: OB History     Gravida  5   Para  4   Term  4   Preterm  0   AB  1   Living  4      SAB  0   IAB  1   Ectopic  0   Multiple  0   Live Births  4            No LMP recorded. (Menstrual status: IUD).   Patient Active Problem List   Diagnosis Date Noted   H/O gestational diabetes in prior pregnancy, currently pregnant 09/08/2018   Sickle cell trait ([facility]) 09/08/2018   Asthma 09/08/2018   Breast mass 09/08/2018   Mild intermittent asthma with acute exacerbation 12/17/2017   Past Medical History:  Diagnosis Date   Anemia    not on Iron   Asthma    on Symbicort inhaler; uses prn; last used 04/28/17   Diabetes mellitus without complication ([facility])    Environmental allergies    GERD (gastroesophageal reflux disease)    during pregnancy only   Gestational diabetes    diet-controlled    Past Surgical History:  Procedure Laterality Date   BREAST SURGERY  2003   BILATERAL LUMPECTOMY    DILATION AND EVACUATION N/A 12/12/2015   Procedure: DILATATION AND EVACUATION;  Surgeon: Brock Bad, MD;  Location: [facility];  Service: Gynecology;  Laterality: N/A;   THUMB FUSION       Current Outpatient Medications:    acetaminophen (TYLENOL) 325 MG tablet, Take 2 tablets (650 mg total) by mouth every 4 (four) hours as needed (for pain scale < 4). (Patient not taking: Reported on 02/23/2019), Disp: , Rfl:    albuterol (PROVENTIL HFA;VENTOLIN HFA) 108 (90 Base) MCG/ACT inhaler, Inhale 2 puffs into the lungs every 6 (six) hours as needed for wheezing or shortness of breath. (Patient not taking: Reported on 02/23/2019), Disp: 1 Inhaler, Rfl: 2   cetirizine (ZYRTEC) 10 MG tablet,  Take 1 tablet (10 mg total) by mouth daily as needed for allergies., Disp: 30 tablet, Rfl: 0   fluticasone (FLOVENT HFA) 110 MCG/ACT inhaler, Inhale 2 puffs into the lungs 2 (two) times daily. (Patient not taking: Reported on 02/23/2019), Disp: 1 Inhaler, Rfl: 12   Insulin Pen Needle 31G X 8 MM MISC, Use as Directed, Disp: 100 each, Rfl: 2   Liraglutide -Weight Management (SAXENDA) 18 MG/3ML SOPN, Inject 1.8 mg daily for 7-10 days, then increase to 2.4 mg daily for 7-10 days, then increase to 3 mg daily thereafter., Disp: 12 mL, Rfl: 2   montelukast (SINGULAIR) 10 MG tablet, Take 10 mg by mouth at bedtime., Disp: , Rfl:    tirzepatide (ZEPBOUND) 2.5 MG/0.5ML Pen, Inject 2.5 mg into the skin once a week., Disp: 2 mL, Rfl: 0 No Known Allergies  Social History   Tobacco Use   Smoking status: Never   Smokeless tobacco: Never  Substance Use Topics   Alcohol use: No    Family History  Problem Relation Age of Onset   Diabetes Mother        Review of Systems Constitutional: negative for weight loss Genitourinary:negative for abnormal menstrual periods and vaginal discharge   Objective:  BP 127/88   Pulse 64   Ht 5\' 7"  (1.702 m)   Wt 280 lb 9.6 oz (127.3 kg)   BMI 43.95 kg/m    General:   Alert and no distress  Skin:   no rash or abnormalities  Lungs:   clear to auscultation bilaterally  Heart:   regular rate and rhythm, S1, S2 normal, no murmur, click, rub or gallop  The remainder of physical exam deferred.  Lab Review Urine pregnancy test:  Negative Labs reviewed yes Radiologic studies reviewed no  I have spent a total of 20 minutes of face-to-face time, excluding clinical staff time, reviewing notes and preparing to see patient, ordering tests and/or medications, and counseling the patient.   Assessment:    47 y.o., continuing IUD, no contraindications.   Plan:   1. Encounter for other general counseling and advice on contraception (Primary) - wants to continue IUD  for 7-8 years - POCT urine pregnancy:  Negative  All questions answered. Contraception: IUD. Discussed healthy lifestyle modifications. Follow up in 4 weeks.  Annual / Pap   Brock Bad, MD, FACOG Attending Obstetrician & Gynecologist, [facility], [facility], [place] 07/29/2023

## 2023-08-27 ENCOUNTER — Encounter: Payer: Self-pay | Admitting: Obstetrics

## 2023-08-27 ENCOUNTER — Other Ambulatory Visit (HOSPITAL_COMMUNITY)
Admission: RE | Admit: 2023-08-27 | Discharge: 2023-08-27 | Disposition: A | Discharge status: Home / Self Care | Payer: BC Managed Care – PPO | Source: Ambulatory Visit | Attending: Obstetrics | Admitting: Obstetrics

## 2023-08-27 ENCOUNTER — Ambulatory Visit: Payer: BC Managed Care – PPO | Admitting: Obstetrics

## 2023-08-27 VITALS — BP 122/84 | HR 61 | Ht 67.0 in | Wt 285.0 lb

## 2023-08-27 DIAGNOSIS — Z1239 Encounter for other screening for malignant neoplasm of breast: Secondary | ICD-10-CM

## 2023-08-27 DIAGNOSIS — Z6841 Body Mass Index (BMI) 40.0 and over, adult: Secondary | ICD-10-CM

## 2023-08-27 DIAGNOSIS — N898 Other specified noninflammatory disorders of vagina: Secondary | ICD-10-CM | POA: Diagnosis not present

## 2023-08-27 DIAGNOSIS — Z975 Presence of (intrauterine) contraceptive device: Secondary | ICD-10-CM

## 2023-08-27 DIAGNOSIS — N398 Other specified disorders of urinary system: Secondary | ICD-10-CM | POA: Diagnosis not present

## 2023-08-27 DIAGNOSIS — Z01419 Encounter for gynecological examination (general) (routine) without abnormal findings: Secondary | ICD-10-CM | POA: Insufficient documentation

## 2023-08-27 DIAGNOSIS — E139 Other specified diabetes mellitus without complications: Secondary | ICD-10-CM

## 2023-08-27 DIAGNOSIS — E66813 Obesity, class 3: Secondary | ICD-10-CM

## 2023-08-27 LAB — POCT URINALYSIS DIPSTICK
Bilirubin, UA: NEGATIVE
Glucose, UA: NEGATIVE
Ketones, UA: NEGATIVE
Nitrite, UA: NEGATIVE
Protein, UA: NEGATIVE
Spec Grav, UA: 1.02 (ref 1.010–1.025)
Urobilinogen, UA: 0.2 U/dL
pH, UA: 6 (ref 5.0–8.0)

## 2023-08-27 NOTE — Progress Notes (Signed)
 Pt would like to discuss some urinary concerns, pt feels she can't empty bladder.

## 2023-08-27 NOTE — Progress Notes (Signed)
 Subjective:        Cheryl Acosta is a 48 y.o. female here for a routine exam.  Current complaints:  Difficulty emptying bladder completely.  Denies incontinence, frequency or burning.  Also has vaginal discharge..    Personal health questionnaire:  Is patient Ashkenazi Jewish, have a family history of breast and/or ovarian cancer: no Is there a family history of uterine cancer diagnosed at age < 34, gastrointestinal cancer, urinary tract cancer, family member who is a Personnel Officer syndrome-associated carrier: no Is the patient overweight and hypertensive, family history of diabetes, personal history of gestational diabetes, preeclampsia or PCOS: no Is patient over 29, have PCOS,  family history of premature CHD under age 31, diabetes, smoke, have hypertension or peripheral artery disease:  no At any time, has a partner hit, kicked or otherwise hurt or frightened you?: no Over the past 2 weeks, have you felt down, depressed or hopeless?: no Over the past 2 weeks, have you felt little interest or pleasure in doing things?:no   Gynecologic History No LMP recorded. (Menstrual status: IUD). Contraception: IUD Last Pap: 2020. Results were: normal Last mammogram: 2019. Results were: abnormal screen.  Normal tomo.  Obstetric History OB History  Gravida Para Term Preterm AB Living  5 4 4  0 1 4  SAB IAB Ectopic Multiple Live Births  0 1 0 0 4    # Outcome Date GA Lbr Len/2nd Weight Sex Type Anes PTL Lv  5 Term 01/11/19 [redacted]w[redacted]d 06:55 / 00:10 7 lb 0.9 oz (3.201 kg) F Vag-Spont EPI  LIV  4 Term 04/30/17 [redacted]w[redacted]d 01:55 / 00:07 8 lb 1.3 oz (3.666 kg) M Vag-Spont EPI  LIV  3 IAB 12/12/15 [redacted]w[redacted]d    TAB  N FD     Birth Comments: baby had died; MD did D&E  2 Term 2008/01/29 [redacted]w[redacted]d  7 lb 11 oz (3.487 kg) F Vag-Spont None N LIV  1 Term 05/25/06 [redacted]w[redacted]d  7 lb 2 oz (3.232 kg) F Vag-Spont EPI N LIV    Past Medical History:  Diagnosis Date   Anemia    not on Iron    Asthma    on Symbicort inhaler; uses prn;  last used 04/28/17   Diabetes mellitus without complication (HCC)    Environmental allergies    GERD (gastroesophageal reflux disease)    during pregnancy only   Gestational diabetes    diet-controlled    Past Surgical History:  Procedure Laterality Date   BREAST SURGERY  2003   BILATERAL LUMPECTOMY    DILATION AND EVACUATION N/A 12/12/2015   Procedure: DILATATION AND EVACUATION;  Surgeon: Carlin DELENA Centers, MD;  Location: [facility];  Service: Gynecology;  Laterality: N/A;   THUMB FUSION       Current Outpatient Medications:    Liraglutide  -Weight Management (SAXENDA ) 18 MG/3ML SOPN, Inject 1.8 mg daily for 7-10 days, then increase to 2.4 mg daily for 7-10 days, then increase to 3 mg daily thereafter., Disp: 12 mL, Rfl: 2   albuterol  (PROVENTIL  HFA;VENTOLIN  HFA) 108 (90 Base) MCG/ACT inhaler, Inhale 2 puffs into the lungs every 6 (six) hours as needed for wheezing or shortness of breath. (Patient not taking: Reported on 02/23/2019), Disp: 1 Inhaler, Rfl: 2   cetirizine  (ZYRTEC ) 10 MG tablet, Take 1 tablet (10 mg total) by mouth daily as needed for allergies., Disp: 30 tablet, Rfl: 0   fluticasone  (FLOVENT  HFA) 110 MCG/ACT inhaler, Inhale 2 puffs into the lungs 2 (two) times daily. (Patient not taking: Reported  on 02/23/2019), Disp: 1 Inhaler, Rfl: 12   Insulin  Pen Needle 31G X 8 MM MISC, Use as Directed, Disp: 100 each, Rfl: 2   montelukast (SINGULAIR) 10 MG tablet, Take 10 mg by mouth at bedtime., Disp: , Rfl:    tirzepatide  (ZEPBOUND ) 2.5 MG/0.5ML Pen, Inject 2.5 mg into the skin once a week., Disp: 2 mL, Rfl: 0 No Known Allergies  Social History   Tobacco Use   Smoking status: Never   Smokeless tobacco: Never  Substance Use Topics   Alcohol use: No    Family History  Problem Relation Age of Onset   Diabetes Mother       Review of Systems  Constitutional: negative for fatigue and weight loss Respiratory: negative for cough and wheezing Cardiovascular: negative for chest pain,  fatigue and palpitations Gastrointestinal: negative for abdominal pain and change in bowel habits Musculoskeletal:negative for myalgias Neurological: negative for gait problems and tremors Behavioral/Psych: negative for abusive relationship, depression Endocrine: negative for temperature intolerance    Genitourinary: positive for vaginal discharge, and difficulty emptying bladder completely.  negative for abnormal menstrual periods, genital lesions, hot flashes, sexual problems  Integument/breast: negative for breast lump, breast tenderness, nipple discharge and skin lesion(s)    Objective:       BP 122/84   Pulse 61   Ht 5' 7 (1.702 m)   Wt 285 lb (129.3 kg)   BMI 44.64 kg/m  General:   Alert and no distress  Skin:   no rash or abnormalities  Lungs:   clear to auscultation bilaterally  Heart:   regular rate and rhythm, S1, S2 normal, no murmur, click, rub or gallop  Breasts:   normal without suspicious masses, skin or nipple changes or axillary nodes  Abdomen:  normal findings: no organomegaly, soft, non-tender and no hernia  Pelvis:  External genitalia: normal general appearance Urinary system: urethral meatus normal and bladder without fullness, nontender Vaginal: normal without tenderness, induration or masses Cervix: normal appearance Adnexa: normal bimanual exam Uterus: anteverted and non-tender, normal size   Lab Review Urine pregnancy test Labs reviewed yes Radiologic studies reviewed yes  I have spent a total of 20 minutes of face-to-face time, excluding clinical staff time, reviewing notes and preparing to see patient, ordering tests and/or medications, and counseling the patient.   Assessment:    1. Encounter for gynecological examination with Papanicolaou smear of cervix (Primary) Rx: - Cytology - PAP( [place])  2. IUD (intrauterine device) in place  3. Vaginal discharge Rx: - Cervicovaginal ancillary only( [place])  4. Voiding  dysfunction Rx: - Ambulatory referral to Urogynecology - POCT urinalysis dipstick - Urine Culture  5. Screening breast examination Rx: - MM Digital Screening; Future  6. 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 Class 3 severe obesity due to excess calories without serious comorbidity with body mass index (BMI) of 40.0 to 44.9 in adult (HCC) - weight reduction with the aid of dietary changes, exercise and behavioral modification recommended    Plan:    Education reviewed: calcium supplements, depression evaluation, low fat, low cholesterol diet, safe sex/STD prevention, self breast exams, and weight bearing exercise. Contraception: IUD. Mammogram ordered. Follow up in: 1 year.   No orders of the defined types were placed in this encounter.  Orders Placed This Encounter  Procedures   MM Digital Screening    Standing Status:   Future    Expiration Date:   08/26/2024  Reason for Exam (SYMPTOM  OR DIAGNOSIS REQUIRED):   Screening    Is the patient pregnant?:   No    Preferred imaging location?:   [facility]    CARLIN RONAL CENTERS, MD, FACOG Attending Obstetrician & Gynecologist, [facility] for [facility], [facility], [place] 08/27/2023

## 2023-08-28 ENCOUNTER — Ambulatory Visit: Payer: BC Managed Care – PPO | Admitting: Obstetrics

## 2023-08-28 LAB — CERVICOVAGINAL ANCILLARY ONLY
Bacterial Vaginitis (gardnerella): NEGATIVE
Candida Glabrata: NEGATIVE
Candida Vaginitis: NEGATIVE
Chlamydia: NEGATIVE
Comment: NEGATIVE
Comment: NEGATIVE
Comment: NEGATIVE
Comment: NEGATIVE
Comment: NEGATIVE
Comment: NORMAL
Neisseria Gonorrhea: NEGATIVE
Trichomonas: NEGATIVE

## 2023-08-29 LAB — URINE CULTURE: Organism ID, Bacteria: NO GROWTH

## 2023-08-30 LAB — CYTOLOGY - PAP
Comment: NEGATIVE
Diagnosis: NEGATIVE
High risk HPV: NEGATIVE

## 2023-09-02 ENCOUNTER — Encounter: Payer: Self-pay | Admitting: Family Medicine

## 2023-10-12 DIAGNOSIS — Z419 Encounter for procedure for purposes other than remedying health state, unspecified: Secondary | ICD-10-CM | POA: Diagnosis not present

## 2023-11-14 ENCOUNTER — Ambulatory Visit: Payer: BC Managed Care – PPO | Admitting: Obstetrics

## 2023-11-23 DIAGNOSIS — Z419 Encounter for procedure for purposes other than remedying health state, unspecified: Secondary | ICD-10-CM | POA: Diagnosis not present

## 2023-11-25 DIAGNOSIS — B3731 Acute candidiasis of vulva and vagina: Secondary | ICD-10-CM | POA: Diagnosis not present

## 2023-12-22 DIAGNOSIS — H538 Other visual disturbances: Secondary | ICD-10-CM | POA: Diagnosis not present

## 2023-12-22 DIAGNOSIS — R0789 Other chest pain: Secondary | ICD-10-CM | POA: Diagnosis not present

## 2023-12-22 DIAGNOSIS — R079 Chest pain, unspecified: Secondary | ICD-10-CM | POA: Diagnosis not present

## 2023-12-23 DIAGNOSIS — Z419 Encounter for procedure for purposes other than remedying health state, unspecified: Secondary | ICD-10-CM | POA: Diagnosis not present

## 2023-12-23 DIAGNOSIS — R079 Chest pain, unspecified: Secondary | ICD-10-CM | POA: Diagnosis not present

## 2024-01-23 DIAGNOSIS — Z419 Encounter for procedure for purposes other than remedying health state, unspecified: Secondary | ICD-10-CM | POA: Diagnosis not present

## 2024-02-22 DIAGNOSIS — Z419 Encounter for procedure for purposes other than remedying health state, unspecified: Secondary | ICD-10-CM | POA: Diagnosis not present

## 2024-02-25 DIAGNOSIS — M545 Low back pain, unspecified: Secondary | ICD-10-CM | POA: Diagnosis not present

## 2024-02-25 DIAGNOSIS — E279 Disorder of adrenal gland, unspecified: Secondary | ICD-10-CM | POA: Diagnosis not present

## 2024-02-25 DIAGNOSIS — F411 Generalized anxiety disorder: Secondary | ICD-10-CM | POA: Diagnosis not present

## 2024-02-25 DIAGNOSIS — G8929 Other chronic pain: Secondary | ICD-10-CM | POA: Diagnosis not present

## 2024-02-25 DIAGNOSIS — Z6841 Body Mass Index (BMI) 40.0 and over, adult: Secondary | ICD-10-CM | POA: Diagnosis not present

## 2024-03-24 DIAGNOSIS — Z419 Encounter for procedure for purposes other than remedying health state, unspecified: Secondary | ICD-10-CM | POA: Diagnosis not present

## 2024-04-08 DIAGNOSIS — E279 Disorder of adrenal gland, unspecified: Secondary | ICD-10-CM | POA: Diagnosis not present

## 2024-04-24 DIAGNOSIS — Z419 Encounter for procedure for purposes other than remedying health state, unspecified: Secondary | ICD-10-CM | POA: Diagnosis not present

## 2024-06-12 DIAGNOSIS — R1900 Intra-abdominal and pelvic swelling, mass and lump, unspecified site: Secondary | ICD-10-CM | POA: Diagnosis not present

## 2024-06-29 DIAGNOSIS — G8929 Other chronic pain: Secondary | ICD-10-CM | POA: Diagnosis not present

## 2024-06-29 DIAGNOSIS — N6459 Other signs and symptoms in breast: Secondary | ICD-10-CM | POA: Diagnosis not present

## 2024-06-29 DIAGNOSIS — Z1211 Encounter for screening for malignant neoplasm of colon: Secondary | ICD-10-CM | POA: Diagnosis not present

## 2024-06-29 DIAGNOSIS — Z6841 Body Mass Index (BMI) 40.0 and over, adult: Secondary | ICD-10-CM | POA: Diagnosis not present

## 2024-06-29 DIAGNOSIS — F411 Generalized anxiety disorder: Secondary | ICD-10-CM | POA: Diagnosis not present

## 2024-06-29 DIAGNOSIS — M545 Low back pain, unspecified: Secondary | ICD-10-CM | POA: Diagnosis not present

## 2024-08-24 ENCOUNTER — Encounter: Payer: Self-pay | Admitting: *Deleted
